# Patient Record
Sex: Female | Born: 1935 | Race: White | Hispanic: No | Marital: Married | Smoking: Never smoker
Health system: Southern US, Community
[De-identification: ages and names within clinical notes are randomized; demographics above are authoritative.]

## PROBLEM LIST (undated history)

## (undated) DIAGNOSIS — K5730 Diverticulosis of large intestine without perforation or abscess without bleeding: Secondary | ICD-10-CM

## (undated) DIAGNOSIS — M1990 Unspecified osteoarthritis, unspecified site: Secondary | ICD-10-CM

## (undated) DIAGNOSIS — S42402A Unspecified fracture of lower end of left humerus, initial encounter for closed fracture: Secondary | ICD-10-CM

## (undated) DIAGNOSIS — Z96652 Presence of left artificial knee joint: Secondary | ICD-10-CM

## (undated) DIAGNOSIS — L309 Dermatitis, unspecified: Secondary | ICD-10-CM

## (undated) DIAGNOSIS — I10 Essential (primary) hypertension: Secondary | ICD-10-CM

## (undated) DIAGNOSIS — S52022A Displaced fracture of olecranon process without intraarticular extension of left ulna, initial encounter for closed fracture: Secondary | ICD-10-CM

## (undated) DIAGNOSIS — M1712 Unilateral primary osteoarthritis, left knee: Secondary | ICD-10-CM

## (undated) HISTORY — DX: Essential (primary) hypertension: I10

## (undated) HISTORY — DX: Dermatitis, unspecified: L30.9

## (undated) HISTORY — DX: Presence of left artificial knee joint: Z96.652

## (undated) HISTORY — DX: Diverticulosis of large intestine without perforation or abscess without bleeding: K57.30

## (undated) HISTORY — DX: Unspecified osteoarthritis, unspecified site: M19.90

---

## 1978-04-01 HISTORY — PX: APPENDECTOMY: SHX54

## 1978-04-01 HISTORY — PX: ABDOMINAL HYSTERECTOMY: SHX81

## 1999-04-02 HISTORY — PX: KNEE ARTHROSCOPY: SHX127

## 2002-06-28 ENCOUNTER — Encounter: Payer: Self-pay | Admitting: Internal Medicine

## 2003-06-13 ENCOUNTER — Encounter: Payer: Self-pay | Admitting: Internal Medicine

## 2004-04-25 ENCOUNTER — Ambulatory Visit: Payer: Self-pay | Admitting: Internal Medicine

## 2005-05-16 ENCOUNTER — Ambulatory Visit: Payer: Self-pay | Admitting: Internal Medicine

## 2005-07-17 ENCOUNTER — Encounter: Payer: Self-pay | Admitting: Internal Medicine

## 2005-09-11 ENCOUNTER — Ambulatory Visit: Payer: Self-pay | Admitting: Internal Medicine

## 2006-04-23 ENCOUNTER — Ambulatory Visit: Payer: Self-pay | Admitting: Gastroenterology

## 2006-06-10 ENCOUNTER — Ambulatory Visit: Payer: Self-pay | Admitting: Gastroenterology

## 2006-06-10 ENCOUNTER — Encounter: Payer: Self-pay | Admitting: Internal Medicine

## 2006-06-10 LAB — HM COLONOSCOPY: HM Colonoscopy: NORMAL

## 2006-06-17 ENCOUNTER — Ambulatory Visit: Payer: Self-pay | Admitting: Internal Medicine

## 2006-09-16 ENCOUNTER — Ambulatory Visit: Payer: Self-pay | Admitting: Internal Medicine

## 2006-09-16 LAB — CONVERTED CEMR LAB
ALT: 21 units/L (ref 0–40)
AST: 26 units/L (ref 0–37)
Albumin: 4.1 g/dL (ref 3.5–5.2)
Alkaline Phosphatase: 43 units/L (ref 39–117)
BUN: 14 mg/dL (ref 6–23)
Basophils Absolute: 0 10*3/uL (ref 0.0–0.1)
Basophils Relative: 0.3 % (ref 0.0–1.0)
Bilirubin, Direct: 0.1 mg/dL (ref 0.0–0.3)
CO2: 33 meq/L — ABNORMAL HIGH (ref 19–32)
Calcium: 9.4 mg/dL (ref 8.4–10.5)
Chloride: 107 meq/L (ref 96–112)
Cholesterol: 256 mg/dL (ref 0–200)
Creatinine, Ser: 0.7 mg/dL (ref 0.4–1.2)
Direct LDL: 163 mg/dL
Eosinophils Absolute: 0.2 10*3/uL (ref 0.0–0.6)
Eosinophils Relative: 3 % (ref 0.0–5.0)
GFR calc Af Amer: 106 mL/min
GFR calc non Af Amer: 88 mL/min
Glucose, Bld: 91 mg/dL (ref 70–99)
HCT: 38.7 % (ref 36.0–46.0)
HDL: 69.2 mg/dL (ref >39.0)
Hemoglobin: 13.5 g/dL (ref 12.0–15.0)
Lymphocytes Relative: 34.3 % (ref 12.0–46.0)
MCHC: 34.8 g/dL (ref 30.0–36.0)
MCV: 93.3 fL (ref 78.0–100.0)
Monocytes Absolute: 0.5 10*3/uL (ref 0.2–0.7)
Monocytes Relative: 8.4 % (ref 3.0–11.0)
Neutro Abs: 3.2 10*3/uL (ref 1.4–7.7)
Neutrophils Relative %: 54 % (ref 43.0–77.0)
Platelets: 274 10*3/uL (ref 150–400)
Potassium: 3.8 meq/L (ref 3.5–5.1)
RBC: 4.15 M/uL (ref 3.87–5.11)
RDW: 13.5 % (ref 11.5–14.6)
Sodium: 143 meq/L (ref 135–145)
TSH: 1.14 microintl units/mL (ref 0.35–5.50)
Total Bilirubin: 0.4 mg/dL (ref 0.3–1.2)
Total CHOL/HDL Ratio: 3.7
Total Protein: 6.5 g/dL (ref 6.0–8.3)
Triglycerides: 100 mg/dL (ref 0–149)
VLDL: 20 mg/dL (ref 0–40)
Vit D, 1,25-Dihydroxy: 25 (ref 20–57)
WBC: 5.9 10*3/uL (ref 4.5–10.5)

## 2006-09-17 DIAGNOSIS — K5730 Diverticulosis of large intestine without perforation or abscess without bleeding: Secondary | ICD-10-CM | POA: Insufficient documentation

## 2006-11-18 ENCOUNTER — Ambulatory Visit: Payer: Self-pay | Admitting: Internal Medicine

## 2007-03-10 ENCOUNTER — Ambulatory Visit: Payer: Self-pay | Admitting: Internal Medicine

## 2007-03-10 LAB — CONVERTED CEMR LAB
Bilirubin Urine: NEGATIVE
Glucose, Urine, Semiquant: NEGATIVE
Ketones, urine, test strip: NEGATIVE
Nitrite: NEGATIVE
Specific Gravity, Urine: >=1.03
Urobilinogen, UA: 0.2
pH: 5

## 2007-03-11 ENCOUNTER — Encounter: Payer: Self-pay | Admitting: Internal Medicine

## 2007-04-01 ENCOUNTER — Telehealth: Payer: Self-pay | Admitting: Internal Medicine

## 2007-07-22 ENCOUNTER — Encounter: Payer: Self-pay | Admitting: Internal Medicine

## 2007-07-28 ENCOUNTER — Encounter: Payer: Self-pay | Admitting: Internal Medicine

## 2007-09-08 ENCOUNTER — Ambulatory Visit: Payer: Self-pay | Admitting: Internal Medicine

## 2007-09-08 LAB — CONVERTED CEMR LAB
Nitrite: POSITIVE
Protein, U semiquant: 100
Urobilinogen, UA: NEGATIVE
pH: 5

## 2007-09-24 ENCOUNTER — Ambulatory Visit: Payer: Self-pay | Admitting: Internal Medicine

## 2007-09-24 LAB — CONVERTED CEMR LAB
Blood in Urine, dipstick: NEGATIVE
Glucose, Urine, Semiquant: NEGATIVE
Ketones, urine, test strip: NEGATIVE
Protein, U semiquant: NEGATIVE
Urobilinogen, UA: 0.2

## 2007-09-25 LAB — CONVERTED CEMR LAB
ALT: 31 units/L (ref 0–35)
AST: 33 units/L (ref 0–37)
Albumin: 4 g/dL (ref 3.5–5.2)
Alkaline Phosphatase: 48 units/L (ref 39–117)
BUN: 16 mg/dL (ref 6–23)
Basophils Relative: 0.9 % (ref 0.0–1.0)
Bilirubin, Direct: 0.1 mg/dL (ref 0.0–0.3)
Creatinine, Ser: 0.8 mg/dL (ref 0.4–1.2)
Eosinophils Absolute: 0.2 10*3/uL (ref 0.0–0.7)
GFR calc Af Amer: 91 mL/min
GFR calc non Af Amer: 75 mL/min
Glucose, Bld: 90 mg/dL (ref 70–99)
Hemoglobin: 14 g/dL (ref 12.0–15.0)
Lymphocytes Relative: 33 % (ref 12.0–46.0)
Monocytes Relative: 8.6 % (ref 3.0–12.0)
Neutro Abs: 2.9 10*3/uL (ref 1.4–7.7)
Neutrophils Relative %: 53.7 % (ref 43.0–77.0)
RBC: 4.29 M/uL (ref 3.87–5.11)
Sodium: 140 meq/L (ref 135–145)
Total CHOL/HDL Ratio: 3.8
Triglycerides: 72 mg/dL (ref 0–149)
VLDL: 14 mg/dL (ref 0–40)

## 2007-09-28 ENCOUNTER — Telehealth: Payer: Self-pay | Admitting: Internal Medicine

## 2007-11-17 ENCOUNTER — Telehealth: Payer: Self-pay | Admitting: Internal Medicine

## 2007-12-14 ENCOUNTER — Encounter: Payer: Self-pay | Admitting: Internal Medicine

## 2008-02-16 ENCOUNTER — Ambulatory Visit: Payer: Self-pay | Admitting: Family Medicine

## 2008-03-01 ENCOUNTER — Telehealth: Payer: Self-pay | Admitting: Internal Medicine

## 2008-03-29 ENCOUNTER — Ambulatory Visit: Payer: Self-pay | Admitting: Internal Medicine

## 2008-03-29 DIAGNOSIS — M129 Arthropathy, unspecified: Secondary | ICD-10-CM | POA: Insufficient documentation

## 2008-03-29 LAB — CONVERTED CEMR LAB
Bilirubin Urine: NEGATIVE
Glucose, Urine, Semiquant: NEGATIVE
Protein, U semiquant: NEGATIVE
Urobilinogen, UA: 0.2

## 2008-04-01 HISTORY — PX: OTHER SURGICAL HISTORY: SHX169

## 2008-04-18 ENCOUNTER — Telehealth: Payer: Self-pay | Admitting: Internal Medicine

## 2008-07-12 ENCOUNTER — Telehealth (INDEPENDENT_AMBULATORY_CARE_PROVIDER_SITE_OTHER): Payer: Self-pay | Admitting: *Deleted

## 2008-08-17 ENCOUNTER — Encounter: Payer: Self-pay | Admitting: Internal Medicine

## 2008-09-15 ENCOUNTER — Telehealth (INDEPENDENT_AMBULATORY_CARE_PROVIDER_SITE_OTHER): Payer: Self-pay | Admitting: *Deleted

## 2008-09-16 ENCOUNTER — Ambulatory Visit: Payer: Self-pay | Admitting: Internal Medicine

## 2008-09-16 LAB — CONVERTED CEMR LAB
Albumin: 4.2 g/dL (ref 3.5–5.2)
Alkaline Phosphatase: 57 units/L (ref 39–117)
BUN: 17 mg/dL (ref 6–23)
Basophils Absolute: 0.1 10*3/uL (ref 0.0–0.1)
Basophils Relative: 1.7 % (ref 0.0–3.0)
Bilirubin, Direct: 0.2 mg/dL (ref 0.0–0.3)
CO2: 31 meq/L (ref 19–32)
Calcium: 9.4 mg/dL (ref 8.4–10.5)
Chloride: 104 meq/L (ref 96–112)
Direct LDL: 170.4 mg/dL
Eosinophils Absolute: 0.2 10*3/uL (ref 0.0–0.7)
Eosinophils Relative: 3.1 % (ref 0.0–5.0)
Glucose, Bld: 99 mg/dL (ref 70–99)
HCT: 40.7 % (ref 36.0–46.0)
Hemoglobin, Urine: NEGATIVE
Hemoglobin: 13.8 g/dL (ref 12.0–15.0)
MCV: 95.4 fL (ref 78.0–100.0)
Monocytes Absolute: 0.3 10*3/uL (ref 0.1–1.0)
Monocytes Relative: 5.3 % (ref 3.0–12.0)
Neutro Abs: 2.6 10*3/uL (ref 1.4–7.7)
Platelets: 254 10*3/uL (ref 150.0–400.0)
RBC: 4.27 M/uL (ref 3.87–5.11)
Sodium: 141 meq/L (ref 135–145)
TSH: 1.61 microintl units/mL (ref 0.35–5.50)
Total Bilirubin: 1.2 mg/dL (ref 0.3–1.2)
Urine Glucose: NEGATIVE mg/dL
pH: 6 (ref 5.0–8.0)

## 2008-09-23 ENCOUNTER — Ambulatory Visit: Payer: Self-pay | Admitting: Internal Medicine

## 2008-12-15 ENCOUNTER — Telehealth: Payer: Self-pay | Admitting: Internal Medicine

## 2008-12-20 ENCOUNTER — Ambulatory Visit: Payer: Self-pay | Admitting: Internal Medicine

## 2009-02-15 ENCOUNTER — Ambulatory Visit: Payer: Self-pay | Admitting: Internal Medicine

## 2009-02-15 LAB — CONVERTED CEMR LAB
Bilirubin Urine: NEGATIVE
Nitrite: NEGATIVE
Protein, U semiquant: NEGATIVE
Urobilinogen, UA: 0.2
pH: 5

## 2009-02-21 ENCOUNTER — Telehealth: Payer: Self-pay | Admitting: Internal Medicine

## 2009-02-21 ENCOUNTER — Ambulatory Visit: Payer: Self-pay | Admitting: Internal Medicine

## 2009-02-22 ENCOUNTER — Encounter: Payer: Self-pay | Admitting: Internal Medicine

## 2009-06-14 ENCOUNTER — Telehealth (INDEPENDENT_AMBULATORY_CARE_PROVIDER_SITE_OTHER): Payer: Self-pay | Admitting: *Deleted

## 2009-06-22 ENCOUNTER — Ambulatory Visit: Payer: Self-pay | Admitting: Family Medicine

## 2009-08-01 ENCOUNTER — Ambulatory Visit: Payer: Self-pay | Admitting: Internal Medicine

## 2009-08-01 DIAGNOSIS — I10 Essential (primary) hypertension: Secondary | ICD-10-CM | POA: Insufficient documentation

## 2009-08-22 ENCOUNTER — Encounter: Payer: Self-pay | Admitting: Internal Medicine

## 2009-10-05 ENCOUNTER — Ambulatory Visit: Payer: Self-pay | Admitting: Internal Medicine

## 2009-10-05 LAB — CONVERTED CEMR LAB
AST: 29 units/L (ref 0–37)
Albumin: 4.4 g/dL (ref 3.5–5.2)
Alkaline Phosphatase: 60 units/L (ref 39–117)
Basophils Relative: 0.8 % (ref 0.0–3.0)
Bilirubin Urine: NEGATIVE
Blood in Urine, dipstick: NEGATIVE
Calcium: 9.6 mg/dL (ref 8.4–10.5)
Creatinine, Ser: 0.7 mg/dL (ref 0.4–1.2)
Direct LDL: 170.9 mg/dL
Eosinophils Absolute: 0.3 10*3/uL (ref 0.0–0.7)
Eosinophils Relative: 6.1 % — ABNORMAL HIGH (ref 0.0–5.0)
Glucose, Urine, Semiquant: NEGATIVE
HCT: 39.3 % (ref 36.0–46.0)
HDL: 63.6 mg/dL (ref >39.00)
Ketones, urine, test strip: NEGATIVE
Lymphocytes Relative: 33.5 % (ref 12.0–46.0)
MCV: 97.5 fL (ref 78.0–100.0)
Monocytes Absolute: 0.5 10*3/uL (ref 0.1–1.0)
Neutro Abs: 2.8 10*3/uL (ref 1.4–7.7)
Nitrite: NEGATIVE
Potassium: 4.8 meq/L (ref 3.5–5.1)
Protein, U semiquant: NEGATIVE
RDW: 14.3 % (ref 11.5–14.6)
Sodium: 143 meq/L (ref 135–145)
Specific Gravity, Urine: 1.015
TSH: 1.64 microintl units/mL (ref 0.35–5.50)
Total Bilirubin: 0.7 mg/dL (ref 0.3–1.2)
Total CHOL/HDL Ratio: 4
Urobilinogen, UA: 0.2
WBC: 5.6 10*3/uL (ref 4.5–10.5)
pH: 5.5

## 2009-10-12 ENCOUNTER — Ambulatory Visit: Payer: Self-pay | Admitting: Internal Medicine

## 2009-10-25 ENCOUNTER — Encounter: Admission: RE | Admit: 2009-10-25 | Discharge: 2009-10-25 | Payer: Self-pay | Admitting: Orthopedic Surgery

## 2009-10-30 ENCOUNTER — Ambulatory Visit (HOSPITAL_BASED_OUTPATIENT_CLINIC_OR_DEPARTMENT_OTHER): Admission: RE | Admit: 2009-10-30 | Discharge: 2009-10-30 | Payer: Self-pay | Admitting: Orthopedic Surgery

## 2010-02-07 ENCOUNTER — Ambulatory Visit: Payer: Self-pay | Admitting: Internal Medicine

## 2010-02-12 ENCOUNTER — Encounter: Payer: Self-pay | Admitting: Internal Medicine

## 2010-04-12 ENCOUNTER — Ambulatory Visit
Admission: RE | Admit: 2010-04-12 | Discharge: 2010-04-12 | Payer: Self-pay | Source: Home / Self Care | Attending: Internal Medicine | Admitting: Internal Medicine

## 2010-04-12 DIAGNOSIS — R300 Dysuria: Secondary | ICD-10-CM | POA: Insufficient documentation

## 2010-04-13 ENCOUNTER — Encounter: Payer: Self-pay | Admitting: Internal Medicine

## 2010-05-01 NOTE — Progress Notes (Signed)
  Phone Note Call from Patient   Summary of Call: pt wants to know if the life line screening is necessary or is it included with cpx from md - when was her last cpx- ( it was 6-10) Initial call taken by: Willy Eddy, LPN,  June 14, 2009 9:09 AM  Follow-up for Phone Call        Per Dr. Lovell Sheehan.  Does not like this service.  Tests are not reliable, and they have to be repeated if postive anyway.  Pt notified. Follow-up by: Lynann Beaver CMA,  June 14, 2009 9:37 AM

## 2010-05-01 NOTE — Assessment & Plan Note (Signed)
Summary: flu shot/njr   Nurse Visit   Allergies: 1)  * Adhesive Oxytrol  Orders Added: 1)  Flu Vaccine 64yrs + MEDICARE PATIENTS [Q2039] 2)  Administration Flu vaccine - MCR [G0008] Flu Vaccine Consent Questions     Do you have a history of severe allergic reactions to this vaccine? no    Any prior history of allergic reactions to egg and/or gelatin? no    Do you have a sensitivity to the preservative Thimersol? no    Do you have a past history of Guillan-Barre Syndrome? no    Do you currently have an acute febrile illness? no    Have you ever had a severe reaction to latex? no    Vaccine information given and explained to patient? yes    Are you currently pregnant? no    Lot Number:[id number]   Exp Date:09/29/2010   Site Given  Left Deltoid [id number]

## 2010-05-01 NOTE — Assessment & Plan Note (Signed)
Summary: F/U ON HTN // RS   Vital Signs:  Patient profile:   77 year old female Weight:      160 pounds BMI:     26.32 Temp:     97.9 degrees F oral Pulse rate:   72 / minute Pulse rhythm:   regular Resp:     12 per minute BP sitting:   158 / 80  (left arm) Cuff size:   regular  Vitals Entered By: Gladis Riffle, RN (Aug 01, 2009 10:35 AM) CC: FU HTN--does not check at home Is Patient Diabetic? No   CC:  FU HTN--does not check at home.  History of Present Illness: not monitoring BP she has no compliants no meds  had knee injection this a.m. (knee pain)  All other systems reviewed and were negative   Preventive Screening-Counseling & Management  Alcohol-Tobacco     Alcohol drinks/day: <1     Smoking Status: never  Current Medications (verified): 1)  Aspir-81 81 Mg Tbec (Aspirin) .... Take 1 Tablet By Mouth Once A Day 2)  Vesicare 5 Mg Tabs (Solifenacin Succinate) .Marland Kitchen.. 1 By Mouth Once Daily 3)  Methotrexate 2.5 Mg Tabs (Methotrexate Sodium) .... Take 2 Tablet By Mouth Three Times A Week 4)  Nasacort Aq 55 Mcg/act Aers (Triamcinolone Acetonide(Nasal)) .... Prn 5)  Premarin 0.3 Mg Tabs (Estrogens Conjugated) .Marland Kitchen.. 1 By Mouth Once Daily 6)  Naproxen Dr 375 Mg  Tbec (Naproxen) .Marland Kitchen.. 1 By Mouth Once Daily As Needed 7)  Omeprazole 40 Mg Cpdr (Omeprazole) .... Take 1 Tablet By Mouth Once A Day 8)  Caltrate 600+d 600-400 Mg-Unit Tabs (Calcium Carbonate-Vitamin D) .... Two Times A Day  Allergies: 1)  * Adhesive Oxytrol  Past History:  Past Surgical History: Last updated: 09/17/2006 Appendectomy 1980 Hysterectomy, BSO  1980 arthroscopy L 2005  Family History: Last updated: 11/18/2006 father---MI---82 yo mother MI---90 yo  Social History: Last updated: 03/29/2008 Married Never Smoked Alcohol use-yes Regular exercise-yes    Risk Factors: Alcohol Use: <1 (08/01/2009) Exercise: yes (11/18/2006)  Risk Factors: Smoking Status: never (08/01/2009)  Past Medical  History: Diverticulosis, colon eczema  psoriasis  on MTX  OA  Hypertension (dx 2011)  Review of Systems       All other systems reviewed and were negative   Physical Exam  General:  alert and well-developed.   Head:  normocephalic and atraumatic.   Eyes:  pupils equal and pupils round.   Neck:  No deformities, masses, or tenderness noted. Lungs:  Normal respiratory effort, chest expands symmetrically. Lungs are clear to auscultation, no crackles or wheezes. Heart:  normal rate and regular rhythm.   Abdomen:  no cva or suprapubic discomfort Msk:  No deformity or scoliosis noted of thoracic or lumbar spine.   Neurologic:  cranial nerves II-XII intact and gait normal.     Impression & Recommendations:  Problem # 1:  ELEVATED BLOOD PRESSURE (ICD-796.2)  no meds currently start lisinopril  side effects discussed BP today: 158/80 Prior BP: 160/80 (06/22/2009)  Labs Reviewed: Creat: 0.7 (09/16/2008) Chol: 242 (09/16/2008)   HDL: 64.50 (09/16/2008)   LDL: DEL (09/24/2007)   TG: 56.0 (09/16/2008)  Instructed in low sodium diet (DASH Handout) and behavior modification.    Her updated medication list for this problem includes:    Lisinopril 10 Mg Tabs (Lisinopril) .Marland Kitchen... Take 1 tab by mouth daily  Complete Medication List: 1)  Aspir-81 81 Mg Tbec (Aspirin) .... Take 1 tablet by mouth once a day 2)  Vesicare 5 Mg Tabs (Solifenacin succinate) .Marland Kitchen.. 1 by mouth once daily 3)  Methotrexate 2.5 Mg Tabs (Methotrexate sodium) .... Take 2 tablet by mouth three times a week 4)  Nasacort Aq 55 Mcg/act Aers (Triamcinolone acetonide(nasal)) .... Prn 5)  Premarin 0.3 Mg Tabs (Estrogens conjugated) .Marland Kitchen.. 1 by mouth once daily 6)  Naproxen Dr 375 Mg Tbec (Naproxen) .Marland Kitchen.. 1 by mouth once daily as needed 7)  Omeprazole 40 Mg Cpdr (Omeprazole) .... Take 1 tablet by mouth once a day 8)  Caltrate 600+d 600-400 Mg-unit Tabs (Calcium carbonate-vitamin d) .... Two times a day 9)  Lisinopril 10 Mg Tabs  (Lisinopril) .... Take 1 tab by mouth daily  Patient Instructions: 1)  see me mid-july--CPX Prescriptions: LISINOPRIL 10 MG  TABS (LISINOPRIL) Take 1 tab by mouth daily  #90 x 3   Entered and Authorized by:   Birdie Sons MD   Signed by:   Birdie Sons MD on 08/01/2009   Method used:   Electronically to        [facility] [id number] (retail)       [address] [place], [place]  [address]       Ph: [phone]       Fax: [phone]   RxID:   [id number]   Appended Document: F/U ON HTN // RS pt has new dx HTN

## 2010-05-01 NOTE — Assessment & Plan Note (Signed)
Summary: 30 min ov/njr   Vital Signs:  Patient profile:   77 year old female Height:      65.5 inches (166.37 cm) Weight:      157.31 pounds (71.50 kg) Temp:     98.3 degrees F (36.83 degrees C) oral Pulse rate:   76 / minute BP sitting:   142 / 82  (left arm) Cuff size:   regular  Vitals Entered By: Josph Macho RMA (October 12, 2009 9:28 AM) CC: Office visit/ CF Is Patient Diabetic? No   CC:  Office visit/ CF.  History of Present Illness:  Follow-Up Visit      This is a 77 year old woman who presents for Follow-up visit.  The patient denies chest pain and palpitations.  Since the last visit the patient notes no new problems or concerns (except eval for right knee pain---[place]) and no problems with medications.  The patient reports taking meds as prescribed.  When questioned about possible medication side effects, the patient notes none.    All other systems reviewed and were negative   Current Problems (verified): 1)  Hypertension  (ICD-401.9) 2)  Elevated Blood Pressure  (ICD-796.2) 3)  Vitamin D Deficiency  (ICD-268.9) 4)  Arthritis  (ICD-716.90) 5)  Psoriasis On Mtx  (ICD-696.1) 6)  Insomnia-sleep Disorder-unspec  (ICD-780.52) 7)  Preventive Health Care  (ICD-V70.0) 8)  Diverticulosis, Colon  (ICD-562.10)  Current Medications (verified): 1)  Aspir-81 81 Mg Tbec (Aspirin) .... Take 1 Tablet By Mouth Once A Day 2)  Vesicare 5 Mg Tabs (Solifenacin Succinate) .Marland Kitchen.. 1 By Mouth Once Daily 3)  Methotrexate 2.5 Mg Tabs (Methotrexate Sodium) .... Take 2 Tablet By Mouth Three Times A Week 4)  Nasacort Aq 55 Mcg/act Aers (Triamcinolone Acetonide(Nasal)) .... Prn 5)  Premarin 0.3 Mg Tabs (Estrogens Conjugated) .Marland Kitchen.. 1 By Mouth Once Daily 6)  Naproxen Dr 375 Mg  Tbec (Naproxen) .Marland Kitchen.. 1 By Mouth Once Daily As Needed 7)  Omeprazole 40 Mg Cpdr (Omeprazole) .... Take 1 Tablet By Mouth Once A Day 8)  Caltrate 600+d 600-400 Mg-Unit Tabs (Calcium Carbonate-Vitamin D) .... Two Times A  Day 9)  Lisinopril 10 Mg  Tabs (Lisinopril) .... Take 1 Tab By Mouth Daily  Allergies (verified): 1)  * Adhesive Oxytrol  Past History:  Past Medical History: Last updated: 08/01/2009 Diverticulosis, colon eczema  psoriasis  on MTX  OA  Hypertension (dx 2011)  Past Surgical History: Last updated: 09/17/2006 Appendectomy 1980 Hysterectomy, BSO  1980 arthroscopy L 2005  Family History: Last updated: 11/18/2006 father---MI---82 yo mother MI---90 yo  Social History: Last updated: 03/29/2008 Married Never Smoked Alcohol use-yes Regular exercise-yes    Risk Factors: Alcohol Use: <1 (08/01/2009) Exercise: yes (11/18/2006)  Risk Factors: Smoking Status: never (08/01/2009)  Physical Exam  General:  alert and well-developed.   Head:  normocephalic and atraumatic.   Eyes:  pupils equal and pupils round.   Ears:  R ear normal and L ear normal.   Neck:  No deformities, masses, or tenderness noted. Lungs:  normal respiratory effort and no intercostal retractions.   Heart:  normal rate and regular rhythm.   Abdomen:  no cva or suprapubic discomfort Msk:  No deformity or scoliosis noted of thoracic or lumbar spine.   Neurologic:  cranial nerves II-XII intact and gait normal.     Impression & Recommendations:  Problem # 1:  HYPERTENSION (ICD-401.9) adequate control continue current medications  Her updated medication list for this problem includes:    Lisinopril  10 Mg Tabs (Lisinopril) .Marland Kitchen... Take 1 tab by mouth daily  BP today: 142/82 Prior BP: 158/80 (08/01/2009)  Labs Reviewed: K+: 4.8 (10/05/2009) Creat: : 0.7 (10/05/2009)   Chol: 250 (10/05/2009)   HDL: 63.60 (10/05/2009)   LDL: DEL (09/24/2007)   TG: 115.0 (10/05/2009)  Problem # 2:  VITAMIN D DEFICIENCY (ICD-268.9)  Problem # 3:  PSORIASIS ON MTX (ICD-696.1) follwed by dermatology (frank [place])  Problem # 4:  ARTHRITIS (ICD-716.90) stable  Complete Medication List: 1)  Aspir-81 81 Mg Tbec  (Aspirin) .... Take 1 tablet by mouth once a day 2)  Vesicare 5 Mg Tabs (Solifenacin succinate) .Marland Kitchen.. 1 by mouth once daily 3)  Methotrexate 2.5 Mg Tabs (Methotrexate sodium) .... Take 2 tablet by mouth three times a week 4)  Nasacort Aq 55 Mcg/act Aers (Triamcinolone acetonide(nasal)) .... Prn 5)  Premarin 0.3 Mg Tabs (Estrogens conjugated) .Marland Kitchen.. 1 by mouth once daily 6)  Naproxen Dr 375 Mg Tbec (Naproxen) .Marland Kitchen.. 1 by mouth once daily as needed 7)  Omeprazole 40 Mg Cpdr (Omeprazole) .... Take 1 tablet by mouth once a day 8)  Caltrate 600+d 600-400 Mg-unit Tabs (Calcium carbonate-vitamin d) .... Two times a day 9)  Lisinopril 10 Mg Tabs (Lisinopril) .... Take 1 tab by mouth daily  Patient Instructions: 1)  Please schedule a follow-up appointment in 6 months.

## 2010-05-01 NOTE — Assessment & Plan Note (Signed)
Summary: possible ear infection - rv   Vital Signs:  Patient profile:   77 year old female Temp:     98.2 degrees F oral BP sitting:   160 / 80  (left arm) Cuff size:   regular  Vitals Entered By: Sid Falcon LPN (June 22, 2009 4:06 PM) CC: Possible right ear infection   History of Present Illness: R ear drainage for about 6 days.  Yellow tinged,  no fever.  T tube about 2 years ago and this was eventually removed.  Mild nasal congestion.  No vertigo or dizziness.  Hx mildly elev BP-never treated.  No recent headaches, palpitations, chest pain.  Allergies: 1)  * Adhesive Oxytrol  Past History:  Past Medical History: Last updated: 03/29/2008 Diverticulosis, colon eczema  psoriasis  on MTX  OA  PMH reviewed for relevance  Review of Systems  The patient denies anorexia, fever, chest pain, syncope, dyspnea on exertion, peripheral edema, and headaches.         chronic hearing loss with hearing aids.  Physical Exam  General:  Well-developed,well-nourished,in no acute distress; alert,appropriate and cooperative throughout examination Head:  Normocephalic and atraumatic without obvious abnormalities. No apparent alopecia or balding. Ears:  yellow tinged mostly thin drainage R canal with evidence for ?chronic perforation.  Distorted landmarks.  L TM is normal. Nose:  External nasal examination shows no deformity or inflammation. Nasal mucosa are pink and moist without lesions or exudates. Mouth:  Oral mucosa and oropharynx without lesions or exudates.  Teeth in good repair. Neck:  No deformities, masses, or tenderness noted. Lungs:  Normal respiratory effort, chest expands symmetrically. Lungs are clear to auscultation, no crackles or wheezes.   Impression & Recommendations:  Problem # 1:  OTITIS MEDIA, PURULENT, ACUTE (ICD-382.00) keep ear dry.  Start abs.  Pt has f/u with ENT next week. Her updated medication list for this problem includes:    Aspir-81 81 Mg Tbec  (Aspirin) .Marland Kitchen... Take 1 tablet by mouth once a day    Naproxen Dr 375 Mg Tbec (Naproxen) .Marland Kitchen... 1 by mouth once daily as needed    Amoxicillin-pot Clavulanate 875-125 Mg Tabs (Amoxicillin-pot clavulanate) ..... One by mouth two times a day with food  Problem # 2:  ELEVATED BLOOD PRESSURE (ICD-796.2) schedule f/u with primary.  Complete Medication List: 1)  Aspir-81 81 Mg Tbec (Aspirin) .... Take 1 tablet by mouth once a day 2)  Vesicare 5 Mg Tabs (Solifenacin succinate) .Marland Kitchen.. 1 by mouth once daily 3)  Methotrexate 2.5 Mg Tabs (Methotrexate sodium) .... Take 2 tablet by mouth three times a week 4)  Nasacort Aq 55 Mcg/act Aers (Triamcinolone acetonide(nasal)) .... Prn 5)  Premarin 0.3 Mg Tabs (Estrogens conjugated) .Marland Kitchen.. 1 by mouth once daily 6)  Naproxen Dr 375 Mg Tbec (Naproxen) .Marland Kitchen.. 1 by mouth once daily as needed 7)  Omeprazole 40 Mg Cpdr (Omeprazole) .... Take 1 tablet by mouth once a day 8)  Caltrate 600+d 600-400 Mg-unit Tabs (Calcium carbonate-vitamin d) .... Two times a day 9)  Transderm-scop 1.5 Mg Pt72 (Scopolamine base) .... Apply q 3 days 10)  Amoxicillin-pot Clavulanate 875-125 Mg Tabs (Amoxicillin-pot clavulanate) .... One by mouth two times a day with food  Patient Instructions: 1)  Take your antibiotic as prescribed until ALL of it is gone, but stop if you develop a rash or swelling and contact our office as soon as possible.  2)  Keep ear dry. 3)  Schedule follow up with Dr Cato Mulligan within one month.  Prescriptions: AMOXICILLIN-POT CLAVULANATE 875-125 MG TABS (AMOXICILLIN-POT CLAVULANATE) one by mouth two times a day with food  #20 x 0   Entered and Authorized by:   Evelena Peat MD   Signed by:   Evelena Peat MD on 06/22/2009   Method used:   Electronically to        [facility] [id number] (retail)       [address] [place], [place]  [address]       Ph: [phone]       Fax: [phone]   RxID:   [id number]

## 2010-05-01 NOTE — Op Note (Signed)
Summary: Transnasal flexible laryngoscopy and stroboscopy/[facility]  Transnasal flexible laryngoscopy and stroboscopy/[facility]   Imported By: Maryln Gottron 02/28/2010 12:17:03  _____________________________________________________________________  External Attachment:    Type:   Image     Comment:   External Document

## 2010-05-03 NOTE — Assessment & Plan Note (Signed)
Summary: 6 month fup//ccm   Vital Signs:  Patient profile:   77 year old female Weight:      158 pounds Temp:     98.4 degrees F oral Pulse rate:   80 / minute Pulse rhythm:   regular BP sitting:   156 / 82  (left arm) Cuff size:   regular  Vitals Entered By: Alfred Levins, CMA (April 12, 2010 9:37 AM)  Serial Vital Signs/Assessments:  Time      Position  BP       Pulse  Resp  Temp     By                     140/70                         Birdie Sons MD  CC: bp check   CC:  bp check.  History of Present Illness: htn--tolerating meds she has no related sxs no home BP checks  ROS: no Chest pain, SOB, pnd, no neurologic sxs  Current Problems (verified): 1)  Dysuria  (ICD-788.1) 2)  Hypertension  (ICD-401.9) 3)  Arthritis  (ICD-716.90) 4)  Psoriasis On Mtx  (ICD-696.1) 5)  Preventive Health Care  (ICD-V70.0) 6)  Diverticulosis, Colon  (ICD-562.10)  Current Medications (verified): 1)  Aspir-81 81 Mg Tbec (Aspirin) .... Take 1 Tablet By Mouth Once A Day 2)  Vesicare 5 Mg Tabs (Solifenacin Succinate) .Marland Kitchen.. 1 By Mouth Once Daily 3)  Methotrexate 2.5 Mg Tabs (Methotrexate Sodium) .... Take 2 Tablet By Mouth Three Times A Week 4)  Nasacort Aq 55 Mcg/act Aers (Triamcinolone Acetonide(Nasal)) .... Prn 5)  Premarin 0.3 Mg Tabs (Estrogens Conjugated) .Marland Kitchen.. 1 By Mouth Once Daily 6)  Naproxen Dr 375 Mg  Tbec (Naproxen) .Marland Kitchen.. 1 By Mouth Once Daily As Needed 7)  Omeprazole 40 Mg Cpdr (Omeprazole) .... Take 1 Tablet By Mouth Once A Day 8)  Caltrate 600+d 600-400 Mg-Unit Tabs (Calcium Carbonate-Vitamin D) .... Two Times A Day 9)  Lisinopril 10 Mg  Tabs (Lisinopril) .... Take 1 Tab By Mouth Daily  Allergies (verified): 1)  * Adhesive Oxytrol  Past History:  Past Medical History: Last updated: 08/01/2009 Diverticulosis, colon eczema  psoriasis  on MTX  OA  Hypertension (dx 2011)  Past Surgical History: Last updated: 09/17/2006 Appendectomy 1980 Hysterectomy, BSO   1980 arthroscopy L 2005  Family History: Last updated: 11/18/2006 father---MI---82 yo mother MI---90 yo  Social History: Last updated: 03/29/2008 Married Never Smoked Alcohol use-yes Regular exercise-yes    Risk Factors: Alcohol Use: <1 (08/01/2009) Exercise: yes (11/18/2006)  Risk Factors: Smoking Status: never (08/01/2009)  Physical Exam  General:  alert and well-developed.   Head:  normocephalic and atraumatic.   Neck:  No deformities, masses, or tenderness noted. Lungs:  normal respiratory effort and no intercostal retractions.   Heart:  normal rate and regular rhythm.     Impression & Recommendations:  Problem # 1:  HYPERTENSION (ICD-401.9)  ? control-- monitor BP at home Her updated medication list for this problem includes:    Lisinopril 10 Mg Tabs (Lisinopril) .Marland Kitchen... Take 1 tab by mouth daily  BP today: 156/82 Prior BP: 142/82 (10/12/2009)  Labs Reviewed: K+: 4.8 (10/05/2009) Creat: : 0.7 (10/05/2009)   Chol: 250 (10/05/2009)   HDL: 63.60 (10/05/2009)   LDL: DEL (09/24/2007)   TG: 115.0 (10/05/2009)  Problem # 2:  VITAMIN D DEFICIENCY (ICD-268.9) Assessment: Unchanged  Complete Medication List: 1)  Aspir-81 81 Mg Tbec (Aspirin) .... Take 1 tablet by mouth once a day 2)  Vesicare 5 Mg Tabs (Solifenacin succinate) .Marland Kitchen.. 1 by mouth once daily 3)  Methotrexate 2.5 Mg Tabs (Methotrexate sodium) .... Take 2 tablet by mouth three times a week 4)  Nasacort Aq 55 Mcg/act Aers (Triamcinolone acetonide(nasal)) .... Prn 5)  Premarin 0.3 Mg Tabs (Estrogens conjugated) .Marland Kitchen.. 1 by mouth once daily 6)  Naproxen Dr 375 Mg Tbec (Naproxen) .Marland Kitchen.. 1 by mouth once daily as needed 7)  Omeprazole 40 Mg Cpdr (Omeprazole) .... Take 1 tablet by mouth once a day 8)  Caltrate 600+d 600-400 Mg-unit Tabs (Calcium carbonate-vitamin d) .... Two times a day 9)  Lisinopril 10 Mg Tabs (Lisinopril) .... Take 1 tab by mouth daily  Other Orders: T-Culture, Urine ([id number]) Specimen  Handling ([id number]) Urology Referral (Urology)  Patient Instructions: 1)  Please schedule a follow-up appointment in 6 months. 2)   monitor bp at home...goal BP<135/85   Orders Added: 1)  T-Culture, Urine [[id number]] 2)  Specimen Handling [[id number]] 3)  Urology Referral [Urology] 4)  Est. Patient Level III [02725]

## 2010-05-21 ENCOUNTER — Other Ambulatory Visit: Payer: Self-pay | Admitting: Internal Medicine

## 2010-05-28 ENCOUNTER — Other Ambulatory Visit: Payer: Self-pay | Admitting: Internal Medicine

## 2010-06-07 ENCOUNTER — Telehealth: Payer: Self-pay | Admitting: Internal Medicine

## 2010-06-07 NOTE — Telephone Encounter (Signed)
Pt aware of readings

## 2010-06-07 NOTE — Telephone Encounter (Signed)
Pt called and said that she needs to get the lvl of last total cholesterol reading she had done. Pt needs this info asap. Pls call and pt said to leave a detailed message, if she is not available.

## 2010-06-15 LAB — POCT HEMOGLOBIN-HEMACUE: Hemoglobin: 14.1 g/dL (ref 12.0–15.0)

## 2010-06-16 LAB — BASIC METABOLIC PANEL
CO2: 29 mEq/L (ref 19–32)
Chloride: 106 mEq/L (ref 96–112)
GFR calc non Af Amer: >60 mL/min (ref >60)
Glucose, Bld: 97 mg/dL (ref 70–99)
Potassium: 4.4 mEq/L (ref 3.5–5.1)
Sodium: 143 mEq/L (ref 135–145)

## 2010-07-25 ENCOUNTER — Other Ambulatory Visit: Payer: Self-pay | Admitting: Internal Medicine

## 2010-08-31 ENCOUNTER — Encounter: Payer: Self-pay | Admitting: Internal Medicine

## 2010-09-07 ENCOUNTER — Encounter: Payer: Self-pay | Admitting: Internal Medicine

## 2010-10-12 ENCOUNTER — Ambulatory Visit: Payer: Self-pay | Admitting: Internal Medicine

## 2010-10-31 ENCOUNTER — Ambulatory Visit: Payer: Self-pay | Admitting: Internal Medicine

## 2010-11-02 ENCOUNTER — Encounter: Payer: Self-pay | Admitting: Internal Medicine

## 2010-11-05 ENCOUNTER — Encounter: Payer: Self-pay | Admitting: Internal Medicine

## 2010-11-05 ENCOUNTER — Ambulatory Visit (INDEPENDENT_AMBULATORY_CARE_PROVIDER_SITE_OTHER): Payer: Medicare Other | Admitting: Internal Medicine

## 2010-11-05 DIAGNOSIS — L259 Unspecified contact dermatitis, unspecified cause: Secondary | ICD-10-CM

## 2010-11-05 DIAGNOSIS — L309 Dermatitis, unspecified: Secondary | ICD-10-CM

## 2010-11-05 DIAGNOSIS — I10 Essential (primary) hypertension: Secondary | ICD-10-CM

## 2010-11-05 LAB — BASIC METABOLIC PANEL
BUN: 17 mg/dL (ref 6–23)
CO2: 27 mEq/L (ref 19–32)
GFR: 67.3 mL/min (ref >60.00)
Glucose, Bld: 87 mg/dL (ref 70–99)
Potassium: 4.1 mEq/L (ref 3.5–5.1)

## 2010-11-05 LAB — HEPATIC FUNCTION PANEL
AST: 31 U/L (ref 0–37)
Alkaline Phosphatase: 58 U/L (ref 39–117)

## 2010-11-05 NOTE — Progress Notes (Signed)
  Subjective:    Patient ID: Shannon Mcintyre, female    DOB: Dec 08, 1932, 77 y.o.   MRN: [id number]  HPI  htn---here for f/u  uti--now on macrodantin (urology)  GERD---tolerating omeprazole  Past Medical History  Diagnosis Date  . Diverticulosis of colon   . Eczema   . Arthritis   . Hypertension    Past Surgical History  Procedure Date  . Appendectomy 1980  . Abdominal hysterectomy 1980    BSO  . Knee arthroscopy 2005    left    reports that she has never smoked. She does not have any smokeless tobacco history on file. She reports that she drinks alcohol. Her drug history not on file. family history includes Heart attack in her father and mother. No Known Allergies   Review of Systems  patient denies chest pain, shortness of breath, orthopnea. Denies lower extremity edema, abdominal pain, change in appetite, change in bowel movements. Patient denies rashes, musculoskeletal complaints. No other specific complaints in a complete review of systems.      Objective:   Physical Exam  Well-developed well-nourished female in no acute distress. HEENT exam atraumatic, normocephalic, extraocular muscles are intact. Neck is supple. No jugular venous distention no thyromegaly. Chest clear to auscultation without increased work of breathing. Cardiac exam S1 and S2 are regular. Abdominal exam active bowel sounds, soft, nontender.       Assessment & Plan:

## 2010-11-05 NOTE — Assessment & Plan Note (Signed)
Well controlled Continue meds 

## 2010-11-14 ENCOUNTER — Other Ambulatory Visit: Payer: Self-pay | Admitting: Internal Medicine

## 2010-11-20 ENCOUNTER — Other Ambulatory Visit: Payer: Self-pay | Admitting: Internal Medicine

## 2011-01-10 ENCOUNTER — Ambulatory Visit: Payer: Medicare Other | Admitting: Family Medicine

## 2011-04-29 DIAGNOSIS — Z79899 Other long term (current) drug therapy: Secondary | ICD-10-CM | POA: Diagnosis not present

## 2011-04-29 DIAGNOSIS — L408 Other psoriasis: Secondary | ICD-10-CM | POA: Diagnosis not present

## 2011-04-30 ENCOUNTER — Other Ambulatory Visit (HOSPITAL_COMMUNITY): Payer: Self-pay | Admitting: Dermatology

## 2011-04-30 DIAGNOSIS — L409 Psoriasis, unspecified: Secondary | ICD-10-CM

## 2011-05-01 ENCOUNTER — Other Ambulatory Visit: Payer: Self-pay | Admitting: Oncology

## 2011-05-01 ENCOUNTER — Other Ambulatory Visit: Payer: Self-pay | Admitting: Radiology

## 2011-05-03 ENCOUNTER — Telehealth (HOSPITAL_COMMUNITY): Payer: Self-pay | Admitting: Pharmacy Technician

## 2011-05-06 ENCOUNTER — Ambulatory Visit (HOSPITAL_COMMUNITY)
Admission: RE | Admit: 2011-05-06 | Discharge: 2011-05-06 | Disposition: A | Discharge status: Home / Self Care | Payer: Medicare Other | Source: Ambulatory Visit | Attending: Dermatology | Admitting: Dermatology

## 2011-05-06 ENCOUNTER — Encounter (HOSPITAL_COMMUNITY): Payer: Self-pay

## 2011-05-06 DIAGNOSIS — K759 Inflammatory liver disease, unspecified: Secondary | ICD-10-CM | POA: Diagnosis not present

## 2011-05-06 DIAGNOSIS — Z7982 Long term (current) use of aspirin: Secondary | ICD-10-CM | POA: Insufficient documentation

## 2011-05-06 DIAGNOSIS — L409 Psoriasis, unspecified: Secondary | ICD-10-CM

## 2011-05-06 DIAGNOSIS — Z79899 Other long term (current) drug therapy: Secondary | ICD-10-CM | POA: Insufficient documentation

## 2011-05-06 DIAGNOSIS — L408 Other psoriasis: Secondary | ICD-10-CM | POA: Diagnosis not present

## 2011-05-06 DIAGNOSIS — I10 Essential (primary) hypertension: Secondary | ICD-10-CM | POA: Insufficient documentation

## 2011-05-06 LAB — CBC
HCT: 38.5 % (ref 36.0–46.0)
MCV: 89.5 fL (ref 78.0–100.0)
RDW: 12.3 % (ref 11.5–15.5)
WBC: 5.7 10*3/uL (ref 4.0–10.5)

## 2011-05-06 MED ORDER — FENTANYL CITRATE 0.05 MG/ML IJ SOLN
INTRAMUSCULAR | Status: AC
  Filled 2011-05-06: qty 4

## 2011-05-06 MED ORDER — MIDAZOLAM HCL 2 MG/2ML IJ SOLN
INTRAMUSCULAR | Status: AC
  Filled 2011-05-06: qty 6

## 2011-05-06 MED ORDER — FENTANYL CITRATE 0.05 MG/ML IJ SOLN
INTRAMUSCULAR | Status: AC
  Filled 2011-05-06: qty 6

## 2011-05-06 MED ORDER — SODIUM CHLORIDE 0.9 % IV SOLN
Freq: Once | INTRAVENOUS | Status: AC
  Administered 2011-05-06: 09:00:00 via INTRAVENOUS

## 2011-05-06 MED ORDER — MIDAZOLAM HCL 5 MG/5ML IJ SOLN
INTRAMUSCULAR | Status: AC | PRN
  Administered 2011-05-06: 1 mg via INTRAVENOUS

## 2011-05-06 MED ORDER — SODIUM CHLORIDE 0.9 % IV SOLN: INTRAVENOUS | Status: DC

## 2011-05-06 MED ORDER — FENTANYL CITRATE 0.05 MG/ML IJ SOLN
INTRAMUSCULAR | Status: AC | PRN
  Administered 2011-05-06: 50 ug via INTRAVENOUS

## 2011-05-06 NOTE — H&P (Signed)
Shannon Mcintyre is an 78 y.o. female.   Chief Complaint: I'm here for a liver biopsy" HPI: Patient with history of psoriasis on methotrexate therapy presents today for US guided random liver biopsy.  Past Medical History  Diagnosis Date  . Diverticulosis of colon   . Eczema   . Arthritis   . Hypertension   psoriasis  Past Surgical History  Procedure Date  . Appendectomy 1980  . Abdominal hysterectomy 1980    BSO  . Knee arthroscopy 2001    left  . Knee arthoscopy 2010    right    Family History  Problem Relation Age of Onset  . Heart attack Mother   . Heart attack Father    Social History:  reports that she has never smoked. She does not have any smokeless tobacco history on file. She reports that she drinks alcohol. Her drug history not on file.  Allergies: No Known Allergies  Medications Prior to Admission  Medication Sig Dispense Refill  . aspirin 81 MG tablet Take 81 mg by mouth daily.        . diphenhydrAMINE (BENADRYL) 25 MG tablet Take 25 mg by mouth at bedtime.      Marland Kitchen lisinopril (PRINIVIL,ZESTRIL) 10 MG tablet TAKE 1 TABLET BY MOUTH ONCE A DAY  90 tablet  3  . omeprazole (PRILOSEC) 40 MG capsule Take 40 mg by mouth daily.      Marland Kitchen PREMARIN 0.3 MG tablet TAKE 1 TABLET BY MOUTH ONCE A DAY  30 tablet  5  . sodium chloride (OCEAN) 0.65 % nasal spray Place 1 spray into the nose as needed. For dryness.      . trimethoprim (TRIMPEX) 100 MG tablet Take 100 mg by mouth at bedtime.      . VESICARE 5 MG tablet TAKE 1 TABLET BY MOUTH ONCE A DAY  30 tablet  5  . methotrexate (RHEUMATREX) 2.5 MG tablet Take 5 mg by mouth 3 (three) times a week. Caution:Chemotherapy. Protect from light.        Medications Prior to Admission  Medication Dose Route Frequency Provider Last Rate Last Dose  . 0.9 %  sodium chloride infusion   Intravenous Once Robet Leu, PA 20 mL/hr at 05/06/11 0981      Results for orders placed during the hospital encounter of 05/06/11 (from the past 48  hour(s))  APTT     Status: Normal   Collection Time   05/06/11  8:45 AM      Component Value Range Comment   aPTT 30  24 - 37 (seconds)   CBC     Status: Normal   Collection Time   05/06/11  8:45 AM      Component Value Range Comment   WBC 5.7  4.0 - 10.5 (K/uL)    RBC 4.30  3.87 - 5.11 (MIL/uL)    Hemoglobin 13.7  12.0 - 15.0 (g/dL)    HCT 19.1  47.8 - 29.5 (%)    MCV 89.5  78.0 - 100.0 (fL)    MCH 31.9  26.0 - 34.0 (pg)    MCHC 35.6  30.0 - 36.0 (g/dL)    RDW 62.1  30.8 - 65.7 (%)    Platelets 178  150 - 400 (K/uL)   PROTIME-INR     Status: Normal   Collection Time   05/06/11  8:45 AM      Component Value Range Comment   Prothrombin Time 13.5  11.6 - 15.2 (seconds)  INR 1.01  0.00 - 1.49     No results found.  Review of Systems  Constitutional: Negative for fever and chills.  HENT: Positive for hearing loss.   Respiratory: Negative for cough and shortness of breath.   Cardiovascular: Negative for chest pain.  Gastrointestinal: Negative for nausea, vomiting and abdominal pain.  Neurological: Negative for headaches.  Endo/Heme/Allergies: Does not bruise/bleed easily.    Blood pressure 135/50, pulse 76, temperature 97.1 F (36.2 C), temperature source Oral, resp. rate 18, height 5\' 6"  (1.676 m), weight 152 lb (68.947 kg), SpO2 100.00%. Physical Exam  Constitutional: She is oriented to person, place, and time. She appears well-developed and well-nourished.  Cardiovascular: Normal rate and regular rhythm.   Respiratory: Effort normal and breath sounds normal.  GI: Soft. Bowel sounds are normal. She exhibits no distension. There is no tenderness.  Musculoskeletal: Normal range of motion. She exhibits no edema.  Neurological: She is alert and oriented to person, place, and time.     Assessment/Plan Patient with psoriasis on methotrexate therapy; plan is for US guided random liver biopsy.  Amy Gothard,D KEVIN 05/06/2011, 9:29 AM

## 2011-05-06 NOTE — Procedures (Signed)
US guided random core biopsy right lobe liver performed x2 via 18 gauge needle. No immediate complications. Medications utilized - Versed 1 mg IV,  Fentanyl 50 mcg IV.

## 2011-05-06 NOTE — H&P (Signed)
Agree 

## 2011-05-06 NOTE — Progress Notes (Signed)
Patient assisted up to bathroom and tolerated well. Bandaid to abdomen CDI. Verbalized understanding of d/c instructions. Home with husband.

## 2011-05-20 ENCOUNTER — Other Ambulatory Visit: Payer: Self-pay | Admitting: Internal Medicine

## 2011-05-23 DIAGNOSIS — L408 Other psoriasis: Secondary | ICD-10-CM | POA: Diagnosis not present

## 2011-05-23 DIAGNOSIS — Z79899 Other long term (current) drug therapy: Secondary | ICD-10-CM | POA: Diagnosis not present

## 2011-05-27 ENCOUNTER — Other Ambulatory Visit: Payer: Self-pay | Admitting: Internal Medicine

## 2011-06-10 DIAGNOSIS — Z79899 Other long term (current) drug therapy: Secondary | ICD-10-CM | POA: Diagnosis not present

## 2011-06-10 DIAGNOSIS — L408 Other psoriasis: Secondary | ICD-10-CM | POA: Diagnosis not present

## 2011-07-12 DIAGNOSIS — L408 Other psoriasis: Secondary | ICD-10-CM | POA: Diagnosis not present

## 2011-07-22 ENCOUNTER — Other Ambulatory Visit: Payer: Self-pay | Admitting: Internal Medicine

## 2011-08-09 DIAGNOSIS — N3946 Mixed incontinence: Secondary | ICD-10-CM | POA: Diagnosis not present

## 2011-08-09 DIAGNOSIS — N3020 Other chronic cystitis without hematuria: Secondary | ICD-10-CM | POA: Diagnosis not present

## 2011-08-13 DIAGNOSIS — Z79899 Other long term (current) drug therapy: Secondary | ICD-10-CM | POA: Diagnosis not present

## 2011-08-13 DIAGNOSIS — L408 Other psoriasis: Secondary | ICD-10-CM | POA: Diagnosis not present

## 2011-08-27 DIAGNOSIS — Z1231 Encounter for screening mammogram for malignant neoplasm of breast: Secondary | ICD-10-CM | POA: Diagnosis not present

## 2011-09-03 ENCOUNTER — Encounter: Payer: Self-pay | Admitting: Internal Medicine

## 2011-09-09 DIAGNOSIS — J387 Other diseases of larynx: Secondary | ICD-10-CM | POA: Diagnosis not present

## 2011-09-09 DIAGNOSIS — R490 Dysphonia: Secondary | ICD-10-CM | POA: Diagnosis not present

## 2011-09-09 DIAGNOSIS — J3802 Paralysis of vocal cords and larynx, bilateral: Secondary | ICD-10-CM | POA: Diagnosis not present

## 2011-09-10 DIAGNOSIS — Z79899 Other long term (current) drug therapy: Secondary | ICD-10-CM | POA: Diagnosis not present

## 2011-09-10 DIAGNOSIS — L408 Other psoriasis: Secondary | ICD-10-CM | POA: Diagnosis not present

## 2011-09-24 DIAGNOSIS — L408 Other psoriasis: Secondary | ICD-10-CM | POA: Diagnosis not present

## 2011-09-24 DIAGNOSIS — Z79899 Other long term (current) drug therapy: Secondary | ICD-10-CM | POA: Diagnosis not present

## 2011-11-09 ENCOUNTER — Other Ambulatory Visit: Payer: Self-pay | Admitting: Internal Medicine

## 2011-11-12 ENCOUNTER — Encounter: Payer: Self-pay | Admitting: Internal Medicine

## 2011-11-12 ENCOUNTER — Ambulatory Visit (INDEPENDENT_AMBULATORY_CARE_PROVIDER_SITE_OTHER): Payer: Medicare Other | Admitting: Internal Medicine

## 2011-11-12 VITALS — BP 152/82 | HR 76 | Temp 98.1°F | Ht 66.0 in | Wt 151.0 lb

## 2011-11-12 DIAGNOSIS — E785 Hyperlipidemia, unspecified: Secondary | ICD-10-CM | POA: Insufficient documentation

## 2011-11-12 DIAGNOSIS — L259 Unspecified contact dermatitis, unspecified cause: Secondary | ICD-10-CM | POA: Diagnosis not present

## 2011-11-12 DIAGNOSIS — K219 Gastro-esophageal reflux disease without esophagitis: Secondary | ICD-10-CM

## 2011-11-12 DIAGNOSIS — I10 Essential (primary) hypertension: Secondary | ICD-10-CM

## 2011-11-12 DIAGNOSIS — L309 Dermatitis, unspecified: Secondary | ICD-10-CM

## 2011-11-12 LAB — BASIC METABOLIC PANEL
BUN: 14 mg/dL (ref 6–23)
Calcium: 9.2 mg/dL (ref 8.4–10.5)
Creatinine, Ser: 0.7 mg/dL (ref 0.4–1.2)
GFR: 84.85 mL/min (ref >60.00)
Glucose, Bld: 93 mg/dL (ref 70–99)
Sodium: 137 mEq/L (ref 135–145)

## 2011-11-12 LAB — HEPATIC FUNCTION PANEL
ALT: 31 U/L (ref 0–35)
Alkaline Phosphatase: 53 U/L (ref 39–117)
Bilirubin, Direct: 0.1 mg/dL (ref 0.0–0.3)
Total Protein: 7 g/dL (ref 6.0–8.3)

## 2011-11-12 LAB — LDL CHOLESTEROL, DIRECT: Direct LDL: 189.2 mg/dL

## 2011-11-12 LAB — CBC WITH DIFFERENTIAL/PLATELET
Basophils Relative: 0.6 % (ref 0.0–3.0)
Eosinophils Relative: 1.4 % (ref 0.0–5.0)
HCT: 40.4 % (ref 36.0–46.0)
Lymphs Abs: 1.3 10*3/uL (ref 0.7–4.0)
MCV: 96.1 fl (ref 78.0–100.0)
Monocytes Absolute: 0.4 10*3/uL (ref 0.1–1.0)
Platelets: 216 10*3/uL (ref 150.0–400.0)
WBC: 4 10*3/uL — ABNORMAL LOW (ref 4.5–10.5)

## 2011-11-12 MED ORDER — SOLIFENACIN SUCCINATE 5 MG PO TABS: 5.0000 mg | ORAL_TABLET | Freq: Every day | ORAL | Status: DC

## 2011-11-12 NOTE — Assessment & Plan Note (Signed)
Check labs today.

## 2011-11-12 NOTE — Progress Notes (Signed)
Patient ID: Shannon Mcintyre, female   DOB: [birth year], 78 y.o.   MRN: [id number] Seborrheic dermatitis-- seeing dr Londell Moh Urinary incontinence and recurrent UTI-- well controlled on trimethoprim and vesicare.  htn- tolerating meds  Past Medical History  Diagnosis Date  . Diverticulosis of colon   . Eczema   . Arthritis   . Hypertension     History   Social History  . Marital Status: Married    Spouse Name: N/A    Number of Children: N/A  . Years of Education: N/A   Occupational History  . Not on file.   Social History Main Topics  . Smoking status: Never Smoker   . Smokeless tobacco: Not on file  . Alcohol Use: Yes  . Drug Use:   . Sexually Active:    Other Topics Concern  . Not on file   Social History Narrative  . No narrative on file    Past Surgical History  Procedure Date  . Appendectomy 1980  . Abdominal hysterectomy 1980    BSO  . Knee arthroscopy 2001    left  . Knee arthoscopy 2010    right    Family History  Problem Relation Age of Onset  . Heart attack Mother   . Heart attack Father     No Known Allergies  Current Outpatient Prescriptions on File Prior to Visit  Medication Sig Dispense Refill  . aspirin 81 MG tablet Take 81 mg by mouth daily.        Marland Kitchen lisinopril (PRINIVIL,ZESTRIL) 10 MG tablet TAKE 1 TABLET BY MOUTH ONCE A DAY  90 tablet  3  . methotrexate (RHEUMATREX) 2.5 MG tablet Take 5 mg by mouth 3 (three) times a week. Caution:Chemotherapy. Protect from light.       Marland Kitchen omeprazole (PRILOSEC) 40 MG capsule Take 40 mg by mouth daily.      Marland Kitchen PREMARIN 0.3 MG tablet TAKE 1 TABLET BY MOUTH ONCE A DAY  30 tablet  5  . sodium chloride (OCEAN) 0.65 % nasal spray Place 1 spray into the nose as needed. For dryness.      . trimethoprim (TRIMPEX) 100 MG tablet Take 100 mg by mouth at bedtime.      . VESICARE 5 MG tablet TAKE 1 TABLET BY MOUTH ONCE A DAY  30 tablet  5     patient denies chest pain, shortness of breath, orthopnea. Denies lower  extremity edema, abdominal pain, change in appetite, change in bowel movements. Patient denies rashes, musculoskeletal complaints. No other specific complaints in a complete review of systems.   BP 152/82  Pulse 76  Temp 98.1 F (36.7 C) (Oral)  Ht 5\' 6"  (1.676 m)  Wt 151 lb (68.493 kg)  BMI 24.37 kg/m2  Well-developed well-nourished female in no acute distress. HEENT exam atraumatic, normocephalic, extraocular muscles are intact. Neck is supple. No jugular venous distention no thyromegaly. Chest clear to auscultation without increased work of breathing. Cardiac exam S1 and S2 are regular. Abdominal exam active bowel sounds, soft, nontender. Extremities no edema. Neurologic exam she is alert without any motor sensory deficits. Gait is normal.

## 2011-11-12 NOTE — Assessment & Plan Note (Signed)
BP Readings from Last 3 Encounters:  11/12/11 152/82  05/06/11 114/60  11/05/10 132/74   She will monitor at home Repeat bp here 144/74

## 2011-11-12 NOTE — Assessment & Plan Note (Signed)
Has regular f/u with dermatology.

## 2011-11-19 DIAGNOSIS — L408 Other psoriasis: Secondary | ICD-10-CM | POA: Diagnosis not present

## 2011-11-28 ENCOUNTER — Other Ambulatory Visit: Payer: Self-pay | Admitting: Internal Medicine

## 2011-12-30 DIAGNOSIS — Z79899 Other long term (current) drug therapy: Secondary | ICD-10-CM | POA: Diagnosis not present

## 2011-12-30 DIAGNOSIS — L819 Disorder of pigmentation, unspecified: Secondary | ICD-10-CM | POA: Diagnosis not present

## 2011-12-30 DIAGNOSIS — L408 Other psoriasis: Secondary | ICD-10-CM | POA: Diagnosis not present

## 2012-01-03 DIAGNOSIS — Z23 Encounter for immunization: Secondary | ICD-10-CM | POA: Diagnosis not present

## 2012-01-15 DIAGNOSIS — H908 Mixed conductive and sensorineural hearing loss, unspecified: Secondary | ICD-10-CM | POA: Diagnosis not present

## 2012-04-07 DIAGNOSIS — L408 Other psoriasis: Secondary | ICD-10-CM | POA: Diagnosis not present

## 2012-04-07 DIAGNOSIS — L219 Seborrheic dermatitis, unspecified: Secondary | ICD-10-CM | POA: Diagnosis not present

## 2012-04-07 DIAGNOSIS — Z79899 Other long term (current) drug therapy: Secondary | ICD-10-CM | POA: Diagnosis not present

## 2012-05-22 DIAGNOSIS — N3946 Mixed incontinence: Secondary | ICD-10-CM | POA: Diagnosis not present

## 2012-05-25 ENCOUNTER — Other Ambulatory Visit: Payer: Self-pay | Admitting: Internal Medicine

## 2012-07-13 ENCOUNTER — Other Ambulatory Visit: Payer: Self-pay | Admitting: Internal Medicine

## 2012-07-14 DIAGNOSIS — Z79899 Other long term (current) drug therapy: Secondary | ICD-10-CM | POA: Diagnosis not present

## 2012-07-14 DIAGNOSIS — L408 Other psoriasis: Secondary | ICD-10-CM | POA: Diagnosis not present

## 2012-08-27 DIAGNOSIS — Z1231 Encounter for screening mammogram for malignant neoplasm of breast: Secondary | ICD-10-CM | POA: Diagnosis not present

## 2012-08-27 DIAGNOSIS — M949 Disorder of cartilage, unspecified: Secondary | ICD-10-CM | POA: Diagnosis not present

## 2012-10-01 ENCOUNTER — Encounter: Payer: Self-pay | Admitting: Internal Medicine

## 2012-10-14 DIAGNOSIS — H902 Conductive hearing loss, unspecified: Secondary | ICD-10-CM | POA: Diagnosis not present

## 2012-10-14 DIAGNOSIS — H6120 Impacted cerumen, unspecified ear: Secondary | ICD-10-CM | POA: Diagnosis not present

## 2012-10-14 DIAGNOSIS — J310 Chronic rhinitis: Secondary | ICD-10-CM | POA: Diagnosis not present

## 2012-11-09 ENCOUNTER — Other Ambulatory Visit: Payer: Self-pay | Admitting: Internal Medicine

## 2012-11-16 DIAGNOSIS — L819 Disorder of pigmentation, unspecified: Secondary | ICD-10-CM | POA: Diagnosis not present

## 2012-11-16 DIAGNOSIS — L723 Sebaceous cyst: Secondary | ICD-10-CM | POA: Diagnosis not present

## 2012-11-16 DIAGNOSIS — L408 Other psoriasis: Secondary | ICD-10-CM | POA: Diagnosis not present

## 2012-11-16 DIAGNOSIS — Z79899 Other long term (current) drug therapy: Secondary | ICD-10-CM | POA: Diagnosis not present

## 2012-11-23 DIAGNOSIS — R490 Dysphonia: Secondary | ICD-10-CM | POA: Diagnosis not present

## 2012-11-25 ENCOUNTER — Other Ambulatory Visit: Payer: Self-pay | Admitting: Internal Medicine

## 2012-12-22 ENCOUNTER — Encounter: Payer: Self-pay | Admitting: Internal Medicine

## 2012-12-22 ENCOUNTER — Ambulatory Visit (INDEPENDENT_AMBULATORY_CARE_PROVIDER_SITE_OTHER): Payer: Medicare Other | Admitting: Internal Medicine

## 2012-12-22 VITALS — BP 144/76 | HR 80 | Temp 98.3°F | Ht 65.5 in | Wt 152.0 lb

## 2012-12-22 DIAGNOSIS — E785 Hyperlipidemia, unspecified: Secondary | ICD-10-CM | POA: Diagnosis not present

## 2012-12-22 DIAGNOSIS — I10 Essential (primary) hypertension: Secondary | ICD-10-CM | POA: Diagnosis not present

## 2012-12-22 DIAGNOSIS — Z23 Encounter for immunization: Secondary | ICD-10-CM | POA: Diagnosis not present

## 2012-12-22 DIAGNOSIS — L259 Unspecified contact dermatitis, unspecified cause: Secondary | ICD-10-CM

## 2012-12-22 DIAGNOSIS — L309 Dermatitis, unspecified: Secondary | ICD-10-CM

## 2012-12-22 DIAGNOSIS — M129 Arthropathy, unspecified: Secondary | ICD-10-CM

## 2012-12-22 DIAGNOSIS — R350 Frequency of micturition: Secondary | ICD-10-CM

## 2012-12-22 LAB — LIPID PANEL: HDL: 66.6 mg/dL (ref >39.00)

## 2012-12-22 LAB — BASIC METABOLIC PANEL
CO2: 27 mEq/L (ref 19–32)
Calcium: 9.4 mg/dL (ref 8.4–10.5)
GFR: 69.68 mL/min (ref >60.00)
Glucose, Bld: 83 mg/dL (ref 70–99)
Potassium: 4.3 mEq/L (ref 3.5–5.1)
Sodium: 139 mEq/L (ref 135–145)

## 2012-12-22 LAB — CBC WITH DIFFERENTIAL/PLATELET
Basophils Absolute: 0 10*3/uL (ref 0.0–0.1)
Basophils Relative: 0.6 % (ref 0.0–3.0)
Eosinophils Relative: 1.9 % (ref 0.0–5.0)
HCT: 38.3 % (ref 36.0–46.0)
Lymphocytes Relative: 30.2 % (ref 12.0–46.0)
Lymphs Abs: 1.7 10*3/uL (ref 0.7–4.0)
MCV: 95.1 fl (ref 78.0–100.0)
Monocytes Absolute: 0.5 10*3/uL (ref 0.1–1.0)
RBC: 4.02 Mil/uL (ref 3.87–5.11)
WBC: 5.6 10*3/uL (ref 4.5–10.5)

## 2012-12-22 LAB — POCT URINALYSIS DIPSTICK
Glucose, UA: NEGATIVE
Ketones, UA: NEGATIVE
Nitrite, UA: NEGATIVE
Protein, UA: NEGATIVE
Spec Grav, UA: 1.02

## 2012-12-22 LAB — HEPATIC FUNCTION PANEL
AST: 25 U/L (ref 0–37)
Alkaline Phosphatase: 50 U/L (ref 39–117)
Bilirubin, Direct: 0.1 mg/dL (ref 0.0–0.3)

## 2012-12-22 MED ORDER — FLUTICASONE PROPIONATE 50 MCG/ACT NA SUSP: 2.0000 | Freq: Every day | NASAL | Status: DC

## 2012-12-22 NOTE — Progress Notes (Signed)
F/u She feels well Chronic skin condition : "eczema"- on MTX and followed by dermatology  Lipids- on no meds because of hisorically elevated hdl  htn- tolerating meds  Recurrent uti- resolved on ABX- now off prophylactic ABX GERD- tolerating PPI  Reviewed pmh, psh, sochx, meds Ros:  patient denies chest pain, shortness of breath, orthopnea. Denies lower extremity edema, abdominal pain, change in appetite, change in bowel movements. Patient denies rashes, musculoskeletal complaints. No other specific complaints in a complete review of systems.   Reviewed vitals   Well-developed well-nourished female in no acute distress. HEENT exam atraumatic, normocephalic, extraocular muscles are intact. Neck is supple. No jugular venous distention no thyromegaly. Chest clear to auscultation without increased work of breathing. Cardiac exam S1 and S2 are regular. Abdominal exam active bowel sounds, soft, nontender. Extremities no edema. Neurologic exam she is alert without any motor sensory deficits. Gait is normal.   She has a chronically hoarse voice and PNDr- trial flonase

## 2012-12-24 ENCOUNTER — Other Ambulatory Visit: Payer: Self-pay | Admitting: Internal Medicine

## 2012-12-25 NOTE — Assessment & Plan Note (Signed)
She has marked changes of OA affecting hands No treatment

## 2012-12-25 NOTE — Assessment & Plan Note (Signed)
Controlled Continue same meds 

## 2012-12-25 NOTE — Assessment & Plan Note (Signed)
She has regular f/u with derm Check labs today

## 2012-12-25 NOTE — Assessment & Plan Note (Signed)
Check today Previously elevated hdl

## 2013-02-03 ENCOUNTER — Other Ambulatory Visit: Payer: Self-pay | Admitting: Internal Medicine

## 2013-03-17 DIAGNOSIS — L408 Other psoriasis: Secondary | ICD-10-CM | POA: Diagnosis not present

## 2013-03-17 DIAGNOSIS — L819 Disorder of pigmentation, unspecified: Secondary | ICD-10-CM | POA: Diagnosis not present

## 2013-03-17 DIAGNOSIS — Z79899 Other long term (current) drug therapy: Secondary | ICD-10-CM | POA: Diagnosis not present

## 2013-05-06 ENCOUNTER — Other Ambulatory Visit: Payer: Self-pay | Admitting: Internal Medicine

## 2013-06-22 ENCOUNTER — Ambulatory Visit (INDEPENDENT_AMBULATORY_CARE_PROVIDER_SITE_OTHER): Payer: Medicare Other | Admitting: Family

## 2013-06-22 ENCOUNTER — Encounter: Payer: Self-pay | Admitting: Family

## 2013-06-22 VITALS — BP 140/76 | HR 95 | Temp 97.3°F | Ht 65.5 in | Wt 156.0 lb

## 2013-06-22 DIAGNOSIS — H6690 Otitis media, unspecified, unspecified ear: Secondary | ICD-10-CM | POA: Diagnosis not present

## 2013-06-22 DIAGNOSIS — J309 Allergic rhinitis, unspecified: Secondary | ICD-10-CM

## 2013-06-22 MED ORDER — CEFDINIR 300 MG PO CAPS: 300.0000 mg | ORAL_CAPSULE | Freq: Two times a day (BID) | ORAL | Status: DC

## 2013-06-22 NOTE — Progress Notes (Signed)
Subjective:    Patient ID: Shannon Mcintyre, female    DOB: 03-16-1933, 80 y.o.   MRN: [id number]  HPI 80 year old white female, nonsmoker, patient of Dr. Cato Mulligan in today with complaints of cough, congestion, runny nose x2 weeks then initially was beginning to resolve but returned and is now worse. Reports a history of chronic right ear inflammation and infection the time. Now has decreased hearing in her right ear.   Review of Systems  Constitutional: Negative.   HENT: Positive for congestion and ear discharge.   Cardiovascular: Negative.   Gastrointestinal: Negative.   Musculoskeletal: Negative.   Skin: Negative.   Allergic/Immunologic: Negative.   Neurological: Negative.   Psychiatric/Behavioral: Negative.    Past Medical History  Diagnosis Date  . Diverticulosis of colon   . Eczema   . Arthritis   . Hypertension     History   Social History  . Marital Status: Married    Spouse Name: N/A    Number of Children: N/A  . Years of Education: N/A   Occupational History  . Not on file.   Social History Main Topics  . Smoking status: Never Smoker   . Smokeless tobacco: Not on file  . Alcohol Use: Yes  . Drug Use:   . Sexual Activity:    Other Topics Concern  . Not on file   Social History Narrative  . No narrative on file    Past Surgical History  Procedure Laterality Date  . Appendectomy  1980  . Abdominal hysterectomy  1980    BSO  . Knee arthroscopy  2001    left  . Knee arthoscopy  2010    right    Family History  Problem Relation Age of Onset  . Heart attack Mother   . Heart attack Father     No Known Allergies  Current Outpatient Prescriptions on File Prior to Visit  Medication Sig Dispense Refill  . aspirin 81 MG tablet Take 81 mg by mouth daily.        Marland Kitchen lisinopril (PRINIVIL,ZESTRIL) 10 MG tablet TAKE 1 TABLET BY MOUTH ONCE A DAY  90 tablet  3  . methotrexate (RHEUMATREX) 2.5 MG tablet Take 5 mg by mouth 3 (three) times a week.  Caution:Chemotherapy. Protect from light.       Marland Kitchen PREMARIN 0.3 MG tablet TAKE 1 TABLET BY MOUTH ONCE A DAY  30 tablet  5  . sodium chloride (OCEAN) 0.65 % nasal spray Place 1 spray into the nose as needed. For dryness.      . VESICARE 5 MG tablet TAKE 1 TABLET BY MOUTH DAILY.  90 tablet  1  . fluticasone (FLONASE) 50 MCG/ACT nasal spray Place 2 sprays into the nose daily.  16 g  6  . hydrOXYzine (ATARAX/VISTARIL) 25 MG tablet Take 1 tablet by mouth 3 (three) times daily.      Arby Barrette 25 MG capsule Take 1 tablet by mouth daily.      . [DISCONTINUED] solifenacin (VESICARE) 5 MG tablet Take 1 tablet (5 mg total) by mouth daily.  90 tablet  3   No current facility-administered medications on file prior to visit.    BP 140/76  Pulse 95  Temp(Src) 97.3 F (36.3 C) (Oral)  Ht 5' 5.5" (1.664 m)  Wt 156 lb (70.761 kg)  BMI 25.56 kg/m2  SpO2 97%chart    Objective:   Physical Exam  Constitutional: She is oriented to person, place, and time. She appears well-developed  and well-nourished.  HENT:  Left Ear: External ear normal.  Nose: Nose normal.  Mouth/Throat: Oropharynx is clear and moist.  Significant scar tissues of the right ear. Tympanic membrane red. Diminished light reflex  Neck: Normal range of motion. Neck supple.  Cardiovascular: Normal rate, regular rhythm and normal heart sounds.   Pulmonary/Chest: Effort normal and breath sounds normal.  Musculoskeletal: Normal range of motion.  Neurological: She is alert and oriented to person, place, and time.  Skin: Skin is warm and dry.  Psychiatric: She has a normal mood and affect.          Assessment & Plan:  Deneane was seen today for cough.  Diagnoses and associated orders for this visit:  Otitis media  Allergic rhinitis  Other Orders - cefdinir (OMNICEF) 300 MG capsule; Take 1 capsule (300 mg total) by mouth 2 (two) times daily.   Call the office with any questions or concerns. Recheck as scheduled, and as needed

## 2013-06-22 NOTE — Patient Instructions (Signed)
Zyrtec 10mg  once daily.   Otitis Media, Adult Otitis media is redness, soreness, and swelling (inflammation) of the middle ear. Otitis media may be caused by allergies or, most commonly, by infection. Often it occurs as a complication of the common cold. SIGNS AND SYMPTOMS Symptoms of otitis media may include:  Earache.  Fever.  Ringing in your ear.  Headache.  Leakage of fluid from the ear. DIAGNOSIS To diagnose otitis media, your health care provider will examine your ear with an otoscope. This is an instrument that allows your health care provider to see into your ear in order to examine your eardrum. Your health care provider also will ask you questions about your symptoms. TREATMENT  Typically, otitis media resolves on its own within 3 5 days. Your health care provider may prescribe medicine to ease your symptoms of pain. If otitis media does not resolve within 5 days or is recurrent, your health care provider may prescribe antibiotic medicines if he or she suspects that a bacterial infection is the cause. HOME CARE INSTRUCTIONS   Take your medicine as directed until it is gone, even if you feel better after the first few days.  Only take over-the-counter or prescription medicines for pain, discomfort, or fever as directed by your health care provider.  Follow up with your health care provider as directed. SEEK MEDICAL CARE IF:  You have otitis media only in one ear or bleeding from your nose or both.  You notice a lump on your neck.  You are not getting better in 3 5 days.  You feel worse instead of better. SEEK IMMEDIATE MEDICAL CARE IF:   You have pain that is not controlled with medicine.  You have swelling, redness, or pain around your ear or stiffness in your neck.  You notice that part of your face is paralyzed.  You notice that the bone behind your ear (mastoid) is tender when you touch it. MAKE SURE YOU:   Understand these instructions.  Will watch your  condition.  Will get help right away if you are not doing well or get worse. Document Released: 12/22/2003 Document Revised: 01/06/2013 Document Reviewed: 10/13/2012 [facility] Patient Information 2014 [place], [place].   Allergic Rhinitis Allergic rhinitis is when the mucous membranes in the nose respond to allergens. Allergens are particles in the air that cause your body to have an allergic reaction. This causes you to release allergic antibodies. Through a chain of events, these eventually cause you to release histamine into the blood stream. Although meant to protect the body, it is this release of histamine that causes your discomfort, such as frequent sneezing, congestion, and an itchy, runny nose.  CAUSES  Seasonal allergic rhinitis (hay fever) is caused by pollen allergens that may come from grasses, trees, and weeds. Year-round allergic rhinitis (perennial allergic rhinitis) is caused by allergens such as house dust mites, pet dander, and mold spores.  SYMPTOMS   Nasal stuffiness (congestion).  Itchy, runny nose with sneezing and tearing of the eyes. DIAGNOSIS  Your health care provider can help you determine the allergen or allergens that trigger your symptoms. If you and your health care provider are unable to determine the allergen, skin or blood testing may be used. TREATMENT  Allergic Rhinitis does not have a cure, but it can be controlled by:  Medicines and allergy shots (immunotherapy).  Avoiding the allergen. Hay fever may often be treated with antihistamines in pill or nasal spray forms. Antihistamines block the effects of histamine. There  are over-the-counter medicines that may help with nasal congestion and swelling around the eyes. Check with your health care provider before taking or giving this medicine.  If avoiding the allergen or the medicine prescribed do not work, there are many new medicines your health care provider can prescribe. Stronger medicine may be used if  initial measures are ineffective. Desensitizing injections can be used if medicine and avoidance does not work. Desensitization is when a patient is given ongoing shots until the body becomes less sensitive to the allergen. Make sure you follow up with your health care provider if problems continue. HOME CARE INSTRUCTIONS It is not possible to completely avoid allergens, but you can reduce your symptoms by taking steps to limit your exposure to them. It helps to know exactly what you are allergic to so that you can avoid your specific triggers. SEEK MEDICAL CARE IF:   You have a fever.  You develop a cough that does not stop easily (persistent).  You have shortness of breath.  You start wheezing.  Symptoms interfere with normal daily activities. Document Released: 12/11/2000 Document Revised: 01/06/2013 Document Reviewed: 11/23/2012 [facility] Patient Information 2014 [place], [place].

## 2013-06-22 NOTE — Progress Notes (Signed)
Pre visit review using our clinic review tool, if applicable. No additional management support is needed unless otherwise documented below in the visit note. 

## 2013-07-07 DIAGNOSIS — H906 Mixed conductive and sensorineural hearing loss, bilateral: Secondary | ICD-10-CM | POA: Diagnosis not present

## 2013-07-12 ENCOUNTER — Other Ambulatory Visit: Payer: Self-pay | Admitting: Internal Medicine

## 2013-07-13 DIAGNOSIS — Z79899 Other long term (current) drug therapy: Secondary | ICD-10-CM | POA: Diagnosis not present

## 2013-07-13 DIAGNOSIS — L408 Other psoriasis: Secondary | ICD-10-CM | POA: Diagnosis not present

## 2013-07-13 DIAGNOSIS — L821 Other seborrheic keratosis: Secondary | ICD-10-CM | POA: Diagnosis not present

## 2013-07-14 ENCOUNTER — Other Ambulatory Visit (HOSPITAL_COMMUNITY): Payer: Self-pay | Admitting: Dermatology

## 2013-07-14 DIAGNOSIS — L409 Psoriasis, unspecified: Secondary | ICD-10-CM

## 2013-07-15 ENCOUNTER — Other Ambulatory Visit (HOSPITAL_COMMUNITY): Payer: Self-pay | Admitting: Dermatology

## 2013-07-15 DIAGNOSIS — L409 Psoriasis, unspecified: Secondary | ICD-10-CM

## 2013-07-19 ENCOUNTER — Other Ambulatory Visit: Payer: Self-pay | Admitting: Internal Medicine

## 2013-08-11 ENCOUNTER — Other Ambulatory Visit: Payer: Self-pay | Admitting: Radiology

## 2013-08-12 ENCOUNTER — Other Ambulatory Visit: Payer: Self-pay | Admitting: Radiology

## 2013-08-13 ENCOUNTER — Encounter (HOSPITAL_COMMUNITY): Payer: Self-pay | Admitting: Pharmacy Technician

## 2013-08-16 ENCOUNTER — Ambulatory Visit (HOSPITAL_COMMUNITY)
Admission: RE | Admit: 2013-08-16 | Discharge: 2013-08-16 | Disposition: A | Discharge status: Home / Self Care | Payer: Medicare Other | Source: Ambulatory Visit | Attending: Dermatology | Admitting: Dermatology

## 2013-08-16 ENCOUNTER — Encounter (HOSPITAL_COMMUNITY): Payer: Self-pay

## 2013-08-16 DIAGNOSIS — L408 Other psoriasis: Secondary | ICD-10-CM | POA: Diagnosis not present

## 2013-08-16 DIAGNOSIS — Z7982 Long term (current) use of aspirin: Secondary | ICD-10-CM | POA: Diagnosis not present

## 2013-08-16 DIAGNOSIS — L259 Unspecified contact dermatitis, unspecified cause: Secondary | ICD-10-CM | POA: Diagnosis not present

## 2013-08-16 DIAGNOSIS — K7689 Other specified diseases of liver: Secondary | ICD-10-CM | POA: Diagnosis not present

## 2013-08-16 DIAGNOSIS — I10 Essential (primary) hypertension: Secondary | ICD-10-CM | POA: Insufficient documentation

## 2013-08-16 DIAGNOSIS — L409 Psoriasis, unspecified: Secondary | ICD-10-CM

## 2013-08-16 DIAGNOSIS — Z79899 Other long term (current) drug therapy: Secondary | ICD-10-CM | POA: Diagnosis not present

## 2013-08-16 LAB — CBC
HEMATOCRIT: 36.1 % (ref 36.0–46.0)
HEMOGLOBIN: 12.8 g/dL (ref 12.0–15.0)
MCH: 31.8 pg (ref 26.0–34.0)
MCHC: 35.5 g/dL (ref 30.0–36.0)
MCV: 89.8 fL (ref 78.0–100.0)
Platelets: 209 10*3/uL (ref 150–400)
RBC: 4.02 MIL/uL (ref 3.87–5.11)
RDW: 13 % (ref 11.5–15.5)
WBC: 6.5 10*3/uL (ref 4.0–10.5)

## 2013-08-16 LAB — PROTIME-INR
INR: 1.02 (ref 0.00–1.49)
Prothrombin Time: 13.2 seconds (ref 11.6–15.2)

## 2013-08-16 LAB — APTT: aPTT: 32 seconds (ref 24–37)

## 2013-08-16 MED ORDER — FENTANYL CITRATE 0.05 MG/ML IJ SOLN
INTRAMUSCULAR | Status: AC
  Filled 2013-08-16: qty 4

## 2013-08-16 MED ORDER — SODIUM CHLORIDE 0.9 % IV SOLN
INTRAVENOUS | Status: DC
  Administered 2013-08-16: 09:00:00 via INTRAVENOUS

## 2013-08-16 MED ORDER — MIDAZOLAM HCL 2 MG/2ML IJ SOLN
INTRAMUSCULAR | Status: AC | PRN
  Administered 2013-08-16: 1 mg via INTRAVENOUS

## 2013-08-16 MED ORDER — FENTANYL CITRATE 0.05 MG/ML IJ SOLN
INTRAMUSCULAR | Status: AC | PRN
Start: 2013-08-16 — End: 2013-08-16
  Administered 2013-08-16: 100 ug via INTRAVENOUS

## 2013-08-16 MED ORDER — MIDAZOLAM HCL 2 MG/2ML IJ SOLN
INTRAMUSCULAR | Status: AC
  Filled 2013-08-16: qty 4

## 2013-08-16 NOTE — H&P (Signed)
Shannon Mcintyre is an 80 y.o. female.   Chief Complaint: "I'm having a liver biopsy' HPI: Patient with history of eczema/psoriasis and currently on methotrexate therapy presents today for US guided random liver biopsy.                                                                                                                                                                                                           Past Medical History  Diagnosis Date  . Diverticulosis of colon   . Eczema   . Arthritis   . Hypertension     Past Surgical History  Procedure Laterality Date  . Appendectomy  1980  . Abdominal hysterectomy  1980    BSO  . Knee arthroscopy  2001    left  . Knee arthoscopy  2010    right    Family History  Problem Relation Age of Onset  . Heart attack Mother   . Heart attack Father    Social History:  reports that she has never smoked. She does not have any smokeless tobacco history on file. She reports that she drinks alcohol. Her drug history is not on file.  Allergies: No Known Allergies  Current outpatient prescriptions:aspirin 81 MG tablet, Take 81 mg by mouth every evening. , Disp: , Rfl: ;  estrogens, conjugated, (PREMARIN) 0.3 MG tablet, Take 0.3 mg by mouth daily. Take daily for 21 days then do not take for 7 days., Disp: , Rfl: ;  hydrOXYzine (ATARAX/VISTARIL) 25 MG tablet, Take 25 mg by mouth daily. , Disp: , Rfl: ;  lisinopril (PRINIVIL,ZESTRIL) 10 MG tablet, Take 10 mg by mouth every evening., Disp: , Rfl:  methotrexate (RHEUMATREX) 2.5 MG tablet, Take 5-7.5 mg by mouth 2 (two) times a week. Caution:Chemotherapy. Protect from light. Tuesday 7.5 mg in the am and 5mg  in the pm and [place] 7.5 mg in the am, Disp: , Rfl: ;  solifenacin (VESICARE) 5 MG tablet, Take 5 mg by mouth daily., Disp: , Rfl: ;  [DISCONTINUED] solifenacin (VESICARE) 5 MG tablet, Take 1 tablet (5 mg total) by mouth daily., Disp: 90 tablet, Rfl: 3 Current facility-administered  medications:0.9 %  sodium chloride infusion, , Intravenous, Continuous, D Kevin Dameisha Tschida, PA-C, Last Rate: 50 mL/hr at 08/16/13 0850;  fentaNYL (SUBLIMAZE) 0.05 MG/ML injection, , , , ;  midazolam (VERSED) 2 MG/2ML injection, , , ,    Results for orders placed during the hospital encounter of 08/16/13 (from the past 48 hour(s))  APTT     Status: None   Collection  Time    08/16/13  8:45 AM      Result Value Ref Range   aPTT 32  24 - 37 seconds  CBC     Status: None   Collection Time    08/16/13  8:45 AM      Result Value Ref Range   WBC 6.5  4.0 - 10.5 K/uL   RBC 4.02  3.87 - 5.11 MIL/uL   Hemoglobin 12.8  12.0 - 15.0 g/dL   HCT 09.9  83.3 - 82.5 %   MCV 89.8  78.0 - 100.0 fL   MCH 31.8  26.0 - 34.0 pg   MCHC 35.5  30.0 - 36.0 g/dL   RDW 05.3  97.6 - 73.4 %   Platelets 209  150 - 400 K/uL  PROTIME-INR     Status: None   Collection Time    08/16/13  8:45 AM      Result Value Ref Range   Prothrombin Time 13.2  11.6 - 15.2 seconds   INR 1.02  0.00 - 1.49   No results found.  Review of Systems  Constitutional: Negative for fever and chills.  Respiratory: Negative for cough and shortness of breath.   Cardiovascular: Negative for chest pain.  Gastrointestinal: Negative for nausea, vomiting and abdominal pain.  Musculoskeletal: Negative for back pain.  Skin: Positive for rash.       Eczema/psoriasis  Neurological: Negative for headaches.  Endo/Heme/Allergies: Does not bruise/bleed easily.    Blood pressure 137/56, pulse 67, temperature 97.8 F (36.6 C), temperature source Oral, resp. rate 16, SpO2 99.00%. Physical Exam  Constitutional: She is oriented to person, place, and time. She appears well-developed and well-nourished.  Cardiovascular: Normal rate and regular rhythm.   Respiratory: Effort normal and breath sounds normal.  GI: Soft. Bowel sounds are normal.  Musculoskeletal: Normal range of motion. She exhibits no edema.  Neurological: She is oriented to person, place,  and time.     Assessment/Plan  Patient with history of eczema/psoriasis and currently on methotrexate therapy presents today for US guided random liver biopsy.   Details/risks of procedure d/w pt/husband with their understanding and consent.                                                                                                                                                                                                         Shannon Mcintyre 08/16/2013, 9:22 AM

## 2013-08-16 NOTE — Procedures (Signed)
Technically successful US guided random liver biopsy.  No immediate complications.

## 2013-08-16 NOTE — Discharge Instructions (Signed)
Liver Biopsy °Care After °Refer to this sheet in the next few weeks. These discharge instructions provide you with general information on caring for yourself after you leave the hospital. Your caregiver may also give you specific instructions. Your treatment has been planned according to the most current medical practices available, but unavoidable complications sometimes occur. If you have any problems or questions after discharge, please call your caregiver. °HOME CARE INSTRUCTIONS  °· You should rest for 1 to 2 days or as instructed. °· If you go home the same day as your procedure (outpatient), have a responsible adult take you home and stay with you overnight. °· Do not lift more than 5 pounds or play contact sports for 2 weeks. °· Do not drive for 24 hours after this test. °· Do not take medicine containing aspirin or drink alcohol for 1 week after this test. °· Change bandages (dressings) as directed. °· Only take over-the-counter or prescription medicines for pain, discomfort, or fever as directed by your caregiver. °OBTAINING YOUR TEST RESULTS °Not all test results are available during your visit. If your test results are not back during the visit, make an appointment with your caregiver to find out the results. Do not assume everything is normal if you have not heard from your caregiver or the medical facility. It is important for you to follow up on all of your test results. °SEEK MEDICAL CARE IF:  °· You have increased bleeding (more than a small spot) from the biopsy site. °· You have redness, swelling, or increasing pain in the biopsy site. °· You have an oral temperature above 102° F (38.9° C). °SEEK IMMEDIATE MEDICAL CARE IF:  °· You develop swelling or pain in the belly (abdomen). °· You develop a rash. °· You have difficulty breathing, feel short of breath, or feel faint. °· You develop any reaction or side effects to medicines given. °MAKE SURE YOU:  °· Understand these instructions. °· Will watch  your condition. °· Will get help right away if you are not doing well or get worse. °Document Released: 10/05/2004 Document Revised: 06/10/2011 Document Reviewed: 10/29/2007 °ExitCare® Patient Information ©2014 [employer]. ° °

## 2013-08-17 ENCOUNTER — Encounter: Payer: Self-pay | Admitting: Internal Medicine

## 2013-08-30 DIAGNOSIS — Z1231 Encounter for screening mammogram for malignant neoplasm of breast: Secondary | ICD-10-CM | POA: Diagnosis not present

## 2013-10-27 ENCOUNTER — Encounter: Payer: Self-pay | Admitting: Internal Medicine

## 2013-11-10 ENCOUNTER — Other Ambulatory Visit: Payer: Self-pay | Admitting: Internal Medicine

## 2013-11-15 DIAGNOSIS — L819 Disorder of pigmentation, unspecified: Secondary | ICD-10-CM | POA: Diagnosis not present

## 2013-11-15 DIAGNOSIS — Z79899 Other long term (current) drug therapy: Secondary | ICD-10-CM | POA: Diagnosis not present

## 2013-11-15 DIAGNOSIS — L408 Other psoriasis: Secondary | ICD-10-CM | POA: Diagnosis not present

## 2013-11-22 DIAGNOSIS — J383 Other diseases of vocal cords: Secondary | ICD-10-CM | POA: Insufficient documentation

## 2013-11-22 DIAGNOSIS — R490 Dysphonia: Secondary | ICD-10-CM | POA: Diagnosis not present

## 2013-12-03 DIAGNOSIS — N951 Menopausal and female climacteric states: Secondary | ICD-10-CM | POA: Diagnosis not present

## 2013-12-03 DIAGNOSIS — I10 Essential (primary) hypertension: Secondary | ICD-10-CM | POA: Diagnosis not present

## 2013-12-03 DIAGNOSIS — Z1331 Encounter for screening for depression: Secondary | ICD-10-CM | POA: Diagnosis not present

## 2013-12-03 DIAGNOSIS — Z23 Encounter for immunization: Secondary | ICD-10-CM | POA: Diagnosis not present

## 2013-12-08 DIAGNOSIS — R300 Dysuria: Secondary | ICD-10-CM | POA: Diagnosis not present

## 2013-12-23 DIAGNOSIS — H7290 Unspecified perforation of tympanic membrane, unspecified ear: Secondary | ICD-10-CM | POA: Diagnosis not present

## 2013-12-23 DIAGNOSIS — H908 Mixed conductive and sensorineural hearing loss, unspecified: Secondary | ICD-10-CM | POA: Diagnosis not present

## 2013-12-23 DIAGNOSIS — H608X9 Other otitis externa, unspecified ear: Secondary | ICD-10-CM | POA: Diagnosis not present

## 2013-12-27 ENCOUNTER — Encounter: Payer: Medicare Other | Admitting: Internal Medicine

## 2014-01-06 ENCOUNTER — Telehealth: Payer: Self-pay | Admitting: Internal Medicine

## 2014-01-06 NOTE — Telephone Encounter (Signed)
Pt called to cancel appt with dr. Durene Cal, she states she has another PCP.

## 2014-02-21 DIAGNOSIS — R490 Dysphonia: Secondary | ICD-10-CM | POA: Diagnosis not present

## 2014-02-21 DIAGNOSIS — Z5189 Encounter for other specified aftercare: Secondary | ICD-10-CM | POA: Diagnosis not present

## 2014-03-01 DIAGNOSIS — Z5189 Encounter for other specified aftercare: Secondary | ICD-10-CM | POA: Diagnosis not present

## 2014-03-01 DIAGNOSIS — R490 Dysphonia: Secondary | ICD-10-CM | POA: Diagnosis not present

## 2014-03-14 DIAGNOSIS — L814 Other melanin hyperpigmentation: Secondary | ICD-10-CM | POA: Diagnosis not present

## 2014-03-14 DIAGNOSIS — L400 Psoriasis vulgaris: Secondary | ICD-10-CM | POA: Diagnosis not present

## 2014-03-29 ENCOUNTER — Ambulatory Visit: Payer: Medicare Other | Admitting: Family Medicine

## 2014-04-11 DIAGNOSIS — N3020 Other chronic cystitis without hematuria: Secondary | ICD-10-CM | POA: Diagnosis not present

## 2014-05-17 DIAGNOSIS — R490 Dysphonia: Secondary | ICD-10-CM | POA: Diagnosis not present

## 2014-05-17 DIAGNOSIS — Z5189 Encounter for other specified aftercare: Secondary | ICD-10-CM | POA: Diagnosis not present

## 2014-06-03 DIAGNOSIS — I10 Essential (primary) hypertension: Secondary | ICD-10-CM | POA: Diagnosis not present

## 2014-06-03 DIAGNOSIS — Z79899 Other long term (current) drug therapy: Secondary | ICD-10-CM | POA: Diagnosis not present

## 2014-07-13 ENCOUNTER — Other Ambulatory Visit: Payer: Self-pay | Admitting: Dermatology

## 2014-07-13 DIAGNOSIS — L400 Psoriasis vulgaris: Secondary | ICD-10-CM | POA: Diagnosis not present

## 2014-07-13 DIAGNOSIS — L308 Other specified dermatitis: Secondary | ICD-10-CM | POA: Diagnosis not present

## 2014-07-13 DIAGNOSIS — L986 Other infiltrative disorders of the skin and subcutaneous tissue: Secondary | ICD-10-CM | POA: Diagnosis not present

## 2014-07-29 DIAGNOSIS — L821 Other seborrheic keratosis: Secondary | ICD-10-CM | POA: Diagnosis not present

## 2014-08-06 ENCOUNTER — Other Ambulatory Visit: Payer: Self-pay | Admitting: Internal Medicine

## 2014-08-16 DIAGNOSIS — J209 Acute bronchitis, unspecified: Secondary | ICD-10-CM | POA: Diagnosis not present

## 2014-08-22 DIAGNOSIS — Z5189 Encounter for other specified aftercare: Secondary | ICD-10-CM | POA: Diagnosis not present

## 2014-08-22 DIAGNOSIS — R490 Dysphonia: Secondary | ICD-10-CM | POA: Diagnosis not present

## 2014-09-02 DIAGNOSIS — Z1231 Encounter for screening mammogram for malignant neoplasm of breast: Secondary | ICD-10-CM | POA: Diagnosis not present

## 2014-09-13 DIAGNOSIS — L400 Psoriasis vulgaris: Secondary | ICD-10-CM | POA: Diagnosis not present

## 2014-09-13 DIAGNOSIS — Z79899 Other long term (current) drug therapy: Secondary | ICD-10-CM | POA: Diagnosis not present

## 2014-09-25 ENCOUNTER — Encounter (HOSPITAL_COMMUNITY): Payer: Self-pay | Admitting: *Deleted

## 2014-09-25 ENCOUNTER — Emergency Department (HOSPITAL_COMMUNITY)
Admission: EM | Admit: 2014-09-25 | Discharge: 2014-09-25 | Disposition: A | Discharge status: Home / Self Care | Payer: Medicare Other | Attending: Emergency Medicine | Admitting: Emergency Medicine

## 2014-09-25 DIAGNOSIS — Z7982 Long term (current) use of aspirin: Secondary | ICD-10-CM | POA: Diagnosis not present

## 2014-09-25 DIAGNOSIS — M79603 Pain in arm, unspecified: Secondary | ICD-10-CM | POA: Diagnosis not present

## 2014-09-25 DIAGNOSIS — W1839XA Other fall on same level, initial encounter: Secondary | ICD-10-CM | POA: Insufficient documentation

## 2014-09-25 DIAGNOSIS — Z8719 Personal history of other diseases of the digestive system: Secondary | ICD-10-CM | POA: Diagnosis not present

## 2014-09-25 DIAGNOSIS — Z79899 Other long term (current) drug therapy: Secondary | ICD-10-CM | POA: Diagnosis not present

## 2014-09-25 DIAGNOSIS — Y9389 Activity, other specified: Secondary | ICD-10-CM | POA: Diagnosis not present

## 2014-09-25 DIAGNOSIS — I10 Essential (primary) hypertension: Secondary | ICD-10-CM | POA: Insufficient documentation

## 2014-09-25 DIAGNOSIS — T148 Other injury of unspecified body region: Secondary | ICD-10-CM | POA: Diagnosis not present

## 2014-09-25 DIAGNOSIS — S51812A Laceration without foreign body of left forearm, initial encounter: Secondary | ICD-10-CM | POA: Diagnosis present

## 2014-09-25 DIAGNOSIS — M1990 Unspecified osteoarthritis, unspecified site: Secondary | ICD-10-CM | POA: Diagnosis not present

## 2014-09-25 DIAGNOSIS — Y929 Unspecified place or not applicable: Secondary | ICD-10-CM | POA: Diagnosis not present

## 2014-09-25 DIAGNOSIS — Z872 Personal history of diseases of the skin and subcutaneous tissue: Secondary | ICD-10-CM | POA: Insufficient documentation

## 2014-09-25 DIAGNOSIS — Y999 Unspecified external cause status: Secondary | ICD-10-CM | POA: Insufficient documentation

## 2014-09-25 DIAGNOSIS — W19XXXA Unspecified fall, initial encounter: Secondary | ICD-10-CM

## 2014-09-25 DIAGNOSIS — IMO0001 Reserved for inherently not codable concepts without codable children: Secondary | ICD-10-CM

## 2014-09-25 MED ORDER — LIDOCAINE HCL 1 % IJ SOLN
30.0000 mL | Freq: Once | INTRAMUSCULAR | Status: AC
  Administered 2014-09-25: 30 mL via INTRADERMAL
  Filled 2014-09-25: qty 40

## 2014-09-25 NOTE — ED Notes (Signed)
Wound Care complete. Pt is ready for Suture

## 2014-09-25 NOTE — Discharge Instructions (Signed)
As discussed, with your recently repaired as it is important that you monitor your condition carefully, and do not hesitate if you develop new, or concerning changes in your condition, to return here.  Otherwise, please have the stitches removed 1 week.

## 2014-09-25 NOTE — ED Provider Notes (Signed)
CSN: [id number]     Arrival date & time 09/25/14  1100 History   First MD Initiated Contact with Patient 09/25/14 1102     Chief Complaint  Patient presents with  . Fall     (Consider location/radiation/quality/duration/timing/severity/associated sxs/prior Treatment) HPI Patient presents after mechanical fall with pain in her left arm. Patient recalls the entirety of the event. She states that she fell while walking up stairs, striking her left forearm against a stair. She denies head trauma, neck pain, confusion, disorders, visual changes. She has been ambulatory. She denies other focal complaints. Pain is sore, nonradiating, locally on the lateral posterior. No wrist pain, no upper arm pain.  Past Medical History  Diagnosis Date  . Diverticulosis of colon   . Eczema   . Arthritis   . Hypertension    Past Surgical History  Procedure Laterality Date  . Appendectomy  1980  . Abdominal hysterectomy  1980    BSO  . Knee arthroscopy  2001    left  . Knee arthoscopy  2010    right   Family History  Problem Relation Age of Onset  . Heart attack Mother   . Heart attack Father    History  Substance Use Topics  . Smoking status: Never Smoker   . Smokeless tobacco: Not on file  . Alcohol Use: Yes   OB History    No data available     Review of Systems  Constitutional: Negative for fever.  Respiratory: Negative for shortness of breath.   Cardiovascular: Negative for chest pain.  Musculoskeletal:       Negative aside from HPI  Skin:       Negative aside from HPI  Allergic/Immunologic: Negative for immunocompromised state.  Neurological: Negative for weakness.      Allergies  Review of patient's allergies indicates no known allergies.  Home Medications   Prior to Admission medications   Medication Sig Start Date End Date Taking? Authorizing Provider  aspirin 81 MG tablet Take 81 mg by mouth every evening.     Historical Provider, MD  estrogens, conjugated,  (PREMARIN) 0.3 MG tablet Take 0.3 mg by mouth daily. Take daily for 21 days then do not take for 7 days.    Historical Provider, MD  hydrOXYzine (ATARAX/VISTARIL) 25 MG tablet Take 25 mg by mouth daily.  12/17/12   Historical Provider, MD  lisinopril (PRINIVIL,ZESTRIL) 10 MG tablet Take 10 mg by mouth every evening.    Historical Provider, MD  methotrexate (RHEUMATREX) 2.5 MG tablet Take 5-7.5 mg by mouth 2 (two) times a week. Caution:Chemotherapy. Protect from light. Tuesday 7.5 mg in the am and 5mg  in the pm and [place] 7.5 mg in the am    Historical Provider, MD  VESICARE 5 MG tablet TAKE 1 TABLET BY MOUTH DAILY. 11/10/13   01/10/14 Swords, MD   BP 142/43 mmHg  Pulse 88  Temp(Src) 98.1 F (36.7 C) (Oral)  Resp 18  SpO2 95% Physical Exam  Constitutional: She is oriented to person, place, and time. She appears well-developed and well-nourished. No distress. Cervical collar in place.  HENT:  Head: Normocephalic and atraumatic.  Eyes: Conjunctivae and EOM are normal.  Neck: Neck supple. No spinous process tenderness and no muscular tenderness present. No erythema and normal range of motion present.  Cardiovascular: Normal rate and regular rhythm.   Pulmonary/Chest: Effort normal and breath sounds normal. No stridor. No respiratory distress.  Abdominal: She exhibits no distension.  Musculoskeletal: She exhibits no  edema.       Left elbow: Normal.       Left wrist: Normal.  Neurological: She is alert and oriented to person, place, and time. No cranial nerve deficit.  Skin: Skin is warm and dry.     Psychiatric: She has a normal mood and affect.  Nursing note and vitals reviewed.   ED Course  Procedures (including critical care time)   LACERATION REPAIR Performed by: Gerhard Munch Authorized by: Gerhard Munch Consent: Verbal consent obtained. Risks and benefits: risks, benefits and alternatives were discussed Consent given by: patient Patient identity confirmed: provided  demographic data Prepped and Draped in normal sterile fashion Wound explored  Laceration Location: L forearm  Laceration Length: 14cm  No Foreign Bodies seen or palpated  Anesthesia: local infiltration  Local anesthetic: lidocaine 1% no epinephrine  Anesthetic total: 8 ml  Irrigation method: syringe Amount of cleaning: standard  Skin closure: 4-0 sutures  Number of sutures: 9  Technique: close as possible, though there are very rough edges on the wound  Patient tolerance: Patient tolerated the procedure well with no immediate complications.   MDM   Final diagnoses:  Falling, initial encounter  Laceration of forearm, left, initial encounter   Patient presents after mechanical fall with open wound on the left forearm. Patient is distally neurovascularly intact, no tendons visualized on direct exam. Patient is otherwise well, and tolerated suture repair without complication. Patient discharged in stable condition to follow-up with primary care for removal.   Gerhard Munch, MD 09/25/14 1228

## 2014-09-25 NOTE — ED Notes (Signed)
Pt fell while attempting to climb upstairs.  Laceration to left arm.  Currently wrapped by ems

## 2014-09-25 NOTE — ED Notes (Signed)
MD at bedside. Suturing laceration to left forearm

## 2014-09-25 NOTE — ED Notes (Signed)
Bed: WA06 Expected date: 09/25/14 Expected time: 11:00 AM Means of arrival: Ambulance Comments: Fall-skin tear L FA

## 2014-09-26 ENCOUNTER — Other Ambulatory Visit: Payer: Self-pay

## 2014-12-10 DIAGNOSIS — Z23 Encounter for immunization: Secondary | ICD-10-CM | POA: Diagnosis not present

## 2014-12-16 DIAGNOSIS — Z79899 Other long term (current) drug therapy: Secondary | ICD-10-CM | POA: Diagnosis not present

## 2014-12-16 DIAGNOSIS — Z0000 Encounter for general adult medical examination without abnormal findings: Secondary | ICD-10-CM | POA: Diagnosis not present

## 2014-12-16 DIAGNOSIS — I10 Essential (primary) hypertension: Secondary | ICD-10-CM | POA: Diagnosis not present

## 2014-12-16 DIAGNOSIS — Z1389 Encounter for screening for other disorder: Secondary | ICD-10-CM | POA: Diagnosis not present

## 2014-12-16 DIAGNOSIS — I129 Hypertensive chronic kidney disease with stage 1 through stage 4 chronic kidney disease, or unspecified chronic kidney disease: Secondary | ICD-10-CM | POA: Diagnosis not present

## 2014-12-16 DIAGNOSIS — N183 Chronic kidney disease, stage 3 (moderate): Secondary | ICD-10-CM | POA: Diagnosis not present

## 2015-03-15 DIAGNOSIS — Z79899 Other long term (current) drug therapy: Secondary | ICD-10-CM | POA: Diagnosis not present

## 2015-03-15 DIAGNOSIS — L400 Psoriasis vulgaris: Secondary | ICD-10-CM | POA: Diagnosis not present

## 2015-04-12 DIAGNOSIS — N3020 Other chronic cystitis without hematuria: Secondary | ICD-10-CM | POA: Diagnosis not present

## 2015-04-12 DIAGNOSIS — Z0000 Encounter for general adult medical examination without abnormal findings: Secondary | ICD-10-CM | POA: Diagnosis not present

## 2015-04-12 DIAGNOSIS — R350 Frequency of micturition: Secondary | ICD-10-CM | POA: Diagnosis not present

## 2015-06-20 DIAGNOSIS — Z23 Encounter for immunization: Secondary | ICD-10-CM | POA: Diagnosis not present

## 2015-06-20 DIAGNOSIS — K5900 Constipation, unspecified: Secondary | ICD-10-CM | POA: Diagnosis not present

## 2015-06-20 DIAGNOSIS — I10 Essential (primary) hypertension: Secondary | ICD-10-CM | POA: Diagnosis not present

## 2015-06-20 DIAGNOSIS — M1990 Unspecified osteoarthritis, unspecified site: Secondary | ICD-10-CM | POA: Diagnosis not present

## 2015-06-20 DIAGNOSIS — N183 Chronic kidney disease, stage 3 (moderate): Secondary | ICD-10-CM | POA: Diagnosis not present

## 2015-06-20 DIAGNOSIS — I129 Hypertensive chronic kidney disease with stage 1 through stage 4 chronic kidney disease, or unspecified chronic kidney disease: Secondary | ICD-10-CM | POA: Diagnosis not present

## 2015-06-21 DIAGNOSIS — N183 Chronic kidney disease, stage 3 (moderate): Secondary | ICD-10-CM | POA: Diagnosis not present

## 2015-06-27 DIAGNOSIS — H6981 Other specified disorders of Eustachian tube, right ear: Secondary | ICD-10-CM | POA: Diagnosis not present

## 2015-06-27 DIAGNOSIS — H7201 Central perforation of tympanic membrane, right ear: Secondary | ICD-10-CM | POA: Diagnosis not present

## 2015-06-27 DIAGNOSIS — H906 Mixed conductive and sensorineural hearing loss, bilateral: Secondary | ICD-10-CM | POA: Diagnosis not present

## 2015-09-04 DIAGNOSIS — Z1231 Encounter for screening mammogram for malignant neoplasm of breast: Secondary | ICD-10-CM | POA: Diagnosis not present

## 2015-10-02 DIAGNOSIS — Z79899 Other long term (current) drug therapy: Secondary | ICD-10-CM | POA: Diagnosis not present

## 2015-10-02 DIAGNOSIS — L400 Psoriasis vulgaris: Secondary | ICD-10-CM | POA: Diagnosis not present

## 2015-10-30 DIAGNOSIS — L400 Psoriasis vulgaris: Secondary | ICD-10-CM | POA: Diagnosis not present

## 2015-10-30 DIAGNOSIS — Z79899 Other long term (current) drug therapy: Secondary | ICD-10-CM | POA: Diagnosis not present

## 2015-12-18 DIAGNOSIS — N183 Chronic kidney disease, stage 3 (moderate): Secondary | ICD-10-CM | POA: Diagnosis not present

## 2015-12-18 DIAGNOSIS — Z79899 Other long term (current) drug therapy: Secondary | ICD-10-CM | POA: Diagnosis not present

## 2015-12-18 DIAGNOSIS — M19042 Primary osteoarthritis, left hand: Secondary | ICD-10-CM | POA: Diagnosis not present

## 2015-12-18 DIAGNOSIS — M25562 Pain in left knee: Secondary | ICD-10-CM | POA: Diagnosis not present

## 2015-12-18 DIAGNOSIS — Z0000 Encounter for general adult medical examination without abnormal findings: Secondary | ICD-10-CM | POA: Diagnosis not present

## 2015-12-18 DIAGNOSIS — M19041 Primary osteoarthritis, right hand: Secondary | ICD-10-CM | POA: Diagnosis not present

## 2015-12-18 DIAGNOSIS — Z23 Encounter for immunization: Secondary | ICD-10-CM | POA: Diagnosis not present

## 2015-12-18 DIAGNOSIS — Z1389 Encounter for screening for other disorder: Secondary | ICD-10-CM | POA: Diagnosis not present

## 2015-12-18 DIAGNOSIS — I129 Hypertensive chronic kidney disease with stage 1 through stage 4 chronic kidney disease, or unspecified chronic kidney disease: Secondary | ICD-10-CM | POA: Diagnosis not present

## 2016-02-06 DIAGNOSIS — Z79899 Other long term (current) drug therapy: Secondary | ICD-10-CM | POA: Diagnosis not present

## 2016-02-06 DIAGNOSIS — L400 Psoriasis vulgaris: Secondary | ICD-10-CM | POA: Diagnosis not present

## 2016-03-21 DIAGNOSIS — B9789 Other viral agents as the cause of diseases classified elsewhere: Secondary | ICD-10-CM | POA: Diagnosis not present

## 2016-03-21 DIAGNOSIS — J069 Acute upper respiratory infection, unspecified: Secondary | ICD-10-CM | POA: Diagnosis not present

## 2016-04-08 DIAGNOSIS — L400 Psoriasis vulgaris: Secondary | ICD-10-CM | POA: Diagnosis not present

## 2016-04-08 DIAGNOSIS — M1712 Unilateral primary osteoarthritis, left knee: Secondary | ICD-10-CM | POA: Diagnosis not present

## 2016-04-08 DIAGNOSIS — L821 Other seborrheic keratosis: Secondary | ICD-10-CM | POA: Diagnosis not present

## 2016-04-08 DIAGNOSIS — Z79899 Other long term (current) drug therapy: Secondary | ICD-10-CM | POA: Diagnosis not present

## 2016-04-16 DIAGNOSIS — N3946 Mixed incontinence: Secondary | ICD-10-CM | POA: Diagnosis not present

## 2016-04-16 DIAGNOSIS — N3020 Other chronic cystitis without hematuria: Secondary | ICD-10-CM | POA: Diagnosis not present

## 2016-04-25 DIAGNOSIS — Z79899 Other long term (current) drug therapy: Secondary | ICD-10-CM | POA: Diagnosis not present

## 2016-04-26 DIAGNOSIS — Z79899 Other long term (current) drug therapy: Secondary | ICD-10-CM | POA: Diagnosis not present

## 2016-06-06 DIAGNOSIS — M1712 Unilateral primary osteoarthritis, left knee: Secondary | ICD-10-CM | POA: Diagnosis not present

## 2016-06-17 DIAGNOSIS — M1712 Unilateral primary osteoarthritis, left knee: Secondary | ICD-10-CM | POA: Diagnosis not present

## 2016-06-26 DIAGNOSIS — M1712 Unilateral primary osteoarthritis, left knee: Secondary | ICD-10-CM | POA: Diagnosis not present

## 2016-07-22 DIAGNOSIS — M1712 Unilateral primary osteoarthritis, left knee: Secondary | ICD-10-CM | POA: Diagnosis not present

## 2016-07-31 ENCOUNTER — Encounter (HOSPITAL_COMMUNITY): Payer: Self-pay | Admitting: Physician Assistant

## 2016-07-31 DIAGNOSIS — I129 Hypertensive chronic kidney disease with stage 1 through stage 4 chronic kidney disease, or unspecified chronic kidney disease: Secondary | ICD-10-CM | POA: Diagnosis not present

## 2016-07-31 DIAGNOSIS — M1712 Unilateral primary osteoarthritis, left knee: Secondary | ICD-10-CM | POA: Diagnosis present

## 2016-07-31 DIAGNOSIS — N183 Chronic kidney disease, stage 3 (moderate): Secondary | ICD-10-CM | POA: Diagnosis not present

## 2016-07-31 DIAGNOSIS — M1711 Unilateral primary osteoarthritis, right knee: Secondary | ICD-10-CM | POA: Diagnosis present

## 2016-07-31 NOTE — H&P (Signed)
TOTAL KNEE ADMISSION H&P  Patient is being admitted for left total knee arthroplasty.  Subjective:  Chief Complaint:left knee pain.  HPI: Shannon Mcintyre, 83 y.o. female, has a history of pain and functional disability in the left knee due to arthritis and has failed non-surgical conservative treatments for greater than 12 weeks to includeNSAID's and/or analgesics, corticosteriod injections, viscosupplementation injections, flexibility and strengthening excercises, supervised PT with diminished ADL's post treatment, use of assistive devices and activity modification.  Onset of symptoms was gradual, starting 10 years ago with gradually worsening course since that time. The patient noted prior procedures on the knee to include  arthroscopy and menisectomy on the left knee(s).  Patient currently rates pain in the left knee(s) at 10 out of 10 with activity. Patient has night pain, worsening of pain with activity and weight bearing, pain that interferes with activities of daily living, crepitus and joint swelling.  Patient has evidence of subchondral sclerosis, periarticular osteophytes and joint space narrowing by imaging studies.  There is no active infection.  Patient Active Problem List   Diagnosis Date Noted  . Primary localized osteoarthritis of left knee   . Hyperlipidemia 11/12/2011  . Eczema 11/05/2010  . Essential hypertension 08/01/2009  . ARTHRITIS 03/29/2008  . DIVERTICULOSIS, COLON 09/17/2006   Past Medical History:  Diagnosis Date  . Arthritis   . Diverticulosis of colon   . Eczema   . Hypertension   . Primary localized osteoarthritis of left knee     Past Surgical History:  Procedure Laterality Date  . ABDOMINAL HYSTERECTOMY  1980   BSO  . APPENDECTOMY  1980  . knee arthoscopy  2010   right  . KNEE ARTHROSCOPY  2001   left    No current facility-administered medications for this encounter.   Current Outpatient Prescriptions:  .  aspirin EC 81 MG tablet, Take 81  mg by mouth daily., Disp: , Rfl:  .  folic acid (FOLVITE) 1 MG tablet, Take 1 mg by mouth daily., Disp: , Rfl:  .  hydrOXYzine (ATARAX/VISTARIL) 25 MG tablet, Take 25 mg by mouth daily as needed for itching. , Disp: , Rfl:  .  losartan (COZAAR) 25 MG tablet, Take 25 mg by mouth daily., Disp: , Rfl:  .  methotrexate (RHEUMATREX) 2.5 MG tablet, Take 5-7.5 mg by mouth 3 (three) times a week. Caution:Chemotherapy. Protect from light.  Tuesday 7.5 mg in the am and 5mg  in the am and Wednesday 7.5 mg in the am, Disp: , Rfl:  .  nitrofurantoin (MACRODANTIN) 100 MG capsule, Take 100 mg by mouth daily as needed (prevent infections)., Disp: , Rfl:  .  VESICARE 5 MG tablet, TAKE 1 TABLET BY MOUTH DAILY., Disp: 90 tablet, Rfl: 0    No Known Allergies  Social History  Substance Use Topics  . Smoking status: Never Smoker  . Smokeless tobacco: Not on file  . Alcohol use Yes    Family History  Problem Relation Age of Onset  . Heart attack Mother 56  . Heart attack Father 15     Review of Systems  Constitutional: Negative.   HENT: Negative.   Eyes: Negative.   Respiratory: Negative.   Cardiovascular: Negative.   Gastrointestinal: Negative.   Genitourinary: Negative.   Musculoskeletal: Positive for joint pain.  Skin: Negative.   Neurological: Negative.   Endo/Heme/Allergies: Negative.   Psychiatric/Behavioral: Negative.     Objective:  Physical Exam  Constitutional: She is oriented to person, place, and time. She appears well-developed  and well-nourished.  HENT:  Head: Normocephalic and atraumatic.  Mouth/Throat: Oropharynx is clear and moist.  Eyes: Conjunctivae are normal. Pupils are equal, round, and reactive to light.  Neck: Neck supple.  Cardiovascular: Normal rate and regular rhythm.   Respiratory: Effort normal.  GI: Soft.  Musculoskeletal:  Examination of her left knee reveals pain with 1+ synovitis. Mild varus deformity. The range of motion is from -5 to 125 degrees. The knee  is stable with normal patellar tracking. Examination of the right knee reveals a full range of motion without pain, swelling weakness or instability. Vascular exam: pulses 2+ and symmetric. Neurologic, distal motor and sensory examinations within normal limits.   Neurological: She is alert and oriented to person, place, and time.  Skin: Skin is warm and dry.  Psychiatric: She has a normal mood and affect.    Vital signs in last 24 hours: Temp:  [97.9 F (36.6 C)] 97.9 F (36.6 C) (05/02 1600) Pulse Rate:  [79] 79 (05/02 1600) BP: (163)/(68) 163/68 (05/02 1600) SpO2:  [98 %] 98 % (05/02 1600) Weight:  [68 kg (150 lb)] 68 kg (150 lb) (05/02 1600)  Labs:   Estimated body mass index is 24.96 kg/m as calculated from the following:   Height as of this encounter: 5\' 5"  (1.651 m).   Weight as of this encounter: 68 kg (150 lb).   Imaging Review Plain radiographs demonstrate severe degenerative joint disease of the left knee(s). The overall alignment issignificant valgus. The bone quality appears to be good for age and reported activity level.  Assessment/Plan:  End stage arthritis, left knee  Principal Problem:   Primary localized osteoarthritis of left knee Active Problems:   Essential hypertension   Eczema   Hyperlipidemia    The patient history, physical examination, clinical judgment of the provider and imaging studies are consistent with end stage degenerative joint disease of the left knee(s) and total knee arthroplasty is deemed medically necessary. The treatment options including medical management, injection therapy arthroscopy and arthroplasty were discussed at length. The risks and benefits of total knee arthroplasty were presented and reviewed. The risks due to aseptic loosening, infection, stiffness, patella tracking problems, thromboembolic complications and other imponderables were discussed. The patient acknowledged the explanation, agreed to proceed with the plan and  consent was signed. Patient is being admitted for inpatient treatment for surgery, pain control, PT, OT, prophylactic antibiotics, VTE prophylaxis, progressive ambulation and ADL's and discharge planning. The patient is planning to be discharged home with home health services

## 2016-08-01 ENCOUNTER — Inpatient Hospital Stay (HOSPITAL_COMMUNITY)
Admission: RE | Admit: 2016-08-01 | Discharge: 2016-08-01 | Disposition: A | Discharge status: Home / Self Care | Payer: Medicare Other | Source: Ambulatory Visit

## 2016-08-01 NOTE — Pre-Procedure Instructions (Signed)
Shannon Mcintyre  08/01/2016      [id number] - [place], [place] - [address] [place] [address] Phone: [phone] Fax: [phone]  [facility] - [address] [place] - [address] [place] [address] Phone: [phone] Fax: [phone]  [facility] - [place], [place] - [place] [address] [place] [address] Phone: [phone] Fax: [phone]    Your procedure is scheduled on Monday, Aug 12, 2016  Report to [facility] Admitting at 9:00 A.M.  Call this number if you have problems the morning of surgery:  [phone]   Remember:  Do not eat food or drink liquids after midnight Sunday, Aug 11, 2016  Take these medicines the morning of surgery with A SIP OF WATER : if needed: hydrOXYzine (ATARAX/VISTARIL) for itching Stop taking Aspirin, vitamins, fish oil, and herbal medications. Do not take any NSAIDs ie: Ibuprofen, Advil, Naproxen, BC and Goody Powder or any medication containing Aspirin; stop Monday, Aug 05, 2016  Do not wear jewelry, make-up or nail polish.  Do not wear lotions, powders, or perfumes, or deoderant.  Do not shave 48 hours prior to surgery.    Do not bring valuables to the hospital.  [facility] is not responsible for any belongings or valuables.  Contacts, dentures or bridgework may not be worn into surgery.  Leave your suitcase in the car.  After surgery it may be brought to your room.  For patients admitted to the hospital, discharge time will be determined by your treatment team. Special instructions:  [facility] - Preparing for Surgery  Before surgery, you can play an important role.  Because skin is not sterile, your skin needs to be as free of germs as possible.  You can reduce the number of germs on you skin by washing with CHG (chlorahexidine gluconate) soap before surgery.  CHG is an antiseptic cleaner which  kills germs and bonds with the skin to continue killing germs even after washing.  Please DO NOT use if you have an allergy to CHG or antibacterial soaps.  If your skin becomes reddened/irritated stop using the CHG and inform your nurse when you arrive at Short Stay.  Do not shave (including legs and underarms) for at least 48 hours prior to the first CHG shower.  You may shave your face.  Please follow these instructions carefully:   1.  Shower with CHG Soap the night before surgery and the morning of Surgery.  2.  If you choose to wash your hair, wash your hair first as usual with your normal shampoo.  3.  After you shampoo, rinse your hair and body thoroughly to remove the Shampoo.  4.  Use CHG as you would any other liquid soap.  You can apply chg directly  to the skin and wash gently with scrungie or a clean washcloth.  5.  Apply the CHG Soap to your body ONLY FROM THE NECK DOWN.  Do not use on open wounds or open sores.  Avoid contact with your eyes, ears, mouth and genitals (private parts).  Wash genitals (private parts) with your normal soap.  6.  Wash thoroughly, paying special attention to the area where your surgery will be performed.  7.  Thoroughly rinse your body with warm water from the neck down.  8.  DO NOT shower/wash with your normal soap after  using and rinsing off the CHG Soap.  9.  Pat yourself dry with a clean towel.            10.  Wear clean pajamas.            11.  Place clean sheets on your bed the night of your first shower and do not sleep with pets.  Day of Surgery  Do not apply any lotions/deoderants the morning of surgery.  Please wear clean clothes to the hospital/surgery center.  Please read over the following fact sheets that you were given. Pain Booklet, Coughing and Deep Breathing, Blood Transfusion Information, Total Joint Packet, MRSA Information and Surgical Site Infection Prevention

## 2016-08-02 ENCOUNTER — Encounter (HOSPITAL_COMMUNITY)
Admission: RE | Admit: 2016-08-02 | Discharge: 2016-08-02 | Disposition: A | Discharge status: Home / Self Care | Payer: Medicare Other | Source: Ambulatory Visit | Attending: Orthopedic Surgery | Admitting: Orthopedic Surgery

## 2016-08-02 ENCOUNTER — Encounter (HOSPITAL_COMMUNITY): Payer: Self-pay

## 2016-08-02 DIAGNOSIS — E785 Hyperlipidemia, unspecified: Secondary | ICD-10-CM | POA: Insufficient documentation

## 2016-08-02 DIAGNOSIS — I10 Essential (primary) hypertension: Secondary | ICD-10-CM | POA: Insufficient documentation

## 2016-08-02 DIAGNOSIS — Z01810 Encounter for preprocedural cardiovascular examination: Secondary | ICD-10-CM | POA: Insufficient documentation

## 2016-08-02 DIAGNOSIS — Z01812 Encounter for preprocedural laboratory examination: Secondary | ICD-10-CM | POA: Insufficient documentation

## 2016-08-02 DIAGNOSIS — M1990 Unspecified osteoarthritis, unspecified site: Secondary | ICD-10-CM | POA: Insufficient documentation

## 2016-08-02 DIAGNOSIS — M1712 Unilateral primary osteoarthritis, left knee: Secondary | ICD-10-CM | POA: Insufficient documentation

## 2016-08-02 LAB — COMPREHENSIVE METABOLIC PANEL
ALBUMIN: 4.8 g/dL (ref 3.5–5.0)
ALK PHOS: 67 U/L (ref 38–126)
ALT: 32 U/L (ref 14–54)
ANION GAP: 10 (ref 5–15)
AST: 34 U/L (ref 15–41)
BUN: 18 mg/dL (ref 6–20)
CALCIUM: 9.8 mg/dL (ref 8.9–10.3)
CO2: 26 mmol/L (ref 22–32)
Chloride: 104 mmol/L (ref 101–111)
Creatinine, Ser: 1 mg/dL (ref 0.44–1.00)
GFR calc non Af Amer: 51 mL/min — ABNORMAL LOW (ref >60)
GFR, EST AFRICAN AMERICAN: 60 mL/min — AB (ref >60)
GLUCOSE: 80 mg/dL (ref 65–99)
POTASSIUM: 4.3 mmol/L (ref 3.5–5.1)
SODIUM: 140 mmol/L (ref 135–145)
Total Bilirubin: 1.1 mg/dL (ref 0.3–1.2)
Total Protein: 7.5 g/dL (ref 6.5–8.1)

## 2016-08-02 LAB — CBC WITH DIFFERENTIAL/PLATELET
BASOS PCT: 0 %
Basophils Absolute: 0 10*3/uL (ref 0.0–0.1)
EOS ABS: 0.1 10*3/uL (ref 0.0–0.7)
EOS PCT: 2 %
HCT: 39.7 % (ref 36.0–46.0)
HEMOGLOBIN: 13.4 g/dL (ref 12.0–15.0)
LYMPHS ABS: 1.8 10*3/uL (ref 0.7–4.0)
Lymphocytes Relative: 30 %
MCH: 32.1 pg (ref 26.0–34.0)
MCHC: 33.8 g/dL (ref 30.0–36.0)
MCV: 95.2 fL (ref 78.0–100.0)
MONO ABS: 0.5 10*3/uL (ref 0.1–1.0)
MONOS PCT: 8 %
NEUTROS PCT: 60 %
Neutro Abs: 3.7 10*3/uL (ref 1.7–7.7)
PLATELETS: 223 10*3/uL (ref 150–400)
RBC: 4.17 MIL/uL (ref 3.87–5.11)
RDW: 14.7 % (ref 11.5–15.5)
WBC: 6.1 10*3/uL (ref 4.0–10.5)

## 2016-08-02 LAB — TYPE AND SCREEN
ABO/RH(D): A POS
ANTIBODY SCREEN: NEGATIVE

## 2016-08-02 LAB — PROTIME-INR
INR: 1.02
Prothrombin Time: 13.4 seconds (ref 11.4–15.2)

## 2016-08-02 LAB — SURGICAL PCR SCREEN
MRSA, PCR: NEGATIVE
STAPHYLOCOCCUS AUREUS: NEGATIVE

## 2016-08-02 LAB — APTT: aPTT: 29 seconds (ref 24–36)

## 2016-08-02 LAB — ABO/RH: ABO/RH(D): A POS

## 2016-08-02 NOTE — Pre-Procedure Instructions (Addendum)
Shannon Mcintyre  08/02/2016      [facility] - [place], [place] - [address] [place] [address] Phone: [phone] Fax: [phone]  [facility] - [address], [place] - [address] [place] [address] Phone: [phone] Fax: [phone]  [facility] - [place], [place] - [place] [address] [place] [address] Phone: [phone] Fax: [phone]    Your procedure is scheduled on Monday, Aug 12, 2016  Report to [facility] Admitting at 9:00 A.M.  Call this number if you have problems the morning of surgery:  [phone]   Remember:  Do not eat food or drink liquids after midnight Sunday, Aug 11, 2016  Take these medicines the morning of surgery with A SIP OF WATER : if needed: hydrOXYzine (ATARAX/VISTARIL) for itching  Stop taking Aspirin,all vitamins, fish oil, and herbal medications. Do not take any NSAIDs ie: Ibuprofen, Advil, Naproxen, BC and Goody Powder or any medication containing Aspirin; stop Monday, Aug 05, 2016   Do not wear jewelry, make-up or nail polish.  Do not wear lotions, powders, or perfumes, or deoderant.  Do not shave 48 hours prior to surgery.    Do not bring valuables to the hospital.  [facility] is not responsible for any belongings or valuables.  Contacts, dentures or bridgework may not be worn into surgery.  Leave your suitcase in the car.  After surgery it may be brought to your room.  For patients admitted to the hospital, discharge time will be determined by your treatment team. Special instructions:  [facility] - Preparing for Surgery  Before surgery, you can play an important role.  Because skin is not sterile, your skin needs to be as free of germs as possible.  You can reduce the number of germs on you skin by washing with CHG (chlorahexidine gluconate) soap before surgery.  CHG is an antiseptic cleaner  which kills germs and bonds with the skin to continue killing germs even after washing.  Please DO NOT use if you have an allergy to CHG or antibacterial soaps.  If your skin becomes reddened/irritated stop using the CHG and inform your nurse when you arrive at Short Stay.  Do not shave (including legs and underarms) for at least 48 hours prior to the first CHG shower.  You may shave your face.  Please follow these instructions carefully:   1.  Shower with CHG Soap the night before surgery and the morning of Surgery.  2.  If you choose to wash your hair, wash your hair first as usual with your normal shampoo.  3.  After you shampoo, rinse your hair and body thoroughly to remove the Shampoo.  4.  Use CHG as you would any other liquid soap.  You can apply chg directly  to the skin and wash gently with scrungie or a clean washcloth.  5.  Apply the CHG Soap to your body ONLY FROM THE NECK DOWN.  Do not use on open wounds or open sores.  Avoid contact with your eyes, ears, mouth and genitals (private parts).  Wash genitals (private parts) with your normal soap.  6.  Wash thoroughly, paying special attention to the area where your surgery will be performed.  7.  Thoroughly rinse your body with warm water from the neck down.  8.  DO NOT shower/wash with your normal  soap after using and rinsing off the CHG Soap.  9.  Pat yourself dry with a clean towel.            10.  Wear clean pajamas.            11.  Place clean sheets on your bed the night of your first shower and do not sleep with pets.  Day of Surgery  Do not apply any lotions/deoderants the morning of surgery.  Please wear clean clothes to the hospital/surgery center.  Please read over the  fact sheets that you were given.

## 2016-08-03 LAB — URINE CULTURE

## 2016-08-05 DIAGNOSIS — L400 Psoriasis vulgaris: Secondary | ICD-10-CM | POA: Diagnosis not present

## 2016-08-05 DIAGNOSIS — Z79899 Other long term (current) drug therapy: Secondary | ICD-10-CM | POA: Diagnosis not present

## 2016-08-05 NOTE — Progress Notes (Signed)
Cardiology Office Note    Date:  08/06/2016  ID:  Shannon Mcintyre, DOB 01-04-1933, MRN [id number] PCP:  Merlene Laughter, MD  Cardiologist:  New, reviewed with Dr. Ladona Ridgel   Chief Complaint: pre-op checkup  History of Present Illness:  Shannon Mcintyre is a 83 y.o. female with history of HTN, hyperlipidemia, arthritis whom we are asked to see for pre-operative evaluation. She was recently seen by orthopedics and felt to have end-stage degenerative joint disease of the left knee and total knee arthroplasty is deemed necessary. Last labs 07/2016 showed K 4.3, Cr 1.0, LFTs wnl, CBC wnl. Prior lipids 2015 showed LDL 148, trig 110, HDL 66. EKG at pre-op appt 08/02/16 showed new RBBB compared to 2005. She is referred for pre-operative cardiac evaluation by Dr. Thurston Hole.  She is here with her husband today. No prior history of heart disease or cardiac workup. In general aside from above has been very healthy her whole life. Up until her knee started giving out 2 months ago, was exercising 2x a week, walking regularly and gardening without any functional limitation. Her activity has been limited by her OA since that time but she has not developed any interim cardiac symptoms with ADLs, walking and stairs. No chest pain, dyspnea, palpitations, LEE, orthopnea, dizziness or syncope. + Fam hx CAD - father died of suspected MI at age 28, patient recalls him taking NTG for several years beforehand - he did smoke. The patient denies any tob, EtOH, drug use.   Past Medical History:  Diagnosis Date  . Arthritis   . Diverticulosis of colon   . Eczema   . Hypertension   . Primary localized osteoarthritis of left knee     Past Surgical History:  Procedure Laterality Date  . ABDOMINAL HYSTERECTOMY  1980   BSO  . APPENDECTOMY  1980  . knee arthoscopy  2010   right  . KNEE ARTHROSCOPY  2001   left    Current Medications: Current Outpatient Prescriptions  Medication Sig Dispense Refill  . aspirin EC 81 MG  tablet Take 81 mg by mouth daily.    . folic acid (FOLVITE) 1 MG tablet Take 1 mg by mouth daily.    . hydrOXYzine (ATARAX/VISTARIL) 25 MG tablet Take 25 mg by mouth daily as needed for itching.     . losartan (COZAAR) 25 MG tablet Take 25 mg by mouth daily.    . methotrexate (RHEUMATREX) 2.5 MG tablet Take 5-7.5 mg by mouth 3 (three) times a week. Caution:Chemotherapy. Protect from light.  Tuesday 7.5 mg in the am and 5mg  in the am and Wednesday 7.5 mg in the am    . nitrofurantoin (MACRODANTIN) 100 MG capsule Take 100 mg by mouth daily as needed (prevent infections).    . VESICARE 5 MG tablet TAKE 1 TABLET BY MOUTH DAILY. 90 tablet 0   No current facility-administered medications for this visit.      Allergies:   Patient has no known allergies.   Social History   Social History  . Marital status: Married    Spouse name: N/A  . Number of children: N/A  . Years of education: N/A   Social History Main Topics  . Smoking status: Never Smoker  . Smokeless tobacco: Never Used  . Alcohol use No  . Drug use: No  . Sexual activity: Not on file   Other Topics Concern  . Not on file   Social History Narrative  . No narrative on file  Family History:  Family History  Problem Relation Age of Onset  . Pneumonia Mother 33  . Heart attack Father 59    Possibly CAD starting in his 70s  . Parkinson's disease Brother     ROS:   Please see the history of present illness.  All other systems are reviewed and otherwise negative.    PHYSICAL EXAM:   VS:  BP 140/78   Pulse 84   Ht 5\' 5"  (1.651 m)   Wt 146 lb 12 oz (66.6 kg)   SpO2 98%   BMI 24.42 kg/m   BMI: Body mass index is 24.42 kg/m. GEN: Well nourished, well developed WF, in no acute distress  HEENT: normocephalic, atraumatic Neck: no JVD, carotid bruits, or masses Cardiac: RRR; no murmurs, rubs, or gallops, no edema  Respiratory:  clear to auscultation bilaterally, normal work of breathing GI: soft, nontender,  nondistended, + BS MS: no deformity or atrophy  Skin: warm and dry, no rash Neuro:  Alert and Oriented x 3, Strength and sensation are intact, follows commands Psych: euthymic mood, full affect  Wt Readings from Last 3 Encounters:  08/06/16 146 lb 12 oz (66.6 kg)  08/02/16 146 lb 4.8 oz (66.4 kg)  06/22/13 156 lb (70.8 kg)      Studies/Labs Reviewed:   EKG:  EKG was ordered today and personally reviewed by me and demonstrates NSR 80bpm, mostly RBBB pattern but also one beat appearing to be a narrow QRS and one PVC, nonspecific T wave changes  Recent Labs: 08/02/2016: ALT 32; BUN 18; Creatinine, Ser 1.00; Hemoglobin 13.4; Platelets 223; Potassium 4.3; Sodium 140   Lipid Panel    Component Value Date/Time   CHOL 229 (H) 12/22/2012 1024   TRIG 110.0 12/22/2012 1024   HDL 66.60 12/22/2012 1024   CHOLHDL 3 12/22/2012 1024   VLDL 22.0 12/22/2012 1024   LDLDIRECT 148.2 12/22/2012 1024    Additional studies/ records that were reviewed today include: Summarized above.    ASSESSMENT & PLAN:   1. Pre-operative evaluation - asymptomatic with overall good functional capacity prior to progressive knee pain. Reviewed with Dr. 12/24/2012 since Ms. Gootee is a new patient to our clinic. Per our discussion, patient is low risk from pre-operative cardiovascular standpoint and does not require any further cardiovascular testing at this time. OK to proceed with surgery. 2. RBBB  - unlikely to be of any clinical significance at this time. No history of bradycardia or angina. No active pulmonary symptoms. Continue surveillance for any cardiac symptoms. 3. Isolated PVC - patient denies palpitations. Follow clinically for now. 4. Essential HTN - BP upper range of normal, may be exacerbated by knee pain recently. Would follow for now. 5. Hyperlipidemia - lipids followed by primary care.  Disposition: F/u with cardiology as needed.    Medication Adjustments/Labs and Tests Ordered: Current medicines  are reviewed at length with the patient today.  Concerns regarding medicines are outlined above. Medication changes, Labs and Tests ordered today are summarized above and listed in the Patient Instructions accessible in Encounters.   Halina Maidens PA-C  08/06/2016 10:40 AM    [facility] [address] [place], [place], [place]  [place] Phone: [phone]; Fax: [phone]

## 2016-08-06 ENCOUNTER — Ambulatory Visit (INDEPENDENT_AMBULATORY_CARE_PROVIDER_SITE_OTHER): Payer: Medicare Other | Admitting: Physician Assistant

## 2016-08-06 ENCOUNTER — Encounter: Payer: Self-pay | Admitting: Physician Assistant

## 2016-08-06 VITALS — BP 140/78 | HR 84 | Ht 65.0 in | Wt 146.8 lb

## 2016-08-06 DIAGNOSIS — I4510 Unspecified right bundle-branch block: Secondary | ICD-10-CM | POA: Diagnosis not present

## 2016-08-06 DIAGNOSIS — I493 Ventricular premature depolarization: Secondary | ICD-10-CM

## 2016-08-06 DIAGNOSIS — I10 Essential (primary) hypertension: Secondary | ICD-10-CM

## 2016-08-06 DIAGNOSIS — E7800 Pure hypercholesterolemia, unspecified: Secondary | ICD-10-CM

## 2016-08-06 DIAGNOSIS — Z01810 Encounter for preprocedural cardiovascular examination: Secondary | ICD-10-CM

## 2016-08-06 NOTE — Patient Instructions (Addendum)
Medication Instructions:  Your physician recommends that you continue on your current medications as directed. Please refer to the Current Medication list given to you today.   Labwork: None ordered  Testing/Procedures: None ordered  Follow-Up: Your physician recommends that you schedule a follow-up appointment in: AS NEEDED   Any Other Special Instructions Will Be Listed Below (If Applicable).     If you need a refill on your cardiac medications before your next appointment, please call your pharmacy.   

## 2016-08-09 ENCOUNTER — Other Ambulatory Visit: Payer: Self-pay | Admitting: Orthopedic Surgery

## 2016-08-09 MED ORDER — CEFAZOLIN SODIUM-DEXTROSE 2-4 GM/100ML-% IV SOLN
2.0000 g | INTRAVENOUS | Status: AC
  Administered 2016-08-12: 2 g via INTRAVENOUS
  Filled 2016-08-09: qty 100

## 2016-08-09 MED ORDER — LACTATED RINGERS IV SOLN
INTRAVENOUS | Status: DC
  Administered 2016-08-12 (×2): via INTRAVENOUS

## 2016-08-09 MED ORDER — TRANEXAMIC ACID 1000 MG/10ML IV SOLN
1000.0000 mg | INTRAVENOUS | Status: AC
  Administered 2016-08-12: 1000 mg via INTRAVENOUS
  Filled 2016-08-09: qty 10

## 2016-08-12 ENCOUNTER — Inpatient Hospital Stay (HOSPITAL_COMMUNITY): Payer: Medicare Other | Admitting: Anesthesiology

## 2016-08-12 ENCOUNTER — Inpatient Hospital Stay (HOSPITAL_COMMUNITY)
Admission: RE | Admit: 2016-08-12 | Discharge: 2016-08-15 | DRG: 470 | Disposition: A | Discharge status: Home Health | Payer: Medicare Other | Source: Ambulatory Visit | Attending: Orthopedic Surgery | Admitting: Orthopedic Surgery

## 2016-08-12 ENCOUNTER — Encounter (HOSPITAL_COMMUNITY)
Admission: RE | Disposition: A | Discharge status: Home Health | Payer: Self-pay | Source: Ambulatory Visit | Attending: Orthopedic Surgery

## 2016-08-12 ENCOUNTER — Encounter (HOSPITAL_COMMUNITY): Payer: Self-pay | Admitting: *Deleted

## 2016-08-12 DIAGNOSIS — T8029XA Infection following other infusion, transfusion and therapeutic injection, initial encounter: Secondary | ICD-10-CM | POA: Diagnosis not present

## 2016-08-12 DIAGNOSIS — Z90710 Acquired absence of both cervix and uterus: Secondary | ICD-10-CM | POA: Diagnosis not present

## 2016-08-12 DIAGNOSIS — L309 Dermatitis, unspecified: Secondary | ICD-10-CM | POA: Diagnosis present

## 2016-08-12 DIAGNOSIS — Y848 Other medical procedures as the cause of abnormal reaction of the patient, or of later complication, without mention of misadventure at the time of the procedure: Secondary | ICD-10-CM | POA: Diagnosis not present

## 2016-08-12 DIAGNOSIS — E785 Hyperlipidemia, unspecified: Secondary | ICD-10-CM | POA: Diagnosis present

## 2016-08-12 DIAGNOSIS — Z8249 Family history of ischemic heart disease and other diseases of the circulatory system: Secondary | ICD-10-CM

## 2016-08-12 DIAGNOSIS — M1712 Unilateral primary osteoarthritis, left knee: Secondary | ICD-10-CM | POA: Diagnosis not present

## 2016-08-12 DIAGNOSIS — I10 Essential (primary) hypertension: Secondary | ICD-10-CM | POA: Diagnosis present

## 2016-08-12 DIAGNOSIS — L03113 Cellulitis of right upper limb: Secondary | ICD-10-CM | POA: Diagnosis not present

## 2016-08-12 DIAGNOSIS — M25562 Pain in left knee: Secondary | ICD-10-CM | POA: Diagnosis not present

## 2016-08-12 DIAGNOSIS — G8918 Other acute postprocedural pain: Secondary | ICD-10-CM | POA: Diagnosis not present

## 2016-08-12 DIAGNOSIS — M1711 Unilateral primary osteoarthritis, right knee: Secondary | ICD-10-CM | POA: Diagnosis present

## 2016-08-12 HISTORY — DX: Unilateral primary osteoarthritis, left knee: M17.12

## 2016-08-12 HISTORY — PX: TOTAL KNEE ARTHROPLASTY: SHX125

## 2016-08-12 SURGERY — ARTHROPLASTY, KNEE, TOTAL
Anesthesia: Spinal | Site: Knee | Laterality: Left

## 2016-08-12 MED ORDER — ONDANSETRON HCL 4 MG/2ML IJ SOLN
INTRAMUSCULAR | Status: AC
  Filled 2016-08-12: qty 2

## 2016-08-12 MED ORDER — MIDAZOLAM HCL 2 MG/2ML IJ SOLN
1.0000 mg | Freq: Once | INTRAMUSCULAR | Status: AC
  Administered 2016-08-12: 1 mg via INTRAVENOUS

## 2016-08-12 MED ORDER — OXYCODONE HCL 5 MG PO TABS
5.0000 mg | ORAL_TABLET | ORAL | Status: DC | PRN
  Administered 2016-08-12: 5 mg via ORAL
  Administered 2016-08-13 (×3): 10 mg via ORAL
  Administered 2016-08-13: 5 mg via ORAL
  Administered 2016-08-14 – 2016-08-15 (×3): 10 mg via ORAL
  Filled 2016-08-12 (×6): qty 2
  Filled 2016-08-12 (×2): qty 1

## 2016-08-12 MED ORDER — PROMETHAZINE HCL 25 MG/ML IJ SOLN: 6.2500 mg | INTRAMUSCULAR | Status: DC | PRN

## 2016-08-12 MED ORDER — SODIUM CHLORIDE 0.9 % IR SOLN
Status: DC | PRN
  Administered 2016-08-12: 3000 mL

## 2016-08-12 MED ORDER — MIDAZOLAM HCL 2 MG/2ML IJ SOLN
INTRAMUSCULAR | Status: AC
  Filled 2016-08-12: qty 2

## 2016-08-12 MED ORDER — FENTANYL CITRATE (PF) 100 MCG/2ML IJ SOLN
INTRAMUSCULAR | Status: AC
  Filled 2016-08-12: qty 2

## 2016-08-12 MED ORDER — FOLIC ACID 1 MG PO TABS
1.0000 mg | ORAL_TABLET | Freq: Every day | ORAL | Status: DC
  Administered 2016-08-12 – 2016-08-15 (×4): 1 mg via ORAL
  Filled 2016-08-12 (×4): qty 1

## 2016-08-12 MED ORDER — BUPIVACAINE-EPINEPHRINE 0.25% -1:200000 IJ SOLN
INTRAMUSCULAR | Status: DC | PRN
  Administered 2016-08-12: 30 mL

## 2016-08-12 MED ORDER — LOSARTAN POTASSIUM 25 MG PO TABS
25.0000 mg | ORAL_TABLET | Freq: Every day | ORAL | Status: DC
  Administered 2016-08-14 – 2016-08-15 (×2): 25 mg via ORAL
  Filled 2016-08-12 (×2): qty 1

## 2016-08-12 MED ORDER — ONDANSETRON HCL 4 MG PO TABS: 4.0000 mg | ORAL_TABLET | Freq: Four times a day (QID) | ORAL | Status: DC | PRN

## 2016-08-12 MED ORDER — ACETAMINOPHEN 500 MG PO TABS
1000.0000 mg | ORAL_TABLET | Freq: Four times a day (QID) | ORAL | Status: AC
  Administered 2016-08-12 – 2016-08-13 (×4): 1000 mg via ORAL
  Filled 2016-08-12 (×4): qty 2

## 2016-08-12 MED ORDER — DEXAMETHASONE SODIUM PHOSPHATE 10 MG/ML IJ SOLN
INTRAMUSCULAR | Status: AC
  Filled 2016-08-12: qty 1

## 2016-08-12 MED ORDER — PROPOFOL 10 MG/ML IV BOLUS
INTRAVENOUS | Status: DC | PRN
  Administered 2016-08-12: 20 mg via INTRAVENOUS

## 2016-08-12 MED ORDER — ACETAMINOPHEN 325 MG PO TABS: 650.0000 mg | ORAL_TABLET | Freq: Four times a day (QID) | ORAL | Status: DC | PRN

## 2016-08-12 MED ORDER — DEXAMETHASONE SODIUM PHOSPHATE 10 MG/ML IJ SOLN
10.0000 mg | Freq: Three times a day (TID) | INTRAMUSCULAR | Status: AC
  Administered 2016-08-12 – 2016-08-13 (×4): 10 mg via INTRAVENOUS
  Filled 2016-08-12 (×4): qty 1

## 2016-08-12 MED ORDER — MEPERIDINE HCL 25 MG/ML IJ SOLN: 6.2500 mg | INTRAMUSCULAR | Status: DC | PRN

## 2016-08-12 MED ORDER — 0.9 % SODIUM CHLORIDE (POUR BTL) OPTIME
TOPICAL | Status: DC | PRN
  Administered 2016-08-12: 1000 mL

## 2016-08-12 MED ORDER — PROPOFOL 500 MG/50ML IV EMUL
INTRAVENOUS | Status: DC | PRN
  Administered 2016-08-12: 40 ug/kg/min via INTRAVENOUS

## 2016-08-12 MED ORDER — ASPIRIN EC 325 MG PO TBEC
325.0000 mg | DELAYED_RELEASE_TABLET | Freq: Every day | ORAL | Status: DC
  Administered 2016-08-13 – 2016-08-15 (×3): 325 mg via ORAL
  Filled 2016-08-12 (×3): qty 1

## 2016-08-12 MED ORDER — KETOROLAC TROMETHAMINE 30 MG/ML IJ SOLN: 30.0000 mg | Freq: Once | INTRAMUSCULAR | Status: DC | PRN

## 2016-08-12 MED ORDER — BUPIVACAINE HCL (PF) 0.25 % IJ SOLN
INTRAMUSCULAR | Status: AC
  Filled 2016-08-12: qty 30

## 2016-08-12 MED ORDER — CHLORHEXIDINE GLUCONATE 4 % EX LIQD: 60.0000 mL | Freq: Once | CUTANEOUS | Status: DC

## 2016-08-12 MED ORDER — HYDROMORPHONE HCL 1 MG/ML IJ SOLN
1.0000 mg | INTRAMUSCULAR | Status: DC | PRN
  Administered 2016-08-14: 1 mg via INTRAVENOUS
  Filled 2016-08-12: qty 1

## 2016-08-12 MED ORDER — DEXAMETHASONE SODIUM PHOSPHATE 10 MG/ML IJ SOLN
INTRAMUSCULAR | Status: DC | PRN
  Administered 2016-08-12: 10 mg via INTRAVENOUS

## 2016-08-12 MED ORDER — FENTANYL CITRATE (PF) 100 MCG/2ML IJ SOLN: 50.0000 ug | Freq: Once | INTRAMUSCULAR | Status: DC

## 2016-08-12 MED ORDER — LIDOCAINE 2% (20 MG/ML) 5 ML SYRINGE
INTRAMUSCULAR | Status: AC
  Filled 2016-08-12: qty 5

## 2016-08-12 MED ORDER — ONDANSETRON HCL 4 MG/2ML IJ SOLN: 4.0000 mg | Freq: Four times a day (QID) | INTRAMUSCULAR | Status: DC | PRN

## 2016-08-12 MED ORDER — EPINEPHRINE PF 1 MG/ML IJ SOLN
INTRAMUSCULAR | Status: AC
Start: 2016-08-12 — End: 2016-08-12
  Filled 2016-08-12: qty 1

## 2016-08-12 MED ORDER — PROPOFOL 10 MG/ML IV BOLUS
INTRAVENOUS | Status: AC
  Filled 2016-08-12: qty 20

## 2016-08-12 MED ORDER — ROPIVACAINE HCL 5 MG/ML IJ SOLN
INTRAMUSCULAR | Status: DC | PRN
  Administered 2016-08-12: 30 mL via PERINEURAL

## 2016-08-12 MED ORDER — MENTHOL 3 MG MT LOZG: 1.0000 | LOZENGE | OROMUCOSAL | Status: DC | PRN

## 2016-08-12 MED ORDER — DOCUSATE SODIUM 100 MG PO CAPS
100.0000 mg | ORAL_CAPSULE | Freq: Two times a day (BID) | ORAL | Status: DC
  Administered 2016-08-12 – 2016-08-15 (×6): 100 mg via ORAL
  Filled 2016-08-12 (×7): qty 1

## 2016-08-12 MED ORDER — ALUM & MAG HYDROXIDE-SIMETH 200-200-20 MG/5ML PO SUSP: 30.0000 mL | ORAL | Status: DC | PRN

## 2016-08-12 MED ORDER — ACETAMINOPHEN 650 MG RE SUPP: 650.0000 mg | Freq: Four times a day (QID) | RECTAL | Status: DC | PRN

## 2016-08-12 MED ORDER — DIPHENHYDRAMINE HCL 12.5 MG/5ML PO ELIX: 12.5000 mg | ORAL_SOLUTION | ORAL | Status: DC | PRN

## 2016-08-12 MED ORDER — METOCLOPRAMIDE HCL 5 MG PO TABS: 5.0000 mg | ORAL_TABLET | Freq: Three times a day (TID) | ORAL | Status: DC | PRN

## 2016-08-12 MED ORDER — POTASSIUM CHLORIDE IN NACL 20-0.9 MEQ/L-% IV SOLN
INTRAVENOUS | Status: DC
Start: 2016-08-12 — End: 2016-08-13
  Administered 2016-08-12: 17:00:00 via INTRAVENOUS
  Filled 2016-08-12: qty 1000

## 2016-08-12 MED ORDER — HYDROXYZINE HCL 25 MG PO TABS
25.0000 mg | ORAL_TABLET | Freq: Every day | ORAL | Status: DC | PRN
  Administered 2016-08-15: 25 mg via ORAL
  Filled 2016-08-12: qty 1

## 2016-08-12 MED ORDER — HYDROMORPHONE HCL 1 MG/ML IJ SOLN: 0.2500 mg | INTRAMUSCULAR | Status: DC | PRN

## 2016-08-12 MED ORDER — CEFAZOLIN SODIUM-DEXTROSE 2-4 GM/100ML-% IV SOLN
2.0000 g | Freq: Four times a day (QID) | INTRAVENOUS | Status: AC
  Administered 2016-08-12 (×2): 2 g via INTRAVENOUS
  Filled 2016-08-12 (×2): qty 100

## 2016-08-12 MED ORDER — PHENYLEPHRINE HCL 10 MG/ML IJ SOLN
INTRAVENOUS | Status: DC | PRN
  Administered 2016-08-12: 15 ug/min via INTRAVENOUS

## 2016-08-12 MED ORDER — POLYETHYLENE GLYCOL 3350 17 G PO PACK
17.0000 g | PACK | Freq: Two times a day (BID) | ORAL | Status: DC
  Administered 2016-08-13 – 2016-08-14 (×4): 17 g via ORAL
  Filled 2016-08-12 (×5): qty 1

## 2016-08-12 MED ORDER — METOCLOPRAMIDE HCL 5 MG/ML IJ SOLN: 5.0000 mg | Freq: Three times a day (TID) | INTRAMUSCULAR | Status: DC | PRN

## 2016-08-12 MED ORDER — FENTANYL CITRATE (PF) 100 MCG/2ML IJ SOLN
50.0000 ug | Freq: Once | INTRAMUSCULAR | Status: AC
  Administered 2016-08-12: 50 ug via INTRAVENOUS

## 2016-08-12 MED ORDER — DARIFENACIN HYDROBROMIDE ER 7.5 MG PO TB24
7.5000 mg | ORAL_TABLET | Freq: Every day | ORAL | Status: DC
  Administered 2016-08-12 – 2016-08-15 (×4): 7.5 mg via ORAL
  Filled 2016-08-12 (×4): qty 1

## 2016-08-12 MED ORDER — PHENOL 1.4 % MT LIQD: 1.0000 | OROMUCOSAL | Status: DC | PRN

## 2016-08-12 SURGICAL SUPPLY — 76 items
APL SKNCLS STERI-STRIP NONHPOA (GAUZE/BANDAGES/DRESSINGS) ×1
BANDAGE ESMARK 6X9 LF (GAUZE/BANDAGES/DRESSINGS) ×1 IMPLANT
BENZOIN TINCTURE PRP APPL 2/3 (GAUZE/BANDAGES/DRESSINGS) ×3 IMPLANT
BLADE SAGITTAL 25.0X1.19X90 (BLADE) ×2 IMPLANT
BLADE SAGITTAL 25.0X1.19X90MM (BLADE) ×1
BLADE SAW SGTL 13X75X1.27 (BLADE) ×3 IMPLANT
BLADE SURG 10 STRL SS (BLADE) ×6 IMPLANT
BNDG CMPR 9X6 STRL LF SNTH (GAUZE/BANDAGES/DRESSINGS) ×1
BNDG CMPR MED 15X6 ELC VLCR LF (GAUZE/BANDAGES/DRESSINGS) ×1
BNDG ELASTIC 6X15 VLCR STRL LF (GAUZE/BANDAGES/DRESSINGS) ×3 IMPLANT
BNDG ESMARK 6X9 LF (GAUZE/BANDAGES/DRESSINGS) ×3
BOWL SMART MIX CTS (DISPOSABLE) ×3 IMPLANT
CAP KNEE TOTAL 3 SIGMA ×2 IMPLANT
CEMENT HV SMART SET (Cement) ×6 IMPLANT
CLOSURE WOUND 1/2 X4 (GAUZE/BANDAGES/DRESSINGS) ×1
COVER SURGICAL LIGHT HANDLE (MISCELLANEOUS) ×3 IMPLANT
CUFF TOURNIQUET SINGLE 34IN LL (TOURNIQUET CUFF) ×3 IMPLANT
CUFF TOURNIQUET SINGLE 44IN (TOURNIQUET CUFF) IMPLANT
DECANTER SPIKE VIAL GLASS SM (MISCELLANEOUS) ×1 IMPLANT
DRAPE EXTREMITY T 121X128X90 (DRAPE) ×3 IMPLANT
DRAPE HALF SHEET 40X57 (DRAPES) ×6 IMPLANT
DRAPE INCISE IOBAN 66X45 STRL (DRAPES) IMPLANT
DRAPE U-SHAPE 47X51 STRL (DRAPES) ×3 IMPLANT
DRSG AQUACEL AG ADV 3.5X14 (GAUZE/BANDAGES/DRESSINGS) ×3 IMPLANT
DURAPREP 26ML APPLICATOR (WOUND CARE) ×4 IMPLANT
ELECT CAUTERY BLADE 6.4 (BLADE) ×3 IMPLANT
ELECT REM PT RETURN 9FT ADLT (ELECTROSURGICAL) ×3
ELECTRODE REM PT RTRN 9FT ADLT (ELECTROSURGICAL) ×1 IMPLANT
FACESHIELD WRAPAROUND (MASK) ×6 IMPLANT
FACESHIELD WRAPAROUND OR TEAM (MASK) ×1 IMPLANT
GLOVE BIO SURGEON STRL SZ7 (GLOVE) ×3 IMPLANT
GLOVE BIOGEL PI IND STRL 7.0 (GLOVE) ×1 IMPLANT
GLOVE BIOGEL PI IND STRL 7.5 (GLOVE) ×1 IMPLANT
GLOVE BIOGEL PI INDICATOR 7.0 (GLOVE) ×2
GLOVE BIOGEL PI INDICATOR 7.5 (GLOVE) ×2
GLOVE SS BIOGEL STRL SZ 7.5 (GLOVE) ×1 IMPLANT
GLOVE SUPERSENSE BIOGEL SZ 7.5 (GLOVE) ×2
GOWN STRL REUS W/ TWL LRG LVL3 (GOWN DISPOSABLE) ×1 IMPLANT
GOWN STRL REUS W/ TWL XL LVL3 (GOWN DISPOSABLE) ×2 IMPLANT
GOWN STRL REUS W/TWL LRG LVL3 (GOWN DISPOSABLE) ×3
GOWN STRL REUS W/TWL XL LVL3 (GOWN DISPOSABLE) ×6
HANDPIECE INTERPULSE COAX TIP (DISPOSABLE) ×3
HOOD PEEL AWAY FACE SHEILD DIS (HOOD) ×6 IMPLANT
IMMOBILIZER KNEE 22 UNIV (SOFTGOODS) ×3 IMPLANT
KIT BASIN OR (CUSTOM PROCEDURE TRAY) ×3 IMPLANT
KIT ROOM TURNOVER OR (KITS) ×3 IMPLANT
MANIFOLD NEPTUNE II (INSTRUMENTS) ×3 IMPLANT
MARKER SKIN DUAL TIP RULER LAB (MISCELLANEOUS) ×3 IMPLANT
NDL 18GX1X1/2 (RX/OR ONLY) (NEEDLE) ×1 IMPLANT
NDL FILTER BLUNT 18X1 1/2 (NEEDLE) IMPLANT
NEEDLE 18GX1X1/2 (RX/OR ONLY) (NEEDLE) ×3 IMPLANT
NEEDLE FILTER BLUNT 18X 1/2SAF (NEEDLE) ×2
NEEDLE FILTER BLUNT 18X1 1/2 (NEEDLE) ×1 IMPLANT
NS IRRIG 1000ML POUR BTL (IV SOLUTION) ×3 IMPLANT
PACK TOTAL JOINT (CUSTOM PROCEDURE TRAY) ×3 IMPLANT
PAD ARMBOARD 7.5X6 YLW CONV (MISCELLANEOUS) ×6 IMPLANT
SET HNDPC FAN SPRY TIP SCT (DISPOSABLE) ×1 IMPLANT
STRIP CLOSURE SKIN 1/2X4 (GAUZE/BANDAGES/DRESSINGS) ×2 IMPLANT
SUCTION FRAZIER HANDLE 10FR (MISCELLANEOUS) ×2
SUCTION TUBE FRAZIER 10FR DISP (MISCELLANEOUS) ×1 IMPLANT
SUT MNCRL AB 3-0 PS2 18 (SUTURE) ×3 IMPLANT
SUT VIC AB 0 CT1 27 (SUTURE) ×6
SUT VIC AB 0 CT1 27XBRD ANBCTR (SUTURE) ×2 IMPLANT
SUT VIC AB 1 CT1 27 (SUTURE) ×3
SUT VIC AB 1 CT1 27XBRD ANBCTR (SUTURE) ×1 IMPLANT
SUT VIC AB 2-0 CT1 27 (SUTURE) ×6
SUT VIC AB 2-0 CT1 TAPERPNT 27 (SUTURE) ×2 IMPLANT
SYR 30ML LL (SYRINGE) ×3 IMPLANT
SYR TB 1ML LUER SLIP (SYRINGE) ×2 IMPLANT
TOWEL OR 17X24 6PK STRL BLUE (TOWEL DISPOSABLE) ×3 IMPLANT
TOWEL OR 17X26 10 PK STRL BLUE (TOWEL DISPOSABLE) ×3 IMPLANT
TRAY CATH 16FR W/PLASTIC CATH (SET/KITS/TRAYS/PACK) IMPLANT
TRAY FOLEY CATH SILVER 16FR (SET/KITS/TRAYS/PACK) ×3 IMPLANT
TUBE CONNECTING 12'X1/4 (SUCTIONS) ×1
TUBE CONNECTING 12X1/4 (SUCTIONS) ×2 IMPLANT
YANKAUER SUCT BULB TIP NO VENT (SUCTIONS) ×3 IMPLANT

## 2016-08-12 NOTE — Transfer of Care (Signed)
Immediate Anesthesia Transfer of Care Note  Patient: Shannon Mcintyre  Procedure(s) Performed: Procedure(s): LEFT TOTAL KNEE ARTHROPLASTY (Left)  Patient Location: PACU  Anesthesia Type:Regional and Spinal  Level of Consciousness: awake, alert  and oriented  Airway & Oxygen Therapy: Patient Spontanous Breathing and Patient connected to nasal cannula oxygen  Post-op Assessment: Report given to RN, Post -op Vital signs reviewed and stable and Post -op Vital signs reviewed and unstable, Anesthesiologist notified  Post vital signs: Reviewed and stable  Last Vitals:  Vitals:   08/12/16 1045 08/12/16 1050  BP: (!) 140/57   Pulse: 63 66  Resp: 15 19  Temp:      Last Pain:  Vitals:   08/12/16 0910  TempSrc: Oral         Complications: No apparent anesthesia complications

## 2016-08-12 NOTE — Anesthesia Procedure Notes (Signed)
Spinal  Patient location during procedure: OR Start time: 08/12/2016 11:02 AM End time: 08/12/2016 11:05 AM Staffing Anesthesiologist: Leilani Able Performed: anesthesiologist  Preanesthetic Checklist Completed: patient identified, surgical consent, pre-op evaluation, timeout performed, IV checked, risks and benefits discussed and monitors and equipment checked Spinal Block Patient position: sitting Prep: site prepped and draped and DuraPrep Patient monitoring: heart rate, cardiac monitor, continuous pulse ox and blood pressure Approach: midline Location: L3-4 Injection technique: single-shot Needle Needle type: Pencan  Needle gauge: 24 G Needle length: 9 cm Needle insertion depth: 7 cm Assessment Sensory level: T8

## 2016-08-12 NOTE — Interval H&P Note (Signed)
History and Physical Interval Note:  08/12/2016 9:04 AM  Shannon Mcintyre  has presented today for surgery, with the diagnosis of OA LEFT KNEE  The various methods of treatment have been discussed with the patient and family. After consideration of risks, benefits and other options for treatment, the patient has consented to  Procedure(s): TOTAL KNEE ARTHROPLASTY (Left) as a surgical intervention .  The patient's history has been reviewed, patient examined, no change in status, stable for surgery.  I have reviewed the patient's chart and labs.  Questions were answered to the patient's satisfaction.     Salvatore Marvel A

## 2016-08-12 NOTE — Care Management Note (Signed)
Case Management Note  Patient Details  Name: [name] MRN: [id number] Date of Birth: [birth year]  Subjective/Objective:                 Patient admitted from home with husband. Patient states her bedroom and bathroom are on the first level. She DOES NOT have CPM machine for home use. She has been accepted for [facility] through [facility].    Action/Plan:  CM will continue to follow for DC planning.  Expected Discharge Date:                  Expected Discharge Plan:  Home w Home Health Services  In-House Referral:     Discharge planning Services  CM Consult  Post Acute Care Choice:  Durable Medical Equipment, Home Health Choice offered to:  Patient  DME Arranged:  CPM DME Agency:   ([facility])  HH Arranged:  PT HH Agency:  [facility] (now [facility])  Status of Service:  In process, will continue to follow  If discussed at Long Length of Stay Meetings, dates discussed:    Additional Comments:  Lawerance Sabal, RN 08/12/2016, 4:21 PM

## 2016-08-12 NOTE — Anesthesia Procedure Notes (Addendum)
Anesthesia Regional Block: Adductor canal block   Pre-Anesthetic Checklist: ,, timeout performed, Correct Patient, Correct Site, Correct Laterality, Correct Procedure, Correct Position, site marked, Risks and benefits discussed,  Surgical consent,  Pre-op evaluation,  At surgeon's request and post-op pain management  Laterality: Left and Upper  Prep: chloraprep       Needles:  Injection technique: Single-shot  Needle Type: Echogenic Stimulator Needle     Needle Length: 9cm  Needle Gauge: 21   Needle insertion depth: 5 cm   Additional Needles:   Procedures: ultrasound guided,,,,,,,,  Narrative:  Start time: 08/12/2016 10:30 AM End time: 08/12/2016 10:38 AM Injection made incrementally with aspirations every 5 mL.  Performed by: Personally  Anesthesiologist: Leilani Able

## 2016-08-12 NOTE — Op Note (Signed)
MRN:     [id number] DOB/AGE:    Oct 06, 1932 / 83 y.o.       OPERATIVE REPORT    DATE OF PROCEDURE:  08/12/2016       PREOPERATIVE DIAGNOSIS:   PRIMARY LOCALIZED OA LEFT KNEE      Estimated body mass index is 24.42 kg/m as calculated from the following:   Height as of this encounter: 5\' 5"  (1.651 m).   Weight as of this encounter: 66.6 kg (146 lb 12 oz).                                                        POSTOPERATIVE DIAGNOSIS:   SAME                                                                     PROCEDURE:  Procedure(s): LEFT TOTAL KNEE ARTHROPLASTY Using Depuy Sigma RP implants #3 Femur, #4Tibia, 8mm  RP bearing, 32 Patella     SURGEON: Amalya Salmons A    ASSISTANT:  Kirstin Shepperson PA-C   (Present and scrubbed throughout the case, critical for assistance with exposure, retraction, instrumentation, and closure.)         ANESTHESIA: Spinal with Adductor Nerve Block     TOURNIQUET TIME: 9m   COMPLICATIONS:  None     SPECIMENS: None   INDICATIONS FOR PROCEDURE: The patient has  OA LEFT KNEE, varus deformities, XR shows bone on bone arthritis. Patient has failed all conservative measures including anti-inflammatory medicines, narcotics, attempts at  exercise and weight loss, cortisone injections and viscosupplementation.  Risks and benefits of surgery have been discussed, questions answered.   DESCRIPTION OF PROCEDURE: The patient identified by armband, received  right femoral nerve block and IV antibiotics, in the holding area at [facility]. Patient taken to the operating room, appropriate anesthetic  monitors were attached Spinal anesthesia induced with  the patient in supine position, Foley catheter was inserted. Tourniquet  applied high to the operative thigh. Lateral post and foot positioner  applied to the table, the lower extremity was then prepped and draped  in usual sterile fashion from the ankle to the tourniquet. Time-out procedure was performed.  The limb was wrapped with an Esmarch bandage and the tourniquet inflated to 365 mmHg. We began the operation by making the anterior midline incision starting at handbreadth above the patella going over the patella 1 cm medial to and  4 cm distal to the tibial tubercle. Small bleeders in the skin and the  subcutaneous tissue identified and cauterized. Transverse retinaculum was incised and reflected medially and a medial parapatellar arthrotomy was accomplished. the patella was everted and theprepatellar fat pad resected. The superficial medial collateral  ligament was then elevated from anterior to posterior along the proximal  flare of the tibia and anterior half of the menisci resected. The knee was hyperflexed exposing bone on bone arthritis. Peripheral and notch osteophytes as well as the cruciate ligaments were then resected. We continued to  work our way around posteriorly along the proximal tibia, and externally  rotated the tibia subluxing  it out from underneath the femur. A McHale  retractor was placed through the notch and a lateral Hohmann retractor  placed, and we then drilled through the proximal tibia in line with the  axis of the tibia followed by an intramedullary guide rod and 2-degree  posterior slope cutting guide. The tibial cutting guide was pinned into place  allowing resection of 4 mm of bone medially and about 6 mm of bone  laterally because of her varus deformity. Satisfied with the tibial resection, we then  entered the distal femur 2 mm anterior to the PCL origin with the  intramedullary guide rod and applied the distal femoral cutting guide  set at 24mm, with 5 degrees of valgus. This was pinned along the  epicondylar axis. At this point, the distal femoral cut was accomplished without difficulty. We then sized for a #3 femoral component and pinned the guide in 3 degrees of external rotation.The chamfer cutting guide was pinned into place. The anterior, posterior, and  chamfer cuts were accomplished without difficulty followed by  the  RP box cutting guide and the box cut. We also removed posterior osteophytes from the posterior femoral condyles. At this  time, the knee was brought into full extension. We checked our  extension and flexion gaps and found them symmetric at 47mm.  The patella thickness measured at 25 mm. We set the cutting guide at 15 and removed the posterior 9.5-10 mm  of the patella sized for 32 button and drilled the lollipop. The knee  was then once again hyperflexed exposing the proximal tibia. We sized for a #4 tibial base plate, applied the smokestack and the conical reamer followed by the the Delta fin keel punch. We then hammered into place the  RP trial femoral component, inserted a 1 trial bearing, trial patellar button, and took the knee through range of motion from 0-130 degrees. No thumb pressure was required for patellar  tracking. At this point, all trial components were removed, a double batch of DePuy HV cement  was mixed and applied to all bony metallic mating surfaces except for the posterior condyles of the femur itself. In order, we  hammered into place the tibial tray and removed excess cement, the femoral component and removed excess cement, a 83mm  RP bearing  was inserted, and the knee brought to full extension with compression.  The patellar button was clamped into place, and excess cement  removed. While the cement cured the wound was irrigated out with normal saline solution pulse lavage.. Ligament stability and patellar tracking were checked and found to be excellent.. The parapatellar arthrotomy was closed with  #1 Vicryl suture. The subcutaneous tissue with 0 and 2-0 undyed  Vicryl suture, and 4-0 Monocryl.. A dressing of Aquaseal,  4 x 4, dressing sponges, Webril, and Ace wrap applied. Needle and sponge count were correct times 2.The patient awakened, extubated, and taken to recovery room without difficulty. Vascular  status was normal, pulses 2+ and symmetric.   Yariana Hoaglund A 08/12/2016, 12:40 PM

## 2016-08-12 NOTE — Progress Notes (Signed)
Orthopedic Tech Progress Note Patient Details:  Shannon Mcintyre [birth year] [id number]  CPM Left Knee CPM Left Knee: On Left Knee Flexion (Degrees): 90 Left Knee Extension (Degrees): 0 Additional Comments: foot roll   Saul Fordyce 08/12/2016, 2:31 PM

## 2016-08-12 NOTE — Anesthesia Procedure Notes (Signed)
Date/Time: 08/12/2016 11:00 AM Performed by: Coralee Rud Pre-anesthesia Checklist: Patient identified, Emergency Drugs available, Suction available and Patient being monitored Patient Re-evaluated:Patient Re-evaluated prior to inductionOxygen Delivery Method: Simple face mask Placement Confirmation: positive ETCO2

## 2016-08-12 NOTE — Anesthesia Postprocedure Evaluation (Addendum)
Anesthesia Post Note  Patient: Shannon Mcintyre  Procedure(s) Performed: Procedure(s) (LRB): LEFT TOTAL KNEE ARTHROPLASTY (Left)  Patient location during evaluation: PACU Anesthesia Type: Spinal Level of consciousness: awake Pain management: pain level controlled Vital Signs Assessment: post-procedure vital signs reviewed and stable Respiratory status: spontaneous breathing Cardiovascular status: stable Postop Assessment: no headache, no backache, spinal receding and patient able to bend at knees Anesthetic complications: no        Last Vitals:  Vitals:   08/12/16 1400 08/12/16 1403  BP:  107/80  Pulse: 66 70  Resp: 16 15  Temp:      Last Pain:  Vitals:   08/12/16 1318  TempSrc:   PainSc: 0-No pain   Pain Goal:                 Dionne Rossa JR,JOHN Alejandra Hunt

## 2016-08-12 NOTE — Anesthesia Preprocedure Evaluation (Signed)
Anesthesia Evaluation  Patient identified by MRN, date of birth, ID band  Reviewed: Allergy & Precautions, NPO status , Patient's Chart, lab work & pertinent test results  Airway Mallampati: I       Dental no notable dental hx.    Pulmonary neg pulmonary ROS,    Pulmonary exam normal breath sounds clear to auscultation       Cardiovascular hypertension, Normal cardiovascular exam Rhythm:Regular Rate:Normal     Neuro/Psych negative neurological ROS  negative psych ROS   GI/Hepatic negative GI ROS, Neg liver ROS,   Endo/Other  negative endocrine ROS  Renal/GU negative Renal ROS     Musculoskeletal  (+) Arthritis , Osteoarthritis,    Abdominal Normal abdominal exam  (+)   Peds  Hematology negative hematology ROS (+)   Anesthesia Other Findings   Reproductive/Obstetrics                             Anesthesia Physical Anesthesia Plan  ASA: II  Anesthesia Plan: Spinal   Post-op Pain Management:  Regional for Post-op pain   Induction:   Airway Management Planned: Natural Airway, Nasal Cannula and Simple Face Mask  Additional Equipment:   Intra-op Plan:   Post-operative Plan:   Informed Consent: I have reviewed the patients History and Physical, chart, labs and discussed the procedure including the risks, benefits and alternatives for the proposed anesthesia with the patient or authorized representative who has indicated his/her understanding and acceptance.     Plan Discussed with: CRNA and Surgeon  Anesthesia Plan Comments:         Anesthesia Quick Evaluation

## 2016-08-13 ENCOUNTER — Encounter (HOSPITAL_COMMUNITY): Payer: Self-pay | Admitting: Orthopedic Surgery

## 2016-08-13 ENCOUNTER — Other Ambulatory Visit: Payer: Self-pay | Admitting: Physician Assistant

## 2016-08-13 LAB — CBC
HCT: 32 % — ABNORMAL LOW (ref 36.0–46.0)
Hemoglobin: 10.9 g/dL — ABNORMAL LOW (ref 12.0–15.0)
MCH: 31.8 pg (ref 26.0–34.0)
MCHC: 34.1 g/dL (ref 30.0–36.0)
MCV: 93.3 fL (ref 78.0–100.0)
PLATELETS: 161 10*3/uL (ref 150–400)
RBC: 3.43 MIL/uL — ABNORMAL LOW (ref 3.87–5.11)
RDW: 13.3 % (ref 11.5–15.5)
WBC: 14 10*3/uL — AB (ref 4.0–10.5)

## 2016-08-13 LAB — BASIC METABOLIC PANEL
ANION GAP: 9 (ref 5–15)
BUN: 18 mg/dL (ref 6–20)
CO2: 24 mmol/L (ref 22–32)
Calcium: 9.3 mg/dL (ref 8.9–10.3)
Chloride: 108 mmol/L (ref 101–111)
Creatinine, Ser: 0.95 mg/dL (ref 0.44–1.00)
GFR calc Af Amer: >60 mL/min (ref >60)
GFR, EST NON AFRICAN AMERICAN: 55 mL/min — AB (ref >60)
GLUCOSE: 159 mg/dL — AB (ref 65–99)
POTASSIUM: 4.4 mmol/L (ref 3.5–5.1)
Sodium: 141 mmol/L (ref 135–145)

## 2016-08-13 NOTE — Evaluation (Signed)
Physical Therapy Evaluation Patient Details Name: Shannon Mcintyre MRN: [id number] DOB: 09/04/1932 Today's Date: 08/13/2016   History of Present Illness  Pt admitted for left TKA. PMH includes HTN, arthritis, diverticulosis of colon.  Clinical Impression  Pt is s/p TKA resulting in the deficits listed below (see PT Problem List). *Pt progressing well. Pt will benefit from skilled PT to increase their independence and safety with mobility to allow discharge to the venue listed below.      Follow Up Recommendations Home health PT;Supervision/Assistance - 24 hour    Equipment Recommendations  Rolling walker with 5" wheels;3in1 (PT)    Recommendations for Other Services       Precautions / Restrictions Precautions Precautions: Knee Precaution Booklet Issued: Yes (comment) Precaution Comments: discussed HEP Required Braces or Orthoses: Knee Immobilizer - Left Knee Immobilizer - Left: On at all times;Discontinue once straight leg raise with < 10 degree lag Restrictions Weight Bearing Restrictions: Yes LLE Weight Bearing: Weight bearing as tolerated      Mobility  Bed Mobility Overal bed mobility: Needs Assistance Bed Mobility: Supine to Sit     Supine to sit: Min guard     General bed mobility comments: pt able to manage LEs off EOB without assist  Transfers Overall transfer level: Needs assistance Equipment used: Rolling walker (2 wheeled) Transfers: Sit to/from Stand Sit to Stand: Min assist         General transfer comment: v/c's for hand placement and to steady pt during hand transition  Ambulation/Gait Ambulation/Gait assistance: Min assist Ambulation Distance (Feet): 200 Feet Assistive device: Rolling walker (2 wheeled) Gait Pattern/deviations: Step-through pattern;Decreased stride length Gait velocity: slow Gait velocity interpretation: Below normal speed for age/gender General Gait Details: max directional v/c's for sequencing but then transitioned to  reciprocal/fluid gait pattern without knee buckling. v/c's to look foward not down  Stairs            Wheelchair Mobility    Modified Rankin (Stroke Patients Only)       Balance Overall balance assessment: Needs assistance Sitting-balance support: No upper extremity supported;Feet supported Sitting balance-Leahy Scale: Good Sitting balance - Comments: Good reach to donned underwear   Standing balance support: During functional activity;No upper extremity supported Standing balance-Leahy Scale: Fair Standing balance comment: Good static balance. Needed Min A for dyanmic balance when donning underwear                             Pertinent Vitals/Pain Pain Assessment: 0-10 Pain Score: 5  Pain Location: L knee Pain Descriptors / Indicators: Constant;Discomfort Pain Intervention(s): Monitored during session    Home Living Family/patient expects to be discharged to:: Private residence Living Arrangements: Spouse/significant other Available Help at Discharge: Family;Available 24 hours/day Type of Home: House Home Access: Stairs to enter Entrance Stairs-Rails:  (post) Entrance Stairs-Number of Steps: 2 Home Layout: Two level Home Equipment: Shower seat;Grab bars - tub/shower;Hand held shower head;Bedside commode;Walker - 2 wheels;Adaptive equipment      Prior Function Level of Independence: Independent         Comments: ADLs, IADLs, driving, and likes knitting, sewing,     Hand Dominance   Dominant Hand: Right    Extremity/Trunk Assessment   Upper Extremity Assessment Upper Extremity Assessment: Overall WFL for tasks assessed    Lower Extremity Assessment Lower Extremity Assessment: LLE deficits/detail LLE Deficits / Details: able to initiate quad set and knee flexion to 6o deg in supine  Cervical / Trunk Assessment Cervical / Trunk Assessment: Normal  Communication   Communication: HOH (Pt has her hearing aides with her)  Cognition  Arousal/Alertness: Awake/alert Behavior During Therapy: WFL for tasks assessed/performed Overall Cognitive Status: Impaired/Different from baseline Area of Impairment: Problem solving                             Problem Solving: Slow processing;Decreased initiation;Difficulty sequencing;Requires verbal cues;Requires tactile cues General Comments: pt with difficulty sequencing stepping initially      General Comments General comments (skin integrity, edema, etc.): dressing intact    Exercises Total Joint Exercises Ankle Circles/Pumps: AROM;Both;10 reps Quad Sets: AROM;Right;10 reps Heel Slides: AAROM;Right;10 reps   Assessment/Plan    PT Assessment Patient needs continued PT services  PT Problem List Decreased strength;Decreased range of motion;Decreased activity tolerance;Decreased balance;Decreased mobility;Decreased coordination;Decreased cognition;Decreased knowledge of use of DME;Decreased safety awareness       PT Treatment Interventions DME instruction;Gait training;Stair training;Functional mobility training;Therapeutic activities;Therapeutic exercise;Balance training;Cognitive remediation    PT Goals (Current goals can be found in the Care Plan section)  Acute Rehab PT Goals Patient Stated Goal: Go home PT Goal Formulation: With patient Time For Goal Achievement: 08/20/16 Potential to Achieve Goals: Good    Frequency 7X/week   Barriers to discharge        Co-evaluation               AM-PAC PT "6 Clicks" Daily Activity  Outcome Measure Difficulty turning over in bed (including adjusting bedclothes, sheets and blankets)?: A Little Difficulty moving from lying on back to sitting on the side of the bed? : A Little Difficulty sitting down on and standing up from a chair with arms (e.g., wheelchair, bedside commode, etc,.)?: A Little Help needed moving to and from a bed to chair (including a wheelchair)?: A Little Help needed walking in hospital  room?: A Little Help needed climbing 3-5 steps with a railing? : A Little 6 Click Score: 18    End of Session Equipment Utilized During Treatment: Gait belt Activity Tolerance: Patient tolerated treatment well Patient left: in chair;with call bell/phone within reach Nurse Communication: Mobility status PT Visit Diagnosis: Muscle weakness (generalized) (M62.81);Difficulty in walking, not elsewhere classified (R26.2)    Time: 3710-6269 PT Time Calculation (min) (ACUTE ONLY): 30 min   Charges:   PT Evaluation $PT Eval Moderate Complexity: 1 Procedure PT Treatments $Gait Training: 8-22 mins   PT G Codes:      Lewis Shock, PT, DPT Pager #: [phone] Office #: [phone]   Jonatha Gagen M Kanoelani Dobies 08/13/2016, 10:28 AM

## 2016-08-13 NOTE — Progress Notes (Signed)
Physical Therapy Treatment Patient Details Name: Shannon Mcintyre MRN: [id number] DOB: 1932/11/23 Today's Date: 08/13/2016    History of Present Illness Pt admitted for left TKA. PMH includes HTN, arthritis, diverticulosis of colon.    PT Comments    Pt was seen with husband present. Reviewed remaining supine exercises with pt. Plan to teach remaining seated and standing exercised tomorrow AM as pt is expected to d/c in the PM. Pt ambulated 200 ft with min guard to PT gym. Instructed pt and husband on proper technique to ascend and descend stairs with no rail. Min assist for stairs. Pt is progressing towards all goals. Will continue to follow acutely.    Follow Up Recommendations  Home health PT;Supervision/Assistance - 24 hour     Equipment Recommendations  Rolling walker with 5" wheels;3in1 (PT)    Recommendations for Other Services       Precautions / Restrictions Precautions Precautions: Knee Precaution Booklet Issued: Yes (comment) Precaution Comments: discussed HEP Required Braces or Orthoses: Knee Immobilizer - Left Knee Immobilizer - Left: On at all times;Discontinue once straight leg raise with < 10 degree lag Restrictions Weight Bearing Restrictions: Yes LLE Weight Bearing: Weight bearing as tolerated    Mobility  Bed Mobility Overal bed mobility: Needs Assistance Bed Mobility: Supine to Sit     Supine to sit: Min guard     General bed mobility comments: pt able to manage LEs off EOB without assist  Transfers Overall transfer level: Needs assistance Equipment used: Rolling walker (2 wheeled) Transfers: Sit to/from Stand (x4) Sit to Stand: Min assist         General transfer comment: Practiced sit<>stand on the 3in1 as nurse tech reported pt appeared unsteady with toileting. First sit<>stand appeared unsteady but improved once the 3in1 seat was elvated to highest level.  Ambulation/Gait Ambulation/Gait assistance: Min assist Ambulation Distance  (Feet): 200 Feet Assistive device: Rolling walker (2 wheeled) Gait Pattern/deviations: Step-through pattern;Decreased stride length Gait velocity: slow   General Gait Details: pt with difficulty negociating in room with RW. Frequent cueing required for safe management of RW as pt wants to pick up the walker. Once pt was in the hallway walker management improved.    Stairs Stairs: Yes   Stair Management: No rails;Step to pattern;Backwards;With walker Number of Stairs: 2 General stair comments: pts husband (bob) present for session and instructed on how to assist pt with ascending and descending stairs.  Wheelchair Mobility    Modified Rankin (Stroke Patients Only)       Balance Overall balance assessment: Needs assistance Sitting-balance support: No upper extremity supported;Feet supported Sitting balance-Leahy Scale: Good     Standing balance support: During functional activity;No upper extremity supported Standing balance-Leahy Scale: Fair Standing balance comment: Good static balance. Needed Min A for dyanmic balance when donning underwear                            Cognition Arousal/Alertness: Awake/alert Behavior During Therapy: WFL for tasks assessed/performed                                          Exercises Total Joint Exercises Short Arc Quad: AROM;Left;5 reps;Supine Heel Slides: AROM;Left;5 reps;Supine Hip ABduction/ADduction: AROM;Left;5 reps;Supine Straight Leg Raises: AROM;Left;5 reps;Supine Goniometric ROM: 12-78 knee flexion    General Comments        Pertinent  Vitals/Pain Pain Assessment: Faces Faces Pain Scale: Hurts little more Pain Location: L knee Pain Descriptors / Indicators: Constant;Discomfort Pain Intervention(s): Limited activity within patient's tolerance;Monitored during session;Repositioned    Home Living                      Prior Function            PT Goals (current goals can now be  found in the care plan section) Acute Rehab PT Goals Patient Stated Goal: Go home PT Goal Formulation: With patient Time For Goal Achievement: 08/20/16 Potential to Achieve Goals: Good Progress towards PT goals: Progressing toward goals    Frequency    7X/week      PT Plan Current plan remains appropriate    Co-evaluation              AM-PAC PT "6 Clicks" Daily Activity  Outcome Measure  Difficulty turning over in bed (including adjusting bedclothes, sheets and blankets)?: A Little Difficulty moving from lying on back to sitting on the side of the bed? : A Little Difficulty sitting down on and standing up from a chair with arms (e.g., wheelchair, bedside commode, etc,.)?: A Little Help needed moving to and from a bed to chair (including a wheelchair)?: A Little Help needed walking in hospital room?: A Little Help needed climbing 3-5 steps with a railing? : A Little 6 Click Score: 18    End of Session Equipment Utilized During Treatment: Gait belt Activity Tolerance: Patient tolerated treatment well Patient left: in chair;with call bell/phone within reach;with family/visitor present Nurse Communication: Mobility status PT Visit Diagnosis: Muscle weakness (generalized) (M62.81);Difficulty in walking, not elsewhere classified (R26.2)     Time: 8469-6295 PT Time Calculation (min) (ACUTE ONLY): 40 min  Charges:  $Gait Training: 8-22 mins $Therapeutic Exercise: 8-22 mins $Therapeutic Activity: 8-22 mins                    G Codes:       Kallie Locks, PTA Acute Rehab [phone]   Sheral Apley 08/13/2016, 3:41 PM

## 2016-08-13 NOTE — Plan of Care (Signed)
Problem: Education: Goal: Knowledge of [place] [facility] Education information/materials will improve Outcome: Progressing POC reviewed with pt.   

## 2016-08-13 NOTE — Evaluation (Signed)
Occupational Therapy Evaluation Patient Details Name: Shannon Mcintyre MRN: [id number] DOB: 04/09/1932 Today's Date: 08/13/2016    History of Present Illness Pt admitted for left TKA. PMH includes HTN, arthritis, diverticulosis of colon.   Clinical Impression   PTA, pt was independent and living with her husband. Currently, pt is performing ADLs with Min guard-Min A to increase safety in standing. Pt demonstrates good activity tolerance and ROM for LB ADLs. Pt would benefit from continued acute OT to increase safety and independence with ADLs and functional mobility. Recommend dc home once medically stable per physician.     Follow Up Recommendations  No OT follow up;Supervision/Assistance - 24 hour    Equipment Recommendations  None recommended by OT    Recommendations for Other Services       Precautions / Restrictions Precautions Precautions: Knee Precaution Comments: Reviewed resting in extended positioning Required Braces or Orthoses: Knee Immobilizer - Left Knee Immobilizer - Left: On at all times Restrictions Weight Bearing Restrictions: Yes LLE Weight Bearing: Weight bearing as tolerated      Mobility Bed Mobility               General bed mobility comments: In recliner upon arrival  Transfers Overall transfer level: Needs assistance Equipment used: Rolling walker (2 wheeled) Transfers: Sit to/from Stand Sit to Stand: Min assist         General transfer comment: Min A for safety    Balance Overall balance assessment: Needs assistance Sitting-balance support: No upper extremity supported;Feet supported Sitting balance-Leahy Scale: Good Sitting balance - Comments: Good reach to donned underwear   Standing balance support: During functional activity;No upper extremity supported Standing balance-Leahy Scale: Fair Standing balance comment: Good static balance. Needed Min A for dyanmic balance when donning underwear                            ADL either performed or assessed with clinical judgement   ADL Overall ADL's : Needs assistance/impaired Eating/Feeding: Set up;Sitting   Grooming: Oral care;Brushing hair;Wash/dry face;Wash/dry hands;Min guard;Standing   Upper Body Bathing: Min guard;Sitting   Lower Body Bathing: Sit to/from stand;Minimal assistance Lower Body Bathing Details (indicate cue type and reason): Min A for to steady in standing Upper Body Dressing : Set up;Sitting   Lower Body Dressing: Minimal assistance;Sit to/from stand Lower Body Dressing Details (indicate cue type and reason): Pt donned underwear and required Min A to steady in standing. Had difficulty maintaining balacne with dynamic movements Toilet Transfer: Min guard;RW;Ambulation (Simulated to recliner) Toilet Transfer Details (indicate cue type and reason): Pt demosntrates good technique. benefits from VCs for hand placement         Functional mobility during ADLs: Min guard;Rolling walker General ADL Comments: Pt performign ADLs well. Needs education on shower transfer with shower seat     Vision Baseline Vision/History: Wears glasses Wears Glasses: At all times Patient Visual Report: No change from baseline       Perception     Praxis      Pertinent Vitals/Pain Pain Assessment: 0-10 Pain Score: 5  Pain Location: L knee Pain Descriptors / Indicators: Constant;Discomfort Pain Intervention(s): Monitored during session     Hand Dominance Right   Extremity/Trunk Assessment Upper Extremity Assessment Upper Extremity Assessment: Overall WFL for tasks assessed   Lower Extremity Assessment Lower Extremity Assessment: Defer to PT evaluation       Communication Communication Communication: HOH (Pt has her hearing aides with her)  Cognition Arousal/Alertness: Awake/alert Behavior During Therapy: WFL for tasks assessed/performed Overall Cognitive Status: No family/caregiver present to determine baseline cognitive  functioning                                 General Comments: Pt with some difficulty following verbal commands. Benefits from visual and tactile cues   General Comments       Exercises     Shoulder Instructions      Home Living Family/patient expects to be discharged to:: Private residence Living Arrangements: Spouse/significant other Available Help at Discharge: Family;Available 24 hours/day Type of Home: House       Home Layout: Two level Alternate Level Stairs-Number of Steps: 13   Bathroom Shower/Tub: Producer, television/film/video: Handicapped height Bathroom Accessibility: Yes   Home Equipment: Shower seat;Grab bars - tub/shower;Hand held shower head;Bedside commode;Walker - 2 wheels;Adaptive equipment Adaptive Equipment: Reacher        Prior Functioning/Environment Level of Independence: Independent        Comments: ADLs, IADLs, driving, and likes knitting, sewing,        OT Problem List: Decreased activity tolerance;Impaired balance (sitting and/or standing);Decreased safety awareness;Decreased knowledge of use of DME or AE;Decreased knowledge of precautions;Pain      OT Treatment/Interventions: Self-care/ADL training;Therapeutic exercise;Energy conservation;DME and/or AE instruction;Therapeutic activities;Patient/family education    OT Goals(Current goals can be found in the care plan section) Acute Rehab OT Goals Patient Stated Goal: Go home OT Goal Formulation: With patient Time For Goal Achievement: 08/27/16 Potential to Achieve Goals: Good ADL Goals Pt Will Perform Lower Body Bathing: sit to/from stand;with supervision;with set-up Pt Will Perform Lower Body Dressing: with set-up;with supervision;sit to/from stand Pt Will Transfer to Toilet: with min guard assist;ambulating;regular height toilet;grab bars Pt Will Perform Toileting - Clothing Manipulation and hygiene: with min guard assist;sit to/from stand Pt Will Perform  Tub/Shower Transfer: with min guard assist;Shower transfer;shower seat  OT Frequency: Min 2X/week   Barriers to D/C:            Co-evaluation              AM-PAC PT "6 Clicks" Daily Activity     Outcome Measure Help from another person eating meals?: None Help from another person taking care of personal grooming?: A Little Help from another person toileting, which includes using toliet, bedpan, or urinal?: A Little Help from another person bathing (including washing, rinsing, drying)?: A Lot Help from another person to put on and taking off regular upper body clothing?: None Help from another person to put on and taking off regular lower body clothing?: A Little 6 Click Score: 19   End of Session Equipment Utilized During Treatment: Gait belt;Rolling walker;Left knee immobilizer Nurse Communication: Mobility status  Activity Tolerance: Patient tolerated treatment well Patient left: in chair;with call bell/phone within reach  OT Visit Diagnosis: Unsteadiness on feet (R26.81);Muscle weakness (generalized) (M62.81);Pain Pain - Right/Left: Left Pain - part of body: Knee                Time: 4462-8638 OT Time Calculation (min): 26 min Charges:  OT General Charges $OT Visit: 1 Procedure OT Evaluation $OT Eval Low Complexity: 1 Procedure OT Treatments $Self Care/Home Management : 8-22 mins G-Codes:     [employer], OTR/L [phone]  Theodoro Grist Avraham Benish 08/13/2016, 8:45 AM

## 2016-08-13 NOTE — Progress Notes (Signed)
Subjective: 1 Day Post-Op Procedure(s) (LRB): LEFT TOTAL KNEE ARTHROPLASTY (Left) Patient reports pain as 4 on 0-10 scale.    Objective: Vital signs in last 24 hours: Temp:  [96.8 F (36 C)-97.9 F (36.6 C)] 97.7 F (36.5 C) (05/15 0420) Pulse Rate:  [58-94] 88 (05/15 0420) Resp:  [10-30] 18 (05/15 0420) BP: (104-161)/(54-80) 123/74 (05/15 0420) SpO2:  [93 %-100 %] 93 % (05/15 0420) Weight:  [66.6 kg (146 lb 12 oz)] 66.6 kg (146 lb 12 oz) (05/14 0910)  Intake/Output from previous day: 05/14 0701 - 05/15 0700 In: 700 [I.V.:600; IV Piggyback:100] Out: 1100 [Urine:1100] Intake/Output this shift: No intake/output data recorded.   Recent Labs  08/13/16 0505  HGB 10.9*    Recent Labs  08/13/16 0505  WBC 14.0*  RBC 3.43*  HCT 32.0*  PLT 161    Recent Labs  08/13/16 0505  NA 141  K 4.4  CL 108  CO2 24  BUN 18  CREATININE 0.95  GLUCOSE 159*  CALCIUM 9.3   No results for input(s): LABPT, INR in the last 72 hours.  ABD soft Neurovascular intact Sensation intact distally Intact pulses distally Dorsiflexion/Plantar flexion intact Incision: dressing C/D/I and scant drainage  Assessment/Plan: 1 Day Post-Op Procedure(s) (LRB): LEFT TOTAL KNEE ARTHROPLASTY (Left)  Principal Problem:   Primary localized osteoarthritis of left knee Active Problems:   Essential hypertension   Eczema   Hyperlipidemia  Advance diet Up with therapy D/C IV fluids Plan for discharge tomorrow Discharge home with home health  Pascal Lux 08/13/2016, 8:27 AM

## 2016-08-13 NOTE — Progress Notes (Signed)
Upon AM assessment, RN noticed that there was increased swelling and redness around patient's IV sit in her right upper arm. IV appeared to have infiltrated. RN removed IV and assisted patient in elevating right upper extremity. RN inserted a new IV in patient's left arm. Patient voices no complaints of pain or tenderness.

## 2016-08-14 LAB — CBC
HCT: 29 % — ABNORMAL LOW (ref 36.0–46.0)
Hemoglobin: 9.6 g/dL — ABNORMAL LOW (ref 12.0–15.0)
MCH: 31.4 pg (ref 26.0–34.0)
MCHC: 33.1 g/dL (ref 30.0–36.0)
MCV: 94.8 fL (ref 78.0–100.0)
PLATELETS: 156 10*3/uL (ref 150–400)
RBC: 3.06 MIL/uL — ABNORMAL LOW (ref 3.87–5.11)
RDW: 13.8 % (ref 11.5–15.5)
WBC: 16 10*3/uL — ABNORMAL HIGH (ref 4.0–10.5)

## 2016-08-14 LAB — BASIC METABOLIC PANEL
Anion gap: 7 (ref 5–15)
BUN: 23 mg/dL — AB (ref 6–20)
CHLORIDE: 109 mmol/L (ref 101–111)
CO2: 26 mmol/L (ref 22–32)
CREATININE: 0.88 mg/dL (ref 0.44–1.00)
Calcium: 9.1 mg/dL (ref 8.9–10.3)
GFR calc Af Amer: >60 mL/min (ref >60)
GFR, EST NON AFRICAN AMERICAN: 60 mL/min — AB (ref >60)
GLUCOSE: 126 mg/dL — AB (ref 65–99)
Potassium: 4.5 mmol/L (ref 3.5–5.1)
SODIUM: 142 mmol/L (ref 135–145)

## 2016-08-14 MED ORDER — CEPHALEXIN 500 MG PO CAPS
500.0000 mg | ORAL_CAPSULE | Freq: Three times a day (TID) | ORAL | Status: DC
  Administered 2016-08-15: 500 mg via ORAL
  Filled 2016-08-14: qty 1

## 2016-08-14 MED ORDER — CEPHALEXIN 500 MG PO CAPS
500.0000 mg | ORAL_CAPSULE | Freq: Four times a day (QID) | ORAL | Status: DC
Start: 2016-08-14 — End: 2016-08-14
  Administered 2016-08-14 (×3): 500 mg via ORAL
  Filled 2016-08-14 (×2): qty 1

## 2016-08-14 NOTE — Progress Notes (Signed)
Occupational Therapy Treatment Patient Details Name: Shannon Mcintyre MRN: [id number] DOB: 1932-05-11 Today's Date: 08/14/2016    History of present illness Pt admitted for left TKA. PMH includes HTN, arthritis, diverticulosis of colon.   OT comments  Provided education on walk-in shower transfer. Pt performed transfer with Min A for safety and Mod-Max verbal, visual, and tactile cues to sequence task. Provided pt with written instructions to increase carry over at home and review with husband. Continue to recommend dc home with 24/7 supervirsion to increase safety. Will continue to follow to facilitate safe dc home and increase independence with ADLs and functional mobility.   Follow Up Recommendations  No OT follow up;Supervision/Assistance - 24 hour    Equipment Recommendations  None recommended by OT    Recommendations for Other Services      Precautions / Restrictions Precautions Precautions: Knee Precaution Booklet Issued: Yes (comment) Precaution Comments: discussed HEP Required Braces or Orthoses: Knee Immobilizer - Left Knee Immobilizer - Left: On at all times;Discontinue once straight leg raise with < 10 degree lag Restrictions Weight Bearing Restrictions: Yes LLE Weight Bearing: Weight bearing as tolerated       Mobility Bed Mobility               General bed mobility comments: in chair on arrival  Transfers Overall transfer level: Needs assistance Equipment used: Rolling walker (2 wheeled) Transfers: Sit to/from Stand Sit to Stand: Min assist         General transfer comment: min assist to come fully upright and to steady once in standing.    Balance Overall balance assessment: Needs assistance Sitting-balance support: No upper extremity supported;Feet supported Sitting balance-Leahy Scale: Good Sitting balance - Comments: Good reach to donned underwear   Standing balance support: During functional activity;No upper extremity  supported Standing balance-Leahy Scale: Fair Standing balance comment: Good static balance. Needed Min A for dyanmic balance when donning underwear                           ADL either performed or assessed with clinical judgement   ADL Overall ADL's : Needs assistance/impaired                         Toilet Transfer:  (Simulated to recliner)       Tub/ Shower Transfer: Walk-in shower;Minimal assistance;Cueing for safety;Cueing for sequencing;Ambulation;Shower Field seismologist Details (indicate cue type and reason): Pt educated in shower transfer and required Mod-Max verbal, visual, and tactile cues to sequence task. Provided pt with a writting instruction of task to provide to husband. Advised pt to have husband during showering Functional mobility during ADLs: Min guard;Rolling walker General ADL Comments: Focused on shower transfer     Vision       Perception     Praxis      Cognition Arousal/Alertness: Awake/alert Behavior During Therapy: WFL for tasks assessed/performed Overall Cognitive Status: Within Functional Limits for tasks assessed Area of Impairment: Problem solving                             Problem Solving: Slow processing;Decreased initiation;Difficulty sequencing;Requires verbal cues;Requires tactile cues General Comments: pt with difficulty sequencing stepping initially        Exercises Total Joint Exercises Heel Slides:  (towel under foot) Knee Flexion:  (10 second holds)   Shoulder Instructions  General Comments Pt continues to make comments that do not fit situation or conversation.  Feel pt should have 24 hour supervision upon dc for safety    Pertinent Vitals/ Pain       Pain Assessment: Faces Faces Pain Scale: Hurts a little bit Pain Location: L knee Pain Descriptors / Indicators: Constant;Discomfort Pain Intervention(s): Monitored during session;Repositioned  Home Living                                           Prior Functioning/Environment              Frequency  Min 2X/week        Progress Toward Goals  OT Goals(current goals can now be found in the care plan section)  Progress towards OT goals: Progressing toward goals  Acute Rehab OT Goals Patient Stated Goal: Go home OT Goal Formulation: With patient Time For Goal Achievement: 08/27/16 Potential to Achieve Goals: Good ADL Goals Pt Will Perform Lower Body Bathing: sit to/from stand;with supervision;with set-up Pt Will Perform Lower Body Dressing: with set-up;with supervision;sit to/from stand Pt Will Transfer to Toilet: with min guard assist;ambulating;regular height toilet;grab bars Pt Will Perform Toileting - Clothing Manipulation and hygiene: with min guard assist;sit to/from stand Pt Will Perform Tub/Shower Transfer: with min guard assist;Shower transfer;shower seat  Plan Discharge plan remains appropriate    Co-evaluation                 AM-PAC PT "6 Clicks" Daily Activity     Outcome Measure   Help from another person eating meals?: None Help from another person taking care of personal grooming?: A Little Help from another person toileting, which includes using toliet, bedpan, or urinal?: A Little Help from another person bathing (including washing, rinsing, drying)?: A Lot Help from another person to put on and taking off regular upper body clothing?: None Help from another person to put on and taking off regular lower body clothing?: A Little 6 Click Score: 19    End of Session Equipment Utilized During Treatment: Gait belt;Rolling walker CPM Left Knee CPM Left Knee: Off  OT Visit Diagnosis: Unsteadiness on feet (R26.81);Muscle weakness (generalized) (M62.81);Pain Pain - Right/Left: Left Pain - part of body: Knee   Activity Tolerance Patient tolerated treatment well   Patient Left in chair;with call bell/phone within reach   Nurse Communication Mobility  status        Time: 1207-1224 OT Time Calculation (min): 17 min  Charges: OT General Charges $OT Visit: 1 Procedure OT Treatments $Self Care/Home Management : 8-22 mins  Charlottie Peragine, OTR/L [phone]   Theodoro Grist Cathyann Kilfoyle 08/14/2016, 3:35 PM

## 2016-08-14 NOTE — Progress Notes (Signed)
Physical Therapy Treatment Patient Details Name: Shannon Mcintyre MRN: [id number] DOB: [birth year] Today's Date: 08/14/2016    History of Present Illness Pt admitted for left TKA. PMH includes HTN, arthritis, diverticulosis of colon.    PT Comments    Pt continues to show progress, as she is able to ambulate farther each treatment and requires less cueing for technique. She reported performing her HEP 1x since prior treatment and states the exercises were hard but doable. She had no questions regarding HEP. Pt expected to d/c tomorrow. Will continue to follow acutely.   Follow Up Recommendations  Home health PT;Supervision/Assistance - 24 hour     Equipment Recommendations  Rolling walker with 5" wheels;3in1 (PT)    Recommendations for Other Services       Precautions / Restrictions Precautions Precautions: Knee Precaution Booklet Issued: Yes (comment) Precaution Comments: discussed HEP Required Braces or Orthoses: Knee Immobilizer - Left Knee Immobilizer - Left: On at all times;Discontinue once straight leg raise with < 10 degree lag Restrictions Weight Bearing Restrictions: Yes LLE Weight Bearing: Weight bearing as tolerated    Mobility  Bed Mobility               General bed mobility comments: in chair on arrival  Transfers Overall transfer level: Needs assistance Equipment used: Rolling walker (2 wheeled) Transfers: Sit to/from Stand Sit to Stand: Min assist         General transfer comment: min assist to come fully upright and to steady once in standing.  Ambulation/Gait Ambulation/Gait assistance: Min guard Ambulation Distance (Feet): 370 Feet Assistive device: Rolling walker (2 wheeled) Gait Pattern/deviations: Step-through pattern;Decreased stride length Gait velocity: slow   General Gait Details: pt steady with gait overall. Requires min cues for walker management and proxcimity.   Stairs            Wheelchair Mobility    Modified  Rankin (Stroke Patients Only)       Balance Overall balance assessment: Needs assistance Sitting-balance support: No upper extremity supported;Feet supported Sitting balance-Leahy Scale: Good     Standing balance support: During functional activity;No upper extremity supported Standing balance-Leahy Scale: Fair                              Cognition Arousal/Alertness: Awake/alert Behavior During Therapy: WFL for tasks assessed/performed Overall Cognitive Status: Within Functional Limits for tasks assessed                                        Exercises      General Comments        Pertinent Vitals/Pain Pain Assessment: Faces Faces Pain Scale: Hurts little more Pain Location: L knee Pain Descriptors / Indicators: Constant;Discomfort Pain Intervention(s): Monitored during session;Limited activity within patient's tolerance;Repositioned    Home Living                      Prior Function            PT Goals (current goals can now be found in the care plan section) Acute Rehab PT Goals Patient Stated Goal: Go home PT Goal Formulation: With patient Time For Goal Achievement: 08/20/16 Potential to Achieve Goals: Good Progress towards PT goals: Progressing toward goals    Frequency    7X/week      PT Plan Current plan  remains appropriate    Co-evaluation              AM-PAC PT "6 Clicks" Daily Activity  Outcome Measure  Difficulty turning over in bed (including adjusting bedclothes, sheets and blankets)?: A Little Difficulty moving from lying on back to sitting on the side of the bed? : A Little Difficulty sitting down on and standing up from a chair with arms (e.g., wheelchair, bedside commode, etc,.)?: A Little Help needed moving to and from a bed to chair (including a wheelchair)?: A Little Help needed walking in hospital room?: A Little Help needed climbing 3-5 steps with a railing? : A Little 6 Click Score:  18    End of Session Equipment Utilized During Treatment: Gait belt;Left knee immobilizer Activity Tolerance: Patient tolerated treatment well Patient left: in chair;with call bell/phone within reach;with family/visitor present   PT Visit Diagnosis: Muscle weakness (generalized) (M62.81);Difficulty in walking, not elsewhere classified (R26.2)     Time: 6789-3810 PT Time Calculation (min) (ACUTE ONLY): 22 min  Charges:  $Gait Training: 8-22 mins                    G Codes:       Kallie Locks, PTA Acute Rehab [phone]  Sheral Apley 08/14/2016, 3:19 PM

## 2016-08-14 NOTE — Progress Notes (Signed)
Subjective: 2 Days Post-Op Procedure(s) (LRB): LEFT TOTAL KNEE ARTHROPLASTY (Left) Patient reports pain as 4 on 0-10 scale.    Objective: Vital signs in last 24 hours: Temp:  [98.3 F (36.8 C)-98.6 F (37 C)] 98.3 F (36.8 C) (05/16 0459) Pulse Rate:  [71-73] 73 (05/16 0459) Resp:  [18] 18 (05/16 0459) BP: (121-158)/(47-72) 158/61 (05/16 0459) SpO2:  [95 %-96 %] 96 % (05/16 0459)  Intake/Output from previous day: 05/15 0701 - 05/16 0700 In: 720 [P.O.:720] Out: -  Intake/Output this shift: No intake/output data recorded.   Recent Labs  08/13/16 0505  HGB 10.9*    Recent Labs  08/13/16 0505  WBC 14.0*  RBC 3.43*  HCT 32.0*  PLT 161    Recent Labs  08/13/16 0505  NA 141  K 4.4  CL 108  CO2 24  BUN 18  CREATININE 0.95  GLUCOSE 159*  CALCIUM 9.3   No results for input(s): LABPT, INR in the last 72 hours.  ABD soft Neurovascular intact Sensation intact distally Intact pulses distally Dorsiflexion/Plantar flexion intact Incision: dressing C/D/I right arm bruise with early cellulitis  Assessment/Plan: 2 Days Post-Op Procedure(s) (LRB): LEFT TOTAL KNEE ARTHROPLASTY (Left)  Principal Problem:   Primary localized osteoarthritis of left knee Active Problems:   Essential hypertension   Eczema   Hyperlipidemia cellulitis right upper arm due to IV infiltration Up with therapy Discharge home with home health Will watch how she progresses today.  Some memory issues this am.   Will start Keflex 500mg  QID for cellulitis on upper right arm due to IV infiltrations.  Probably home tomorrow.  Will decide on fluid bolus after lab results are back Nathian Stencil J 08/14/2016, 7:24 AM

## 2016-08-14 NOTE — Progress Notes (Signed)
Orthopedic Tech Progress Note Patient Details:  Shannon Mcintyre 05-Mar-1933 [id number]  Ortho Devices Ortho Device/Splint Location: on cpm at 1930   Jennye Moccasin 08/14/2016, 7:29 PM

## 2016-08-14 NOTE — Progress Notes (Signed)
Physical Therapy Treatment Patient Details Name: Shannon Mcintyre MRN: [id number] DOB: 04-20-1932 Today's Date: 08/14/2016    History of Present Illness Pt admitted for left TKA. PMH includes HTN, arthritis, diverticulosis of colon.    PT Comments    Performed remaining HEP and ambulated in hallway with pt for 270 ft min guard. Pt improving with walker management and requires less cueing during transfers and ambulation. Assisted pt with toileting. Pt is progressing well towards goals and should benefit from continued PT to achieve unmet goals. Will continue to follow acutely.    Follow Up Recommendations  Home health PT;Supervision/Assistance - 24 hour     Equipment Recommendations  Rolling walker with 5" wheels;3in1 (PT)    Recommendations for Other Services       Precautions / Restrictions Precautions Precautions: Knee Precaution Booklet Issued: Yes (comment) Precaution Comments: discussed HEP Required Braces or Orthoses: Knee Immobilizer - Left Knee Immobilizer - Left: On at all times;Discontinue once straight leg raise with < 10 degree lag Restrictions Weight Bearing Restrictions: Yes LLE Weight Bearing: Weight bearing as tolerated    Mobility  Bed Mobility               General bed mobility comments: in bed on arrival  Transfers Overall transfer level: Needs assistance Equipment used: Rolling walker (2 wheeled) Transfers: Sit to/from Stand (x4) Sit to Stand: Min assist         General transfer comment: min assist to come fully upright and to steady once in standing.  Ambulation/Gait Ambulation/Gait assistance: Min guard Ambulation Distance (Feet): 270 Feet Assistive device: Rolling walker (2 wheeled) Gait Pattern/deviations: Step-through pattern;Decreased stride length Gait velocity: slow Gait velocity interpretation: Below normal speed for age/gender General Gait Details: pt with difficulty negociating in room with RW. Frequent cueing required for  safe management of RW as pt wants to pick up the walker. Once pt was in the hallway walker management improved.    Stairs            Wheelchair Mobility    Modified Rankin (Stroke Patients Only)       Balance Overall balance assessment: Needs assistance Sitting-balance support: No upper extremity supported;Feet supported Sitting balance-Leahy Scale: Good Sitting balance - Comments: Good reach to donned underwear   Standing balance support: During functional activity;No upper extremity supported Standing balance-Leahy Scale: Fair Standing balance comment: Good static balance. Needed Min A for dyanmic balance when donning underwear                            Cognition Arousal/Alertness: Awake/alert Behavior During Therapy: [facility] for tasks assessed/performed                                          Exercises Total Joint Exercises Heel Slides: AROM;Left;10 reps;Seated (towel under foot) Long Arc Quad: AROM;Left;10 reps;Seated Knee Flexion: AROM;Left;10 reps;Seated (10 second holds)    General Comments        Pertinent Vitals/Pain Pain Assessment: 0-10 Pain Score: 4  Pain Location: L knee Pain Descriptors / Indicators: Constant;Discomfort Pain Intervention(s): Monitored during session;Limited activity within patient's tolerance    Home Living                      Prior Function            PT Goals (  current goals can now be found in the care plan section) Acute Rehab PT Goals Patient Stated Goal: Go home PT Goal Formulation: With patient Time For Goal Achievement: 08/20/16 Potential to Achieve Goals: Good Progress towards PT goals: Progressing toward goals    Frequency    7X/week      PT Plan Current plan remains appropriate    Co-evaluation              AM-PAC PT "6 Clicks" Daily Activity  Outcome Measure  Difficulty turning over in bed (including adjusting bedclothes, sheets and blankets)?: A  Little Difficulty moving from lying on back to sitting on the side of the bed? : A Little Difficulty sitting down on and standing up from a chair with arms (e.g., wheelchair, bedside commode, etc,.)?: A Little Help needed moving to and from a bed to chair (including a wheelchair)?: A Little Help needed walking in hospital room?: A Little Help needed climbing 3-5 steps with a railing? : A Little 6 Click Score: 18    End of Session Equipment Utilized During Treatment: Gait belt Activity Tolerance: Patient tolerated treatment well Patient left: in chair;with call bell/phone within reach;with family/visitor present Nurse Communication: Mobility status PT Visit Diagnosis: Muscle weakness (generalized) (M62.81);Difficulty in walking, not elsewhere classified (R26.2)     Time: 3662-9476 PT Time Calculation (min) (ACUTE ONLY): 36 min  Charges:  $Gait Training: 8-22 mins $Therapeutic Exercise: 8-22 mins                    G Codes:       Kallie Locks, PTA Acute Rehab [phone]   Sheral Apley 08/14/2016, 12:01 PM

## 2016-08-15 LAB — CBC
HCT: 32.5 % — ABNORMAL LOW (ref 36.0–46.0)
Hemoglobin: 10.9 g/dL — ABNORMAL LOW (ref 12.0–15.0)
MCH: 32.3 pg (ref 26.0–34.0)
MCHC: 33.5 g/dL (ref 30.0–36.0)
MCV: 96.4 fL (ref 78.0–100.0)
PLATELETS: 180 10*3/uL (ref 150–400)
RBC: 3.37 MIL/uL — ABNORMAL LOW (ref 3.87–5.11)
RDW: 14.3 % (ref 11.5–15.5)
WBC: 13.9 10*3/uL — ABNORMAL HIGH (ref 4.0–10.5)

## 2016-08-15 LAB — BASIC METABOLIC PANEL
Anion gap: 11 (ref 5–15)
BUN: 27 mg/dL — ABNORMAL HIGH (ref 6–20)
CO2: 25 mmol/L (ref 22–32)
CREATININE: 0.94 mg/dL (ref 0.44–1.00)
Calcium: 9.2 mg/dL (ref 8.9–10.3)
Chloride: 105 mmol/L (ref 101–111)
GFR calc Af Amer: >60 mL/min (ref >60)
GFR, EST NON AFRICAN AMERICAN: 55 mL/min — AB (ref >60)
GLUCOSE: 99 mg/dL (ref 65–99)
POTASSIUM: 3.9 mmol/L (ref 3.5–5.1)
SODIUM: 141 mmol/L (ref 135–145)

## 2016-08-15 MED ORDER — ASPIRIN 325 MG PO TBEC: DELAYED_RELEASE_TABLET | ORAL | 0 refills | Status: DC

## 2016-08-15 MED ORDER — DOCUSATE SODIUM 100 MG PO CAPS: ORAL_CAPSULE | ORAL | 0 refills | Status: DC

## 2016-08-15 MED ORDER — CEPHALEXIN 500 MG PO CAPS: 500.0000 mg | ORAL_CAPSULE | Freq: Four times a day (QID) | ORAL | 0 refills | Status: DC

## 2016-08-15 MED ORDER — OXYCODONE HCL 5 MG PO TABS: 5.0000 mg | ORAL_TABLET | ORAL | 0 refills | Status: DC | PRN

## 2016-08-15 MED ORDER — HYDROXYZINE HCL 25 MG PO TABS: 25.0000 mg | ORAL_TABLET | ORAL | 0 refills | Status: DC | PRN

## 2016-08-15 NOTE — Care Management Important Message (Signed)
Important Message  Patient Details  Name: Shannon Mcintyre MRN: [id number] Date of Birth: 1933/02/23   Medicare Important Message Given:  Yes    [name] 08/15/2016, 12:11 PM

## 2016-08-15 NOTE — Progress Notes (Signed)
Patient found up with walker in room. Had pulled out her IV, removed surgical dressing, gown, ID Bands, SCDs and placed all in the garbage can with the SCD machine on top.  Pt stated "it was all such a mess I had to clean it up".  She asked this RN what part of her house she was in as it seemed unfamiliar to her.  Upon reorientation, pt remembered she was in the hospital, but unable to remember the name of it.  Was able to name the day, date, time, and president.  Assisted with toileting, placed telfa to surgical site, and assisted to recliner with placement of chair alarm intact and functioning with call light in reach.

## 2016-08-15 NOTE — Progress Notes (Addendum)
Occupational Therapy Treatment Patient Details Name: Shannon Mcintyre MRN: [id number] DOB: [birth year] Today's Date: 08/15/2016    History of present illness Pt admitted for left TKA. PMH includes HTN, arthritis, diverticulosis of colon.   OT comments  Pt progressing towards acute OT goals. Focus os session was toilet transfer and pericare. Session details below. Granddaughter present and reports multiple people assisting at home.   Follow Up Recommendations  No OT follow up;Supervision/Assistance - 24 hour    Equipment Recommendations  3 in 1 bedside commode    Recommendations for Other Services      Precautions / Restrictions Precautions Precautions: Knee Precaution Booklet Issued: Yes (comment) Precaution Comments: reviewed Required Braces or Orthoses: Knee Immobilizer - Left Knee Immobilizer - Left: On at all times;Discontinue once straight leg raise with < 10 degree lag Restrictions Weight Bearing Restrictions: Yes LLE Weight Bearing: Weight bearing as tolerated       Mobility Bed Mobility               General bed mobility comments: in chair on arrival  Transfers Overall transfer level: Needs assistance Equipment used: Rolling walker (2 wheeled) Transfers: Sit to/from Stand Sit to Stand: Min assist         General transfer comment: mostly min guard, some min A to steady and control descent.    Balance Overall balance assessment: Needs assistance Sitting-balance support: No upper extremity supported;Feet supported Sitting balance-Leahy Scale: Good     Standing balance support: During functional activity;Bilateral upper extremity supported Standing balance-Leahy Scale: Fair Standing balance comment: Needed Min guard A for balance as she was having difficulty getting to standing due to processing and she needed UE support and cues.                            ADL either performed or assessed with clinical judgement   ADL Overall ADL's :  Needs assistance/impaired                         Toilet Transfer: Min guard;RW;Ambulation;Minimal Dentist Details (indicate cue type and reason): Pt demosntrates good technique. benefits from VCs for hand placement Toileting- Clothing Manipulation and Hygiene: Min guard;Sit to/from stand Toileting - Clothing Manipulation Details (indicate cue type and reason): Pt completed pericare in sit<>stand with pt providing min guard assist to steady. Discussed min guard with grandaughter and pt.      Functional mobility during ADLs: Min guard;Rolling walker General ADL Comments: Pt needing to void on arrival of OT. Toilet transfer and pericare as detailed above. Discussed strategies for giving instructions to pt with grandaughter (i.e. one thing at a time. )     Vision       Perception     Praxis      Cognition Arousal/Alertness: Awake/alert Behavior During Therapy: WFL for tasks assessed/performed Overall Cognitive Status: Within Functional Limits for tasks assessed Area of Impairment: Problem solving                             Problem Solving: Slow processing;Decreased initiation;Difficulty sequencing;Requires verbal cues;Requires tactile cues General Comments: Difficulty sequencing. Pt's granddaughter reports, "she's scatter-brained sometimes."        Exercises Exercises: Total Joint Total Joint Exercises Ankle Circles/Pumps: AROM;Both;10 reps Quad Sets: AROM;Right;10 reps Heel Slides: AROM;Left;10 reps;Seated Straight Leg Raises: AROM;Left;5 reps;Supine Long Arc Quad: AROM;Left;10 reps;Seated Goniometric ROM:  could not measure as pt too sore   Shoulder Instructions       General Comments Pt will have 24 hour care by husband and he feels that he can assist her at home per conversation.  She was confused today and needed a lot of cues.  Discussed car transfer with pt and husband as well.  PA Kiersten called this PT per PT request and we  discussed the d/c plan and pts confusion.  MD and PA feel it best that pt go home today.  Husband will be with pt 24 hours day.    Pertinent Vitals/ Pain       Pain Assessment: Faces Faces Pain Scale: Hurts even more Pain Location: L knee Pain Descriptors / Indicators: Constant;Discomfort Pain Intervention(s): Limited activity within patient's tolerance;Monitored during session;Repositioned  Home Living                                          Prior Functioning/Environment              Frequency  Min 2X/week        Progress Toward Goals  OT Goals(current goals can now be found in the care plan section)  Progress towards OT goals: Progressing toward goals  Acute Rehab OT Goals Patient Stated Goal: Go home OT Goal Formulation: With patient Time For Goal Achievement: 08/27/16 Potential to Achieve Goals: Good ADL Goals Pt Will Perform Lower Body Bathing: sit to/from stand;with supervision;with set-up Pt Will Perform Lower Body Dressing: with set-up;with supervision;sit to/from stand Pt Will Transfer to Toilet: with min guard assist;ambulating;regular height toilet;grab bars Pt Will Perform Toileting - Clothing Manipulation and hygiene: with min guard assist;sit to/from stand Pt Will Perform Tub/Shower Transfer: with min guard assist;Shower transfer;shower seat  Plan Discharge plan remains appropriate    Co-evaluation                 AM-PAC PT "6 Clicks" Daily Activity     Outcome Measure                    End of Session Equipment Utilized During Treatment: Gait belt;Rolling walker CPM Left Knee CPM Left Knee: Off Additional Comments: bone foam in place on entry to room but pt miserable  OT Visit Diagnosis: Unsteadiness on feet (R26.81);Muscle weakness (generalized) (M62.81);Pain Pain - Right/Left: Left Pain - part of body: Knee   Activity Tolerance Patient tolerated treatment well   Patient Left with family/visitor  present;Other (comment) (in transport chair d/cing)   Nurse Communication          Time: 1245-1300 OT Time Calculation (min): 15 min  Charges: OT General Charges $OT Visit: 1 Procedure OT Treatments $Self Care/Home Management : 8-22 mins   Pilar Grammes 08/15/2016, 1:17 PM

## 2016-08-15 NOTE — Progress Notes (Signed)
Stopped by to visit w/ pt and family in rm: husband and granddaughter. Provided spiritual/emotional support and prayer - emphasizing 23rd Psalm -- which they appreciated. Pt and husband attend [employer] in [place] now, but had married at [facility] in [place].   08/15/16 1200  Clinical Encounter Type  Visited With Patient and family together  Visit Type Initial;Psychological support;Spiritual support;Social support  Spiritual Encounters  Spiritual Needs Prayer;Emotional  Stress Factors  Patient Stress Factors Health changes;Loss of control  Family Stress Factors Family relationships;Health changes;Loss of control   Ephraim Hamburger, [address]

## 2016-08-15 NOTE — Discharge Summary (Signed)
Patient ID: Shannon Mcintyre MRN: [id number] DOB/AGE: 1932-07-04 83 y.o.  Admit date: 08/12/2016 Discharge date: 08/15/2016  Admission Diagnoses:  Principal Problem:   Primary localized osteoarthritis of left knee Active Problems:   Essential hypertension   Eczema   Hyperlipidemia   Discharge Diagnoses:  Same  Past Medical History:  Diagnosis Date  . Arthritis   . Diverticulosis of colon   . Eczema   . Hypertension   . Primary localized osteoarthritis of left knee     Surgeries: Procedure(s): LEFT TOTAL KNEE ARTHROPLASTY on 08/12/2016   Consultants:   Discharged Condition: Improved  Hospital Course: Shannon Mcintyre is an 83 y.o. female who was admitted 08/12/2016 for operative treatment ofPrimary localized osteoarthritis of left knee. Patient has severe unremitting pain that affects sleep, daily activities, and work/hobbies. After pre-op clearance the patient was taken to the operating room on 08/12/2016 and underwent  Procedure(s): LEFT TOTAL KNEE ARTHROPLASTY.    Patient was given perioperative antibiotics: Anti-infectives    Start     Dose/Rate Route Frequency Ordered Stop   08/15/16 0800  cephALEXin (KEFLEX) capsule 500 mg     500 mg Oral 3 times daily before meals & bedtime 08/14/16 2132     08/15/16 0000  cephALEXin (KEFLEX) 500 MG capsule     500 mg Oral 4 times daily 08/15/16 0843     08/14/16 0730  cephALEXin (KEFLEX) capsule 500 mg  Status:  Discontinued     500 mg Oral Every 6 hours 08/14/16 0724 08/14/16 2132   08/12/16 1800  ceFAZolin (ANCEF) IVPB 2g/100 mL premix     2 g 200 mL/hr over 30 Minutes Intravenous Every 6 hours 08/12/16 1554 08/13/16 0019   08/12/16 1000  ceFAZolin (ANCEF) IVPB 2g/100 mL premix     2 g 200 mL/hr over 30 Minutes Intravenous To [facility]Stay Surgical 08/09/16 1055 08/12/16 1116       Patient was given sequential compression devices, early ambulation, and chemoprophylaxis to prevent DVT.  Patient benefited maximally from  hospital stay and there were no complications.    Recent vital signs: Patient Vitals for the past 24 hrs:  BP Temp Temp src Pulse Resp SpO2  08/15/16 0432 104/75 98 F (36.7 C) Oral 87 18 98 %  08/14/16 2040 (!) 121/44 98.5 F (36.9 C) Oral 82 16 95 %     Recent laboratory studies:  Recent Labs  08/14/16 0646 08/15/16 0611  WBC 16.0* 13.9*  HGB 9.6* 10.9*  HCT 29.0* 32.5*  PLT 156 180  NA 142 141  K 4.5 3.9  CL 109 105  CO2 26 25  BUN 23* 27*  CREATININE 0.88 0.94  GLUCOSE 126* 99  CALCIUM 9.1 9.2     Discharge Medications:   Allergies as of 08/15/2016      Reactions   No Known Allergies       Medication List    STOP taking these medications   nitrofurantoin 100 MG capsule Commonly known as:  MACRODANTIN     TAKE these medications   aspirin 325 MG EC tablet 1 tab a day for the next 30 days to prevent blood clots What changed:  medication strength  how much to take  how to take this  when to take this  additional instructions   cephALEXin 500 MG capsule Commonly known as:  KEFLEX Take 1 capsule (500 mg total) by mouth 4 (four) times daily.   docusate sodium 100 MG capsule Commonly known as:  COLACE 1  tab 2 times a day while on narcotics.  STOOL SOFTENER   folic acid 1 MG tablet Commonly known as:  FOLVITE Take 1 mg by mouth daily.   hydrOXYzine 25 MG tablet Commonly known as:  ATARAX/VISTARIL Take 1 tablet (25 mg total) by mouth every 4 (four) hours as needed for itching. What changed:  when to take this   losartan 25 MG tablet Commonly known as:  COZAAR Take 25 mg by mouth daily.   methotrexate 2.5 MG tablet Commonly known as:  RHEUMATREX Take 5-7.5 mg by mouth 3 (three) times a week. Caution:Chemotherapy. Protect from light.  Tuesday 7.5 mg in the am and 5mg  in the am and Wednesday 7.5 mg in the am   oxyCODONE 5 MG immediate release tablet Commonly known as:  Oxy IR/ROXICODONE Take 1-2 tablets (5-10 mg total) by mouth every 3  (three) hours as needed for breakthrough pain.   VESICARE 5 MG tablet Generic drug:  solifenacin TAKE 1 TABLET BY MOUTH DAILY.       Diagnostic Studies: No results found.  Disposition: 01-Home or Self Care  Discharge Instructions    CPM    Complete by:  As directed    Continuous passive motion machine (CPM):      Use the CPM from 0 to 90 for 6 hours per day.       You may break it up into 2 or 3 sessions per day.      Use CPM for 2 weeks or until you are told to stop.   Call MD / Call 911    Complete by:  As directed    If you experience chest pain or shortness of breath, CALL 911 and be transported to the hospital emergency room.  If you develope a fever above 101 F, pus (white drainage) or increased drainage or redness at the wound, or calf pain, call your surgeon's office.   Change dressing    Complete by:  As directed    DO NOT REMOVE BANDAGE OVER SURGICAL INCISION.  WASH WHOLE LEG INCLUDING OVER THE WATERPROOF BANDAGE WITH SOAP AND WATER EVERY DAY.   Constipation Prevention    Complete by:  As directed    Drink plenty of fluids.  Prune juice may be helpful.  You may use a stool softener, such as Colace (over the counter) 100 mg twice a day.  Use MiraLax (over the counter) for constipation as needed.   Diet - low sodium heart healthy    Complete by:  As directed    Discharge instructions    Complete by:  As directed    INSTRUCTIONS AFTER JOINT REPLACEMENT   Remove items at home which could result in a fall. This includes throw rugs or furniture in walking pathways ICE to the affected joint every three hours while awake for 30 minutes at a time, for at least the first 3-5 days, and then as needed for pain and swelling.  Continue to use ice for pain and swelling. You may notice swelling that will progress down to the foot and ankle.  This is normal after surgery.  Elevate your leg when you are not up walking on it.   Continue to use the breathing machine you got in the hospital  (incentive spirometer) which will help keep your temperature down.  It is common for your temperature to cycle up and down following surgery, especially at night when you are not up moving around and exerting yourself.  The breathing machine  keeps your lungs expanded and your temperature down.   DIET:  As you were doing prior to hospitalization, we recommend a well-balanced diet.  DRESSING / WOUND CARE / SHOWERING  Keep the surgical dressing until follow up.  The dressing is water proof, so you can shower without any extra covering.  IF THE DRESSING FALLS OFF or the wound gets wet inside, change the dressing with sterile gauze.  Please use good hand washing techniques before changing the dressing.  Do not use any lotions or creams on the incision until instructed by your surgeon.    ACTIVITY  Increase activity slowly as tolerated, but follow the weight bearing instructions below.   No driving for 6 weeks or until further direction given by your physician.  You cannot drive while taking narcotics.  No lifting or carrying greater than 10 lbs. until further directed by your surgeon. Avoid periods of inactivity such as sitting longer than an hour when not asleep. This helps prevent blood clots.  You may return to work once you are authorized by your doctor.     WEIGHT BEARING   Weight bearing as tolerated with assist device (walker, cane, etc) as directed, use it as long as suggested by your surgeon or therapist, typically at least 2-3 weeks.   EXERCISES  Results after joint replacement surgery are often greatly improved when you follow the exercise, range of motion and muscle strengthening exercises prescribed by your doctor. Safety measures are also important to protect the joint from further injury. Any time any of these exercises cause you to have increased pain or swelling, decrease what you are doing until you are comfortable again and then slowly increase them. If you have problems or  questions, call your caregiver or physical therapist for advice.   Rehabilitation is important following a joint replacement. After just a few days of immobilization, the muscles of the leg can become weakened and shrink (atrophy).  These exercises are designed to build up the tone and strength of the thigh and leg muscles and to improve motion. Often times heat used for twenty to thirty minutes before working out will loosen up your tissues and help with improving the range of motion but do not use heat for the first two weeks following surgery (sometimes heat can increase post-operative swelling).   These exercises can be done on a training (exercise) mat, on the floor, on a table or on a bed. Use whatever works the best and is most comfortable for you.    Use music or television while you are exercising so that the exercises are a pleasant break in your day. This will make your life better with the exercises acting as a break in your routine that you can look forward to.   Perform all exercises about fifteen times, three times per day or as directed.  You should exercise both the operative leg and the other leg as well.   Exercises include:  Quad Sets - Tighten up the muscle on the front of the thigh (Quad) and hold for 5-10 seconds.   Straight Leg Raises - With your knee straight (if you were given a brace, keep it on), lift the leg to 60 degrees, hold for 3 seconds, and slowly lower the leg.  Perform this exercise against resistance later as your leg gets stronger.  Leg Slides: Lying on your back, slowly slide your foot toward your buttocks, bending your knee up off the floor (only go as far as is  comfortable). Then slowly slide your foot back down until your leg is flat on the floor again.  Angel Wings: Lying on your back spread your legs to the side as far apart as you can without causing discomfort.  Hamstring Strength:  Lying on your back, push your heel against the floor with your leg straight  by tightening up the muscles of your buttocks.  Repeat, but this time bend your knee to a comfortable angle, and push your heel against the floor.  You may put a pillow under the heel to make it more comfortable if necessary.   A rehabilitation program following joint replacement surgery can speed recovery and prevent re-injury in the future due to weakened muscles. Contact your doctor or a physical therapist for more information on knee rehabilitation.    CONSTIPATION  Constipation is defined medically as fewer than three stools per week and severe constipation as less than one stool per week.  Even if you have a regular bowel pattern at home, your normal regimen is likely to be disrupted due to multiple reasons following surgery.  Combination of anesthesia, postoperative narcotics, change in appetite and fluid intake all can affect your bowels.   YOU MUST use at least one of the following options; they are listed in order of increasing strength to get the job done.  They are all available over the counter, and you may need to use some, POSSIBLY even all of these options:    Drink plenty of fluids (prune juice may be helpful) and high fiber foods Colace 100 mg by mouth twice a day  Senokot for constipation as directed and as needed Dulcolax (bisacodyl), take with full glass of water  Miralax (polyethylene glycol) once or twice a day as needed.  If you have tried all these things and are unable to have a bowel movement in the first 3-4 days after surgery call either your surgeon or your primary doctor.    If you experience loose stools or diarrhea, hold the medications until you stool forms back up.  If your symptoms do not get better within 1 week or if they get worse, check with your doctor.  If you experience "the worst abdominal pain ever" or develop nausea or vomiting, please contact the office immediately for further recommendations for treatment.   ITCHING:  If you experience itching with  your medications, try taking only a single pain pill, or even half a pain pill at a time.  You can also use Benadryl over the counter for itching or also to help with sleep.   TED HOSE STOCKINGS:  Use stockings on both legs until for at least 2 weeks or as directed by physician office. They may be removed at night for sleeping.  MEDICATIONS:  See your medication summary on the "After Visit Summary" that nursing will review with you.  You may have some home medications which will be placed on hold until you complete the course of blood thinner medication.  It is important for you to complete the blood thinner medication as prescribed.  PRECAUTIONS:  If you experience chest pain or shortness of breath - call 911 immediately for transfer to the hospital emergency department.   If you develop a fever greater that 101 F, purulent drainage from wound, increased redness or drainage from wound, foul odor from the wound/dressing, or calf pain - CONTACT YOUR SURGEON.  FOLLOW-UP APPOINTMENTS:  If you do not already have a post-op appointment, please call the office for an appointment to be seen by your surgeon.  Guidelines for how soon to be seen are listed in your "After Visit Summary", but are typically between 1-4 weeks after surgery.  OTHER INSTRUCTIONS:   Knee Replacement:  Do not place pillow under knee, focus on keeping the knee straight while resting. CPM instructions: 0-90 degrees, 2 hours in the morning, 2 hours in the afternoon, and 2 hours in the evening. Place foam block, curve side up under heel at all times except when in CPM or when walking.  DO NOT modify, tear, cut, or change the foam block in any way.  MAKE SURE YOU:  Understand these instructions.  Get help right away if you are not doing well or get worse.    Thank you for letting us be a part of your medical care team.  It is a privilege we respect greatly.  We hope these instructions  will help you stay on track for a fast and full recovery!   Do not put a pillow under the knee. Place it under the heel.    Complete by:  As directed    Place gray foam block, curve side up under heel at all times except when in CPM or when walking.  DO NOT modify, tear, cut, or change in any way the gray foam block.   Increase activity slowly as tolerated    Complete by:  As directed    Patient may shower    Complete by:  As directed    Aquacel dressing is water proof    Wash over it and the whole leg with soap and water at the end of your shower   TED hose    Complete by:  As directed    Use stockings (TED hose) for 2 weeks on both leg(s).  You may remove them at night for sleeping.      Follow-up Information    [facility] Follow up.   Specialty:  [facility] Contact information: [address] [place] [address] [place] [address] [phone]        Salvatore Marvel, MD Follow up on 08/27/2016.   Specialty:  Orthopedic Surgery Why:  at 11 am Contact information: [address] [place] [address] [phone]        [facility] Follow up on 08/27/2016.   Specialty:  Orthopedic Surgery Why:  Physical Therapy appt at 12 pm at Dr Sherene Sires office Contact information: [address] [place] [address] [phone]            Signed: Pascal Lux 08/15/2016, 8:48 AM

## 2016-08-15 NOTE — Progress Notes (Signed)
PTnote Pt seen by PT and full note to follow.  Pt having a lot of processing difficulty today.  Took pt almost 10 minutes to ascend and descend 2 steps as she could not follow commands for sequencing and kept stepping forward instead of backward.  Pt Needed constant cues to redirect to task and finally was able to ascend and descend steps.  Husband states this behavior is unusual and says that PA is aware.  Pt also had difficulty sequencing steps and RW and kept picking RW up but previous note did state that she needed cues for not picking RW up.  This PT called nurse to notify her of the behavior of the pt and she stated she will let the PA and MD know.  Of note, husband states he feels he can take care of pt and all education with pt and family was completed today. Pt to get HHPT.  Thanks.  [facility] [phone] (pager)

## 2016-08-15 NOTE — Progress Notes (Signed)
Physical Therapy Treatment Patient Details Name: Shannon Mcintyre MRN: [id number] DOB: 25-Sep-1932 Today's Date: 08/15/2016    History of Present Illness Pt admitted for left TKA. PMH includes HTN, arthritis, diverticulosis of colon.    PT Comments    Pt admitted with above diagnosis. Pt currently with functional limitations due to the deficits listed below (see PT Problem List). Pt was able to ambulate with min guard assist and cues with RW.  Had difficulty on steps needing incr cues but husband states he can help her 24 hours day.  HH f/u arranged as well.  All education completed with pt and husband. Pt will benefit from skilled PT to increase their independence and safety with mobility to allow discharge to the venue listed below.     Follow Up Recommendations  Home health PT;Supervision/Assistance - 24 hour     Equipment Recommendations  Rolling walker with 5" wheels;3in1 (PT)    Recommendations for Other Services       Precautions / Restrictions Precautions Precautions: Knee Precaution Booklet Issued: Yes (comment) Precaution Comments: discussed HEP Required Braces or Orthoses: Knee Immobilizer - Left Knee Immobilizer - Left: On at all times;Discontinue once straight leg raise with < 10 degree lag Restrictions Weight Bearing Restrictions: Yes LLE Weight Bearing: Weight bearing as tolerated    Mobility  Bed Mobility               General bed mobility comments: in chair on arrival  Transfers Overall transfer level: Needs assistance Equipment used: Rolling walker (2 wheeled) Transfers: Sit to/from Stand Sit to Stand: Min assist         General transfer comment: On arrival, pt was in discomfort left knee in the bone foam. Discussed with pt and husband that while pt needs to be in the bone foam, if she is having that much pain place a towel roll and work up to the bone foam.  Pt agreed to go and practice steps. Wheeled her in the recliner.  Pt was min assist to  come fully upright and to steady once in standing taking incr time to get balance and stand.      Ambulation/Gait Ambulation/Gait assistance: Min guard Ambulation Distance (Feet): 100 Feet Assistive device: Rolling walker (2 wheeled) Gait Pattern/deviations: Decreased stride length;Step-to pattern;Decreased step length - left;Decreased stance time - left;Decreased weight shift to left Gait velocity: slow Gait velocity interpretation: Below normal speed for age/gender General Gait Details: pt steady with gait overall. Requires min cues for walker management and proximity.  Pt also needed cues not to pick walker up as she did it almost every time she advanced - constant cues needed.   Stairs Stairs: Yes   Stair Management: No rails;Step to pattern;Backwards;With walker Number of Stairs: 2 General stair comments: pts husband Nadine Counts) present for session and instructed on how to assist pt with ascending and descending stairs.  Pt had a difficult time following taking almost 10 min to go up and down 2 steps as she could not process the steps even with constant cuing.    Wheelchair Mobility    Modified Rankin (Stroke Patients Only)       Balance Overall balance assessment: Needs assistance Sitting-balance support: No upper extremity supported;Feet supported Sitting balance-Leahy Scale: Good     Standing balance support: During functional activity;Bilateral upper extremity supported Standing balance-Leahy Scale: Fair Standing balance comment: Needed Min guard A for balance as she was having difficulty getting to standing due to processing and she needed UE  support and cues.                             Cognition Arousal/Alertness: Awake/alert Behavior During Therapy: WFL for tasks assessed/performed Overall Cognitive Status: Within Functional Limits for tasks assessed Area of Impairment: Problem solving                             Problem Solving: Slow  processing;Decreased initiation;Difficulty sequencing;Requires verbal cues;Requires tactile cues General Comments: pt with difficulty sequencing stepping throughout.  Pt very confused during session.      Exercises Total Joint Exercises Ankle Circles/Pumps: AROM;Both;10 reps Quad Sets: AROM;Right;10 reps Heel Slides: AROM;Left;10 reps;Seated Straight Leg Raises: AROM;Left;5 reps;Supine Long Arc Quad: AROM;Left;10 reps;Seated Goniometric ROM: could not measure as pt too sore    General Comments General comments (skin integrity, edema, etc.): Pt will have 24 hour care by husband and he feels that he can assist her at home per conversation.  She was confused today and needed a lot of cues.  Discussed car transfer with pt and husband as well.  PA Kiersten called this PT per PT request and we discussed the d/c plan and pts confusion.  MD and PA feel it best that pt go home today.  Husband will be with pt 24 hours day.      Pertinent Vitals/Pain Pain Assessment: Faces Faces Pain Scale: Hurts whole lot Pain Location: L knee Pain Descriptors / Indicators: Constant;Discomfort Pain Intervention(s): Limited activity within patient's tolerance;Monitored during session;Premedicated before session;Repositioned    Home Living                      Prior Function            PT Goals (current goals can now be found in the care plan section) Progress towards PT goals: Progressing toward goals    Frequency    7X/week      PT Plan Current plan remains appropriate    Co-evaluation              AM-PAC PT "6 Clicks" Daily Activity  Outcome Measure  Difficulty turning over in bed (including adjusting bedclothes, sheets and blankets)?: A Little Difficulty moving from lying on back to sitting on the side of the bed? : A Little Difficulty sitting down on and standing up from a chair with arms (e.g., wheelchair, bedside commode, etc,.)?: A Little Help needed moving to and from a  bed to chair (including a wheelchair)?: A Little Help needed walking in hospital room?: A Little Help needed climbing 3-5 steps with a railing? : A Lot 6 Click Score: 17    End of Session Equipment Utilized During Treatment: Gait belt;Left knee immobilizer Activity Tolerance: Patient limited by pain;Patient limited by fatigue (limited by confusion) Patient left: in chair;with call bell/phone within reach;with family/visitor present Nurse Communication: Mobility status PT Visit Diagnosis: Muscle weakness (generalized) (M62.81);Difficulty in walking, not elsewhere classified (R26.2)     Time: 1020-1108 PT Time Calculation (min) (ACUTE ONLY): 48 min  Charges:  $Gait Training: 8-22 mins $Therapeutic Exercise: 8-22 mins $Self Care/Home Management: 8-22                    G Codes:       Mariateresa Batra,PT Acute Rehabilitation [phone] (pager)    Berline Lopes 08/15/2016, 12:42 PM

## 2016-08-16 DIAGNOSIS — I10 Essential (primary) hypertension: Secondary | ICD-10-CM | POA: Diagnosis not present

## 2016-08-16 DIAGNOSIS — Z96652 Presence of left artificial knee joint: Secondary | ICD-10-CM | POA: Diagnosis not present

## 2016-08-16 DIAGNOSIS — Z471 Aftercare following joint replacement surgery: Secondary | ICD-10-CM | POA: Diagnosis not present

## 2016-08-17 DIAGNOSIS — Z471 Aftercare following joint replacement surgery: Secondary | ICD-10-CM | POA: Diagnosis not present

## 2016-08-17 DIAGNOSIS — I10 Essential (primary) hypertension: Secondary | ICD-10-CM | POA: Diagnosis not present

## 2016-08-17 DIAGNOSIS — Z96652 Presence of left artificial knee joint: Secondary | ICD-10-CM | POA: Diagnosis not present

## 2016-08-18 DIAGNOSIS — Z96652 Presence of left artificial knee joint: Secondary | ICD-10-CM | POA: Diagnosis not present

## 2016-08-18 DIAGNOSIS — I10 Essential (primary) hypertension: Secondary | ICD-10-CM | POA: Diagnosis not present

## 2016-08-18 DIAGNOSIS — Z471 Aftercare following joint replacement surgery: Secondary | ICD-10-CM | POA: Diagnosis not present

## 2016-08-20 DIAGNOSIS — I10 Essential (primary) hypertension: Secondary | ICD-10-CM | POA: Diagnosis not present

## 2016-08-20 DIAGNOSIS — Z471 Aftercare following joint replacement surgery: Secondary | ICD-10-CM | POA: Diagnosis not present

## 2016-08-20 DIAGNOSIS — Z96652 Presence of left artificial knee joint: Secondary | ICD-10-CM | POA: Diagnosis not present

## 2016-08-21 DIAGNOSIS — I10 Essential (primary) hypertension: Secondary | ICD-10-CM | POA: Diagnosis not present

## 2016-08-21 DIAGNOSIS — Z96652 Presence of left artificial knee joint: Secondary | ICD-10-CM | POA: Diagnosis not present

## 2016-08-21 DIAGNOSIS — Z471 Aftercare following joint replacement surgery: Secondary | ICD-10-CM | POA: Diagnosis not present

## 2016-08-21 DIAGNOSIS — M1712 Unilateral primary osteoarthritis, left knee: Secondary | ICD-10-CM | POA: Diagnosis not present

## 2016-08-23 ENCOUNTER — Other Ambulatory Visit (HOSPITAL_COMMUNITY): Payer: Medicare Other

## 2016-08-24 DIAGNOSIS — Z96652 Presence of left artificial knee joint: Secondary | ICD-10-CM | POA: Diagnosis not present

## 2016-08-24 DIAGNOSIS — Z471 Aftercare following joint replacement surgery: Secondary | ICD-10-CM | POA: Diagnosis not present

## 2016-08-24 DIAGNOSIS — I10 Essential (primary) hypertension: Secondary | ICD-10-CM | POA: Diagnosis not present

## 2016-08-27 DIAGNOSIS — M1712 Unilateral primary osteoarthritis, left knee: Secondary | ICD-10-CM | POA: Diagnosis not present

## 2016-08-27 DIAGNOSIS — M25562 Pain in left knee: Secondary | ICD-10-CM | POA: Diagnosis not present

## 2016-08-27 DIAGNOSIS — M6281 Muscle weakness (generalized): Secondary | ICD-10-CM | POA: Diagnosis not present

## 2016-08-27 DIAGNOSIS — R262 Difficulty in walking, not elsewhere classified: Secondary | ICD-10-CM | POA: Diagnosis not present

## 2016-08-29 DIAGNOSIS — M25562 Pain in left knee: Secondary | ICD-10-CM | POA: Diagnosis not present

## 2016-08-29 DIAGNOSIS — R262 Difficulty in walking, not elsewhere classified: Secondary | ICD-10-CM | POA: Diagnosis not present

## 2016-08-29 DIAGNOSIS — M1712 Unilateral primary osteoarthritis, left knee: Secondary | ICD-10-CM | POA: Diagnosis not present

## 2016-08-29 DIAGNOSIS — M6281 Muscle weakness (generalized): Secondary | ICD-10-CM | POA: Diagnosis not present

## 2016-09-02 DIAGNOSIS — M1712 Unilateral primary osteoarthritis, left knee: Secondary | ICD-10-CM | POA: Diagnosis not present

## 2016-09-02 DIAGNOSIS — M25562 Pain in left knee: Secondary | ICD-10-CM | POA: Diagnosis not present

## 2016-09-02 DIAGNOSIS — R262 Difficulty in walking, not elsewhere classified: Secondary | ICD-10-CM | POA: Diagnosis not present

## 2016-09-02 DIAGNOSIS — M6281 Muscle weakness (generalized): Secondary | ICD-10-CM | POA: Diagnosis not present

## 2016-09-04 DIAGNOSIS — R262 Difficulty in walking, not elsewhere classified: Secondary | ICD-10-CM | POA: Diagnosis not present

## 2016-09-04 DIAGNOSIS — M6281 Muscle weakness (generalized): Secondary | ICD-10-CM | POA: Diagnosis not present

## 2016-09-04 DIAGNOSIS — M1712 Unilateral primary osteoarthritis, left knee: Secondary | ICD-10-CM | POA: Diagnosis not present

## 2016-09-04 DIAGNOSIS — M25562 Pain in left knee: Secondary | ICD-10-CM | POA: Diagnosis not present

## 2016-09-06 DIAGNOSIS — R262 Difficulty in walking, not elsewhere classified: Secondary | ICD-10-CM | POA: Diagnosis not present

## 2016-09-06 DIAGNOSIS — M25562 Pain in left knee: Secondary | ICD-10-CM | POA: Diagnosis not present

## 2016-09-06 DIAGNOSIS — M1712 Unilateral primary osteoarthritis, left knee: Secondary | ICD-10-CM | POA: Diagnosis not present

## 2016-09-06 DIAGNOSIS — M6281 Muscle weakness (generalized): Secondary | ICD-10-CM | POA: Diagnosis not present

## 2016-09-06 NOTE — Addendum Note (Signed)
Addendum  created 09/06/16 1235 by Tanay Massiah, MD   Sign clinical note    

## 2016-09-09 DIAGNOSIS — M1712 Unilateral primary osteoarthritis, left knee: Secondary | ICD-10-CM | POA: Diagnosis not present

## 2016-09-09 DIAGNOSIS — M6281 Muscle weakness (generalized): Secondary | ICD-10-CM | POA: Diagnosis not present

## 2016-09-09 DIAGNOSIS — M25562 Pain in left knee: Secondary | ICD-10-CM | POA: Diagnosis not present

## 2016-09-09 DIAGNOSIS — R262 Difficulty in walking, not elsewhere classified: Secondary | ICD-10-CM | POA: Diagnosis not present

## 2016-09-11 DIAGNOSIS — M1712 Unilateral primary osteoarthritis, left knee: Secondary | ICD-10-CM | POA: Diagnosis not present

## 2016-09-11 DIAGNOSIS — M6281 Muscle weakness (generalized): Secondary | ICD-10-CM | POA: Diagnosis not present

## 2016-09-11 DIAGNOSIS — M25562 Pain in left knee: Secondary | ICD-10-CM | POA: Diagnosis not present

## 2016-09-11 DIAGNOSIS — R262 Difficulty in walking, not elsewhere classified: Secondary | ICD-10-CM | POA: Diagnosis not present

## 2016-09-13 DIAGNOSIS — M6281 Muscle weakness (generalized): Secondary | ICD-10-CM | POA: Diagnosis not present

## 2016-09-13 DIAGNOSIS — R262 Difficulty in walking, not elsewhere classified: Secondary | ICD-10-CM | POA: Diagnosis not present

## 2016-09-13 DIAGNOSIS — M1712 Unilateral primary osteoarthritis, left knee: Secondary | ICD-10-CM | POA: Diagnosis not present

## 2016-09-13 DIAGNOSIS — M25562 Pain in left knee: Secondary | ICD-10-CM | POA: Diagnosis not present

## 2016-09-16 DIAGNOSIS — M6281 Muscle weakness (generalized): Secondary | ICD-10-CM | POA: Diagnosis not present

## 2016-09-16 DIAGNOSIS — M1712 Unilateral primary osteoarthritis, left knee: Secondary | ICD-10-CM | POA: Diagnosis not present

## 2016-09-16 DIAGNOSIS — M25562 Pain in left knee: Secondary | ICD-10-CM | POA: Diagnosis not present

## 2016-09-16 DIAGNOSIS — R262 Difficulty in walking, not elsewhere classified: Secondary | ICD-10-CM | POA: Diagnosis not present

## 2016-09-18 DIAGNOSIS — M1712 Unilateral primary osteoarthritis, left knee: Secondary | ICD-10-CM | POA: Diagnosis not present

## 2016-09-18 DIAGNOSIS — M6281 Muscle weakness (generalized): Secondary | ICD-10-CM | POA: Diagnosis not present

## 2016-09-18 DIAGNOSIS — R262 Difficulty in walking, not elsewhere classified: Secondary | ICD-10-CM | POA: Diagnosis not present

## 2016-09-18 DIAGNOSIS — M25562 Pain in left knee: Secondary | ICD-10-CM | POA: Diagnosis not present

## 2016-09-20 DIAGNOSIS — M6281 Muscle weakness (generalized): Secondary | ICD-10-CM | POA: Diagnosis not present

## 2016-09-20 DIAGNOSIS — M25562 Pain in left knee: Secondary | ICD-10-CM | POA: Diagnosis not present

## 2016-09-20 DIAGNOSIS — M1712 Unilateral primary osteoarthritis, left knee: Secondary | ICD-10-CM | POA: Diagnosis not present

## 2016-09-20 DIAGNOSIS — R262 Difficulty in walking, not elsewhere classified: Secondary | ICD-10-CM | POA: Diagnosis not present

## 2016-09-23 DIAGNOSIS — M25562 Pain in left knee: Secondary | ICD-10-CM | POA: Diagnosis not present

## 2016-09-23 DIAGNOSIS — R262 Difficulty in walking, not elsewhere classified: Secondary | ICD-10-CM | POA: Diagnosis not present

## 2016-09-23 DIAGNOSIS — M6281 Muscle weakness (generalized): Secondary | ICD-10-CM | POA: Diagnosis not present

## 2016-09-23 DIAGNOSIS — M1712 Unilateral primary osteoarthritis, left knee: Secondary | ICD-10-CM | POA: Diagnosis not present

## 2016-09-24 DIAGNOSIS — M1712 Unilateral primary osteoarthritis, left knee: Secondary | ICD-10-CM | POA: Diagnosis not present

## 2016-09-25 DIAGNOSIS — M25562 Pain in left knee: Secondary | ICD-10-CM | POA: Diagnosis not present

## 2016-09-25 DIAGNOSIS — R262 Difficulty in walking, not elsewhere classified: Secondary | ICD-10-CM | POA: Diagnosis not present

## 2016-09-25 DIAGNOSIS — M6281 Muscle weakness (generalized): Secondary | ICD-10-CM | POA: Diagnosis not present

## 2016-09-25 DIAGNOSIS — M1712 Unilateral primary osteoarthritis, left knee: Secondary | ICD-10-CM | POA: Diagnosis not present

## 2016-09-30 DIAGNOSIS — M25562 Pain in left knee: Secondary | ICD-10-CM | POA: Diagnosis not present

## 2016-09-30 DIAGNOSIS — M1712 Unilateral primary osteoarthritis, left knee: Secondary | ICD-10-CM | POA: Diagnosis not present

## 2016-09-30 DIAGNOSIS — M6281 Muscle weakness (generalized): Secondary | ICD-10-CM | POA: Diagnosis not present

## 2016-09-30 DIAGNOSIS — R262 Difficulty in walking, not elsewhere classified: Secondary | ICD-10-CM | POA: Diagnosis not present

## 2016-10-03 DIAGNOSIS — M1712 Unilateral primary osteoarthritis, left knee: Secondary | ICD-10-CM | POA: Diagnosis not present

## 2016-10-03 DIAGNOSIS — R262 Difficulty in walking, not elsewhere classified: Secondary | ICD-10-CM | POA: Diagnosis not present

## 2016-10-03 DIAGNOSIS — M6281 Muscle weakness (generalized): Secondary | ICD-10-CM | POA: Diagnosis not present

## 2016-10-03 DIAGNOSIS — M25562 Pain in left knee: Secondary | ICD-10-CM | POA: Diagnosis not present

## 2016-10-07 DIAGNOSIS — R262 Difficulty in walking, not elsewhere classified: Secondary | ICD-10-CM | POA: Diagnosis not present

## 2016-10-07 DIAGNOSIS — M25562 Pain in left knee: Secondary | ICD-10-CM | POA: Diagnosis not present

## 2016-10-07 DIAGNOSIS — M6281 Muscle weakness (generalized): Secondary | ICD-10-CM | POA: Diagnosis not present

## 2016-10-07 DIAGNOSIS — M1712 Unilateral primary osteoarthritis, left knee: Secondary | ICD-10-CM | POA: Diagnosis not present

## 2016-10-09 DIAGNOSIS — M25562 Pain in left knee: Secondary | ICD-10-CM | POA: Diagnosis not present

## 2016-10-09 DIAGNOSIS — M1712 Unilateral primary osteoarthritis, left knee: Secondary | ICD-10-CM | POA: Diagnosis not present

## 2016-10-09 DIAGNOSIS — R262 Difficulty in walking, not elsewhere classified: Secondary | ICD-10-CM | POA: Diagnosis not present

## 2016-10-09 DIAGNOSIS — M6281 Muscle weakness (generalized): Secondary | ICD-10-CM | POA: Diagnosis not present

## 2016-10-11 DIAGNOSIS — R262 Difficulty in walking, not elsewhere classified: Secondary | ICD-10-CM | POA: Diagnosis not present

## 2016-10-11 DIAGNOSIS — M1712 Unilateral primary osteoarthritis, left knee: Secondary | ICD-10-CM | POA: Diagnosis not present

## 2016-10-11 DIAGNOSIS — M6281 Muscle weakness (generalized): Secondary | ICD-10-CM | POA: Diagnosis not present

## 2016-10-11 DIAGNOSIS — M25562 Pain in left knee: Secondary | ICD-10-CM | POA: Diagnosis not present

## 2016-10-14 DIAGNOSIS — M1712 Unilateral primary osteoarthritis, left knee: Secondary | ICD-10-CM | POA: Diagnosis not present

## 2016-10-16 DIAGNOSIS — M25562 Pain in left knee: Secondary | ICD-10-CM | POA: Diagnosis not present

## 2016-10-16 DIAGNOSIS — M6281 Muscle weakness (generalized): Secondary | ICD-10-CM | POA: Diagnosis not present

## 2016-10-16 DIAGNOSIS — Z1231 Encounter for screening mammogram for malignant neoplasm of breast: Secondary | ICD-10-CM | POA: Diagnosis not present

## 2016-10-16 DIAGNOSIS — M1712 Unilateral primary osteoarthritis, left knee: Secondary | ICD-10-CM | POA: Diagnosis not present

## 2016-10-16 DIAGNOSIS — M810 Age-related osteoporosis without current pathological fracture: Secondary | ICD-10-CM | POA: Diagnosis not present

## 2016-10-16 DIAGNOSIS — R262 Difficulty in walking, not elsewhere classified: Secondary | ICD-10-CM | POA: Diagnosis not present

## 2016-10-21 DIAGNOSIS — R262 Difficulty in walking, not elsewhere classified: Secondary | ICD-10-CM | POA: Diagnosis not present

## 2016-10-21 DIAGNOSIS — M1712 Unilateral primary osteoarthritis, left knee: Secondary | ICD-10-CM | POA: Diagnosis not present

## 2016-10-21 DIAGNOSIS — M25562 Pain in left knee: Secondary | ICD-10-CM | POA: Diagnosis not present

## 2016-10-21 DIAGNOSIS — M6281 Muscle weakness (generalized): Secondary | ICD-10-CM | POA: Diagnosis not present

## 2016-10-23 DIAGNOSIS — M25562 Pain in left knee: Secondary | ICD-10-CM | POA: Diagnosis not present

## 2016-10-23 DIAGNOSIS — M6281 Muscle weakness (generalized): Secondary | ICD-10-CM | POA: Diagnosis not present

## 2016-10-23 DIAGNOSIS — R262 Difficulty in walking, not elsewhere classified: Secondary | ICD-10-CM | POA: Diagnosis not present

## 2016-10-23 DIAGNOSIS — M1712 Unilateral primary osteoarthritis, left knee: Secondary | ICD-10-CM | POA: Diagnosis not present

## 2016-10-25 DIAGNOSIS — M19042 Primary osteoarthritis, left hand: Secondary | ICD-10-CM | POA: Diagnosis not present

## 2016-10-25 DIAGNOSIS — M810 Age-related osteoporosis without current pathological fracture: Secondary | ICD-10-CM | POA: Diagnosis not present

## 2016-10-25 DIAGNOSIS — M25562 Pain in left knee: Secondary | ICD-10-CM | POA: Diagnosis not present

## 2016-10-25 DIAGNOSIS — M19041 Primary osteoarthritis, right hand: Secondary | ICD-10-CM | POA: Diagnosis not present

## 2016-10-25 DIAGNOSIS — M1712 Unilateral primary osteoarthritis, left knee: Secondary | ICD-10-CM | POA: Diagnosis not present

## 2016-10-25 DIAGNOSIS — M1990 Unspecified osteoarthritis, unspecified site: Secondary | ICD-10-CM | POA: Diagnosis not present

## 2016-10-25 DIAGNOSIS — I129 Hypertensive chronic kidney disease with stage 1 through stage 4 chronic kidney disease, or unspecified chronic kidney disease: Secondary | ICD-10-CM | POA: Diagnosis not present

## 2016-10-25 DIAGNOSIS — M6281 Muscle weakness (generalized): Secondary | ICD-10-CM | POA: Diagnosis not present

## 2016-10-25 DIAGNOSIS — N183 Chronic kidney disease, stage 3 (moderate): Secondary | ICD-10-CM | POA: Diagnosis not present

## 2016-10-25 DIAGNOSIS — R262 Difficulty in walking, not elsewhere classified: Secondary | ICD-10-CM | POA: Diagnosis not present

## 2016-10-28 DIAGNOSIS — M6281 Muscle weakness (generalized): Secondary | ICD-10-CM | POA: Diagnosis not present

## 2016-10-28 DIAGNOSIS — M25562 Pain in left knee: Secondary | ICD-10-CM | POA: Diagnosis not present

## 2016-10-28 DIAGNOSIS — R262 Difficulty in walking, not elsewhere classified: Secondary | ICD-10-CM | POA: Diagnosis not present

## 2016-10-28 DIAGNOSIS — M1712 Unilateral primary osteoarthritis, left knee: Secondary | ICD-10-CM | POA: Diagnosis not present

## 2016-10-30 DIAGNOSIS — M6281 Muscle weakness (generalized): Secondary | ICD-10-CM | POA: Diagnosis not present

## 2016-10-30 DIAGNOSIS — R262 Difficulty in walking, not elsewhere classified: Secondary | ICD-10-CM | POA: Diagnosis not present

## 2016-10-30 DIAGNOSIS — M25562 Pain in left knee: Secondary | ICD-10-CM | POA: Diagnosis not present

## 2016-10-30 DIAGNOSIS — M1712 Unilateral primary osteoarthritis, left knee: Secondary | ICD-10-CM | POA: Diagnosis not present

## 2016-11-05 DIAGNOSIS — L720 Epidermal cyst: Secondary | ICD-10-CM | POA: Diagnosis not present

## 2016-11-05 DIAGNOSIS — Z79899 Other long term (current) drug therapy: Secondary | ICD-10-CM | POA: Diagnosis not present

## 2016-11-05 DIAGNOSIS — L400 Psoriasis vulgaris: Secondary | ICD-10-CM | POA: Diagnosis not present

## 2016-11-05 DIAGNOSIS — L821 Other seborrheic keratosis: Secondary | ICD-10-CM | POA: Diagnosis not present

## 2016-11-07 DIAGNOSIS — M1712 Unilateral primary osteoarthritis, left knee: Secondary | ICD-10-CM | POA: Diagnosis not present

## 2016-11-13 DIAGNOSIS — M810 Age-related osteoporosis without current pathological fracture: Secondary | ICD-10-CM | POA: Diagnosis not present

## 2016-12-12 DIAGNOSIS — H6531 Chronic mucoid otitis media, right ear: Secondary | ICD-10-CM | POA: Diagnosis not present

## 2016-12-12 DIAGNOSIS — H6981 Other specified disorders of Eustachian tube, right ear: Secondary | ICD-10-CM | POA: Diagnosis not present

## 2016-12-12 DIAGNOSIS — H906 Mixed conductive and sensorineural hearing loss, bilateral: Secondary | ICD-10-CM | POA: Diagnosis not present

## 2016-12-23 DIAGNOSIS — Z1389 Encounter for screening for other disorder: Secondary | ICD-10-CM | POA: Diagnosis not present

## 2016-12-23 DIAGNOSIS — I129 Hypertensive chronic kidney disease with stage 1 through stage 4 chronic kidney disease, or unspecified chronic kidney disease: Secondary | ICD-10-CM | POA: Diagnosis not present

## 2016-12-23 DIAGNOSIS — N183 Chronic kidney disease, stage 3 (moderate): Secondary | ICD-10-CM | POA: Diagnosis not present

## 2016-12-23 DIAGNOSIS — Z0000 Encounter for general adult medical examination without abnormal findings: Secondary | ICD-10-CM | POA: Diagnosis not present

## 2016-12-23 DIAGNOSIS — Z23 Encounter for immunization: Secondary | ICD-10-CM | POA: Diagnosis not present

## 2016-12-23 DIAGNOSIS — K219 Gastro-esophageal reflux disease without esophagitis: Secondary | ICD-10-CM | POA: Diagnosis not present

## 2016-12-26 DIAGNOSIS — H906 Mixed conductive and sensorineural hearing loss, bilateral: Secondary | ICD-10-CM | POA: Diagnosis not present

## 2016-12-26 DIAGNOSIS — H6981 Other specified disorders of Eustachian tube, right ear: Secondary | ICD-10-CM | POA: Diagnosis not present

## 2016-12-26 DIAGNOSIS — H6531 Chronic mucoid otitis media, right ear: Secondary | ICD-10-CM | POA: Diagnosis not present

## 2017-01-08 DIAGNOSIS — H903 Sensorineural hearing loss, bilateral: Secondary | ICD-10-CM | POA: Diagnosis not present

## 2017-01-08 DIAGNOSIS — H6981 Other specified disorders of Eustachian tube, right ear: Secondary | ICD-10-CM | POA: Diagnosis not present

## 2017-02-05 DIAGNOSIS — H6981 Other specified disorders of Eustachian tube, right ear: Secondary | ICD-10-CM | POA: Diagnosis not present

## 2017-02-05 DIAGNOSIS — H903 Sensorineural hearing loss, bilateral: Secondary | ICD-10-CM | POA: Diagnosis not present

## 2017-02-18 DIAGNOSIS — M1712 Unilateral primary osteoarthritis, left knee: Secondary | ICD-10-CM | POA: Diagnosis not present

## 2017-02-26 DIAGNOSIS — Z79899 Other long term (current) drug therapy: Secondary | ICD-10-CM | POA: Diagnosis not present

## 2017-02-26 DIAGNOSIS — L821 Other seborrheic keratosis: Secondary | ICD-10-CM | POA: Diagnosis not present

## 2017-02-26 DIAGNOSIS — L400 Psoriasis vulgaris: Secondary | ICD-10-CM | POA: Diagnosis not present

## 2017-05-06 DIAGNOSIS — N3946 Mixed incontinence: Secondary | ICD-10-CM | POA: Diagnosis not present

## 2017-05-06 DIAGNOSIS — N3020 Other chronic cystitis without hematuria: Secondary | ICD-10-CM | POA: Diagnosis not present

## 2017-06-23 DIAGNOSIS — I129 Hypertensive chronic kidney disease with stage 1 through stage 4 chronic kidney disease, or unspecified chronic kidney disease: Secondary | ICD-10-CM | POA: Diagnosis not present

## 2017-06-23 DIAGNOSIS — N183 Chronic kidney disease, stage 3 (moderate): Secondary | ICD-10-CM | POA: Diagnosis not present

## 2017-07-11 DIAGNOSIS — L400 Psoriasis vulgaris: Secondary | ICD-10-CM | POA: Diagnosis not present

## 2017-07-11 DIAGNOSIS — Z79899 Other long term (current) drug therapy: Secondary | ICD-10-CM | POA: Diagnosis not present

## 2017-07-21 DIAGNOSIS — H903 Sensorineural hearing loss, bilateral: Secondary | ICD-10-CM | POA: Diagnosis not present

## 2017-07-21 DIAGNOSIS — H6981 Other specified disorders of Eustachian tube, right ear: Secondary | ICD-10-CM | POA: Diagnosis not present

## 2017-08-14 ENCOUNTER — Other Ambulatory Visit: Payer: Self-pay | Admitting: Geriatric Medicine

## 2017-08-14 ENCOUNTER — Ambulatory Visit
Admission: RE | Admit: 2017-08-14 | Discharge: 2017-08-14 | Disposition: A | Discharge status: Home / Self Care | Payer: Medicare Other | Source: Ambulatory Visit | Attending: Geriatric Medicine | Admitting: Geriatric Medicine

## 2017-08-14 DIAGNOSIS — J069 Acute upper respiratory infection, unspecified: Secondary | ICD-10-CM

## 2017-08-14 DIAGNOSIS — B9789 Other viral agents as the cause of diseases classified elsewhere: Secondary | ICD-10-CM | POA: Diagnosis not present

## 2017-08-14 DIAGNOSIS — R05 Cough: Secondary | ICD-10-CM | POA: Diagnosis not present

## 2017-08-22 ENCOUNTER — Other Ambulatory Visit: Payer: Self-pay | Admitting: Geriatric Medicine

## 2017-08-22 ENCOUNTER — Ambulatory Visit
Admission: RE | Admit: 2017-08-22 | Discharge: 2017-08-22 | Disposition: A | Discharge status: Home / Self Care | Payer: Medicare Other | Source: Ambulatory Visit | Attending: Geriatric Medicine | Admitting: Geriatric Medicine

## 2017-08-22 DIAGNOSIS — J189 Pneumonia, unspecified organism: Secondary | ICD-10-CM

## 2017-08-22 DIAGNOSIS — R05 Cough: Secondary | ICD-10-CM | POA: Diagnosis not present

## 2017-08-22 DIAGNOSIS — J181 Lobar pneumonia, unspecified organism: Principal | ICD-10-CM

## 2017-09-02 ENCOUNTER — Ambulatory Visit
Admission: RE | Admit: 2017-09-02 | Discharge: 2017-09-02 | Disposition: A | Discharge status: Home / Self Care | Payer: Medicare Other | Source: Ambulatory Visit | Attending: Geriatric Medicine | Admitting: Geriatric Medicine

## 2017-09-02 ENCOUNTER — Other Ambulatory Visit: Payer: Self-pay | Admitting: Geriatric Medicine

## 2017-09-02 DIAGNOSIS — J181 Lobar pneumonia, unspecified organism: Principal | ICD-10-CM

## 2017-09-02 DIAGNOSIS — J189 Pneumonia, unspecified organism: Secondary | ICD-10-CM

## 2017-09-23 ENCOUNTER — Ambulatory Visit
Admission: RE | Admit: 2017-09-23 | Discharge: 2017-09-23 | Disposition: A | Discharge status: Home / Self Care | Payer: Medicare Other | Source: Ambulatory Visit | Attending: Geriatric Medicine | Admitting: Geriatric Medicine

## 2017-09-23 ENCOUNTER — Other Ambulatory Visit: Payer: Self-pay | Admitting: Geriatric Medicine

## 2017-09-23 DIAGNOSIS — J90 Pleural effusion, not elsewhere classified: Secondary | ICD-10-CM

## 2017-10-22 ENCOUNTER — Other Ambulatory Visit: Payer: Self-pay | Admitting: Geriatric Medicine

## 2017-10-22 ENCOUNTER — Ambulatory Visit
Admission: RE | Admit: 2017-10-22 | Discharge: 2017-10-22 | Disposition: A | Discharge status: Home / Self Care | Payer: Medicare Other | Source: Ambulatory Visit | Attending: Geriatric Medicine | Admitting: Geriatric Medicine

## 2017-10-22 DIAGNOSIS — R9389 Abnormal findings on diagnostic imaging of other specified body structures: Secondary | ICD-10-CM

## 2017-10-22 DIAGNOSIS — R918 Other nonspecific abnormal finding of lung field: Secondary | ICD-10-CM | POA: Diagnosis not present

## 2017-11-12 DIAGNOSIS — H903 Sensorineural hearing loss, bilateral: Secondary | ICD-10-CM | POA: Diagnosis not present

## 2017-11-12 DIAGNOSIS — H6981 Other specified disorders of Eustachian tube, right ear: Secondary | ICD-10-CM | POA: Diagnosis not present

## 2017-11-12 DIAGNOSIS — T161XXA Foreign body in right ear, initial encounter: Secondary | ICD-10-CM | POA: Diagnosis not present

## 2017-11-14 DIAGNOSIS — L400 Psoriasis vulgaris: Secondary | ICD-10-CM | POA: Diagnosis not present

## 2017-11-14 DIAGNOSIS — Z79899 Other long term (current) drug therapy: Secondary | ICD-10-CM | POA: Diagnosis not present

## 2017-11-19 ENCOUNTER — Ambulatory Visit
Admission: RE | Admit: 2017-11-19 | Discharge: 2017-11-19 | Disposition: A | Discharge status: Home / Self Care | Payer: Medicare Other | Source: Ambulatory Visit | Attending: Geriatric Medicine | Admitting: Geriatric Medicine

## 2017-11-19 ENCOUNTER — Other Ambulatory Visit: Payer: Self-pay | Admitting: Geriatric Medicine

## 2017-11-19 DIAGNOSIS — J90 Pleural effusion, not elsewhere classified: Secondary | ICD-10-CM

## 2017-12-22 ENCOUNTER — Ambulatory Visit
Admission: RE | Admit: 2017-12-22 | Discharge: 2017-12-22 | Disposition: A | Discharge status: Home / Self Care | Payer: Medicare Other | Source: Ambulatory Visit | Attending: Geriatric Medicine | Admitting: Geriatric Medicine

## 2017-12-22 ENCOUNTER — Other Ambulatory Visit: Payer: Self-pay | Admitting: Geriatric Medicine

## 2017-12-22 DIAGNOSIS — R918 Other nonspecific abnormal finding of lung field: Secondary | ICD-10-CM

## 2017-12-22 DIAGNOSIS — J948 Other specified pleural conditions: Secondary | ICD-10-CM | POA: Diagnosis not present

## 2017-12-24 DIAGNOSIS — Z23 Encounter for immunization: Secondary | ICD-10-CM | POA: Diagnosis not present

## 2018-01-16 ENCOUNTER — Encounter: Payer: Self-pay | Admitting: Physician Assistant

## 2018-01-19 DIAGNOSIS — N183 Chronic kidney disease, stage 3 (moderate): Secondary | ICD-10-CM | POA: Diagnosis not present

## 2018-01-19 DIAGNOSIS — R9389 Abnormal findings on diagnostic imaging of other specified body structures: Secondary | ICD-10-CM | POA: Diagnosis not present

## 2018-01-19 DIAGNOSIS — Z0000 Encounter for general adult medical examination without abnormal findings: Secondary | ICD-10-CM | POA: Diagnosis not present

## 2018-01-19 DIAGNOSIS — I129 Hypertensive chronic kidney disease with stage 1 through stage 4 chronic kidney disease, or unspecified chronic kidney disease: Secondary | ICD-10-CM | POA: Diagnosis not present

## 2018-01-19 DIAGNOSIS — Z1389 Encounter for screening for other disorder: Secondary | ICD-10-CM | POA: Diagnosis not present

## 2018-01-20 ENCOUNTER — Other Ambulatory Visit: Payer: Self-pay | Admitting: Geriatric Medicine

## 2018-01-20 ENCOUNTER — Ambulatory Visit
Admission: RE | Admit: 2018-01-20 | Discharge: 2018-01-20 | Disposition: A | Discharge status: Home / Self Care | Payer: Medicare Other | Source: Ambulatory Visit | Attending: Geriatric Medicine | Admitting: Geriatric Medicine

## 2018-01-20 DIAGNOSIS — R9389 Abnormal findings on diagnostic imaging of other specified body structures: Secondary | ICD-10-CM | POA: Diagnosis not present

## 2018-02-04 ENCOUNTER — Ambulatory Visit: Payer: Medicare Other | Admitting: Physician Assistant

## 2018-02-17 DIAGNOSIS — R350 Frequency of micturition: Secondary | ICD-10-CM | POA: Diagnosis not present

## 2018-02-17 DIAGNOSIS — N3020 Other chronic cystitis without hematuria: Secondary | ICD-10-CM | POA: Diagnosis not present

## 2018-03-17 DIAGNOSIS — N3946 Mixed incontinence: Secondary | ICD-10-CM | POA: Diagnosis not present

## 2018-03-17 DIAGNOSIS — R350 Frequency of micturition: Secondary | ICD-10-CM | POA: Diagnosis not present

## 2018-03-31 DIAGNOSIS — L401 Generalized pustular psoriasis: Secondary | ICD-10-CM | POA: Diagnosis not present

## 2018-03-31 DIAGNOSIS — Z79899 Other long term (current) drug therapy: Secondary | ICD-10-CM | POA: Diagnosis not present

## 2018-03-31 DIAGNOSIS — L400 Psoriasis vulgaris: Secondary | ICD-10-CM | POA: Diagnosis not present

## 2018-06-09 IMAGING — DX DG CHEST 2V
2 series · 2 of 2 positions shown · non-contrast
Comparison: Chest x-ray 10/25/2009.

CLINICAL DATA: Productive cough.

EXAM:
CHEST - 2 VIEW

[dg chest 2 view (1 of 2)]
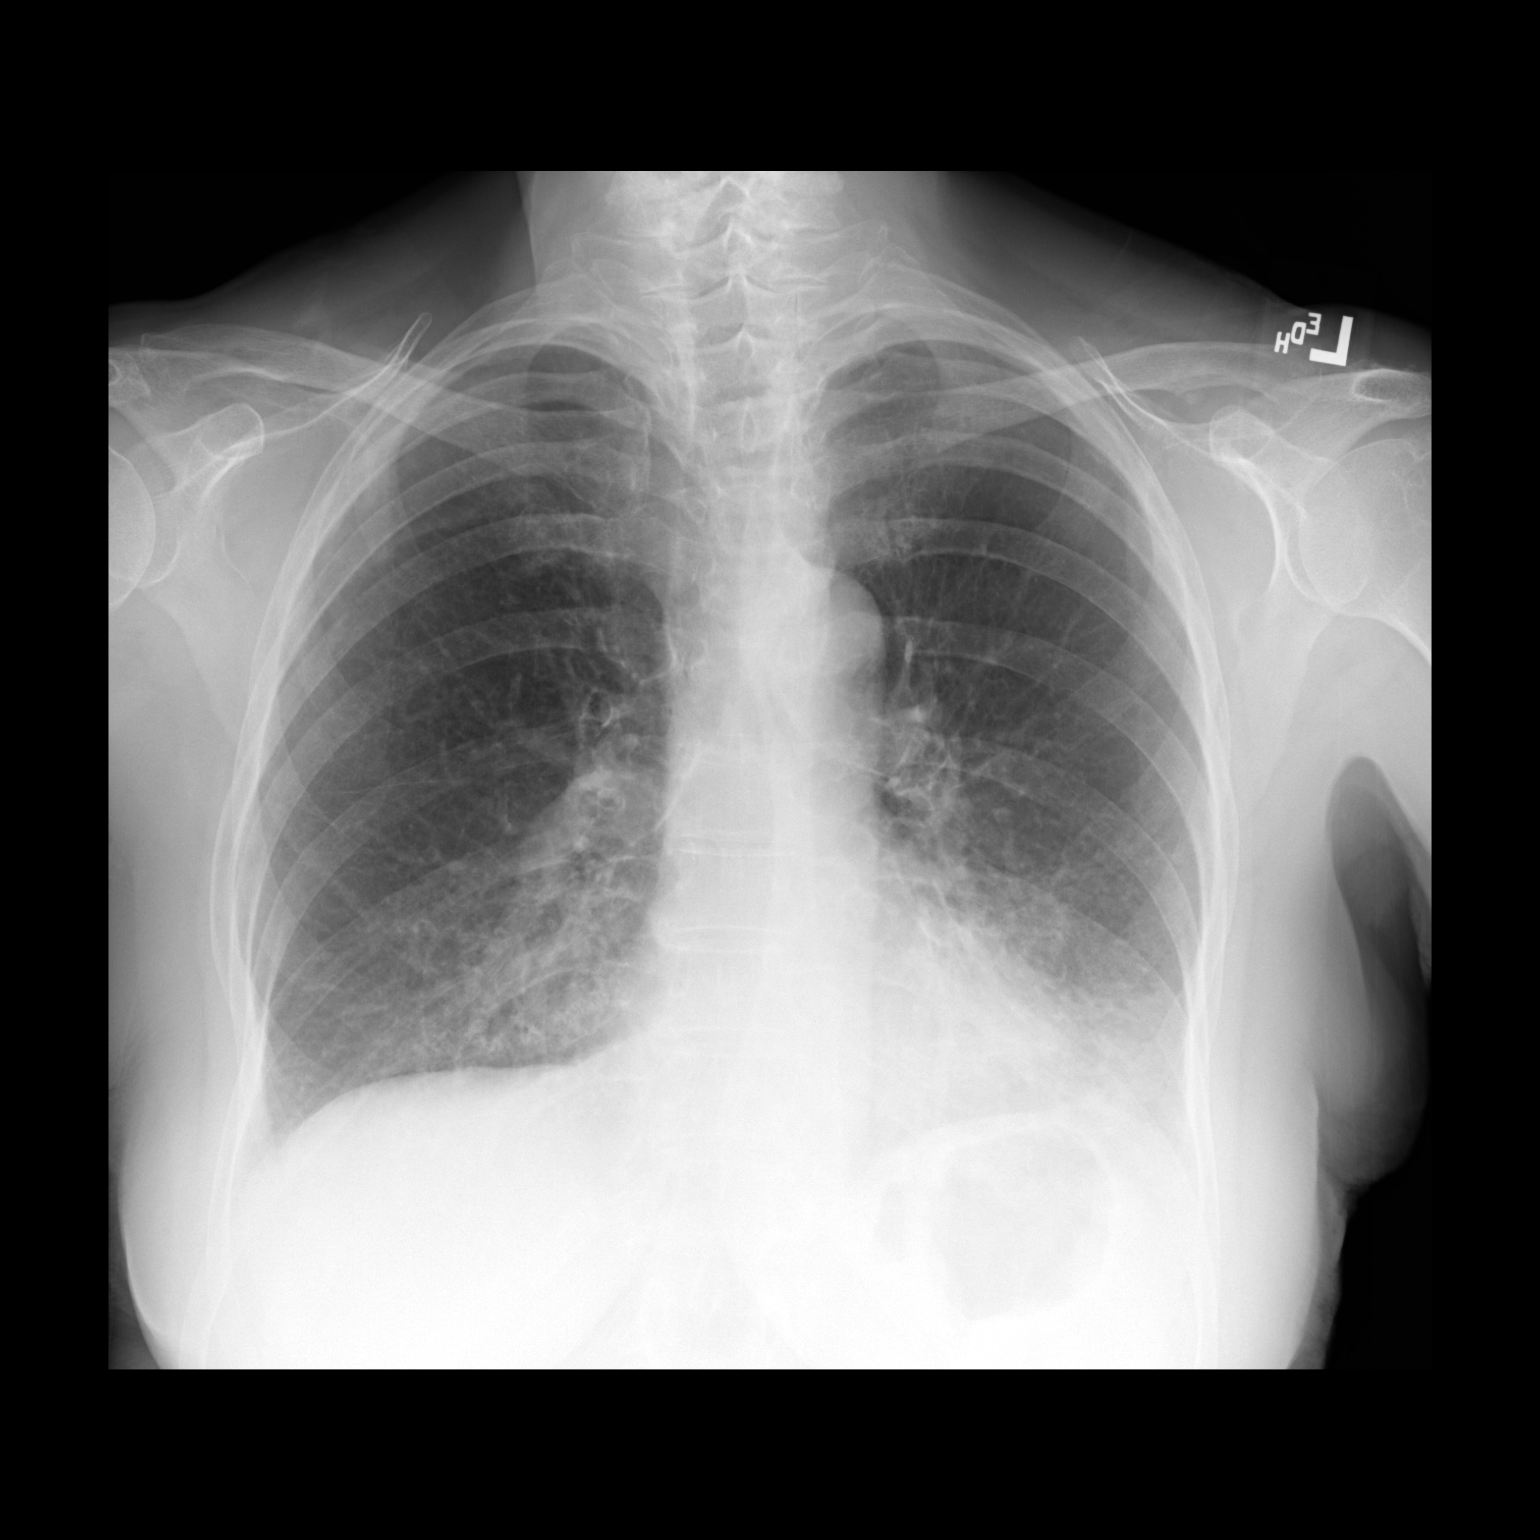

[dg chest 2 view (2 of 2)]
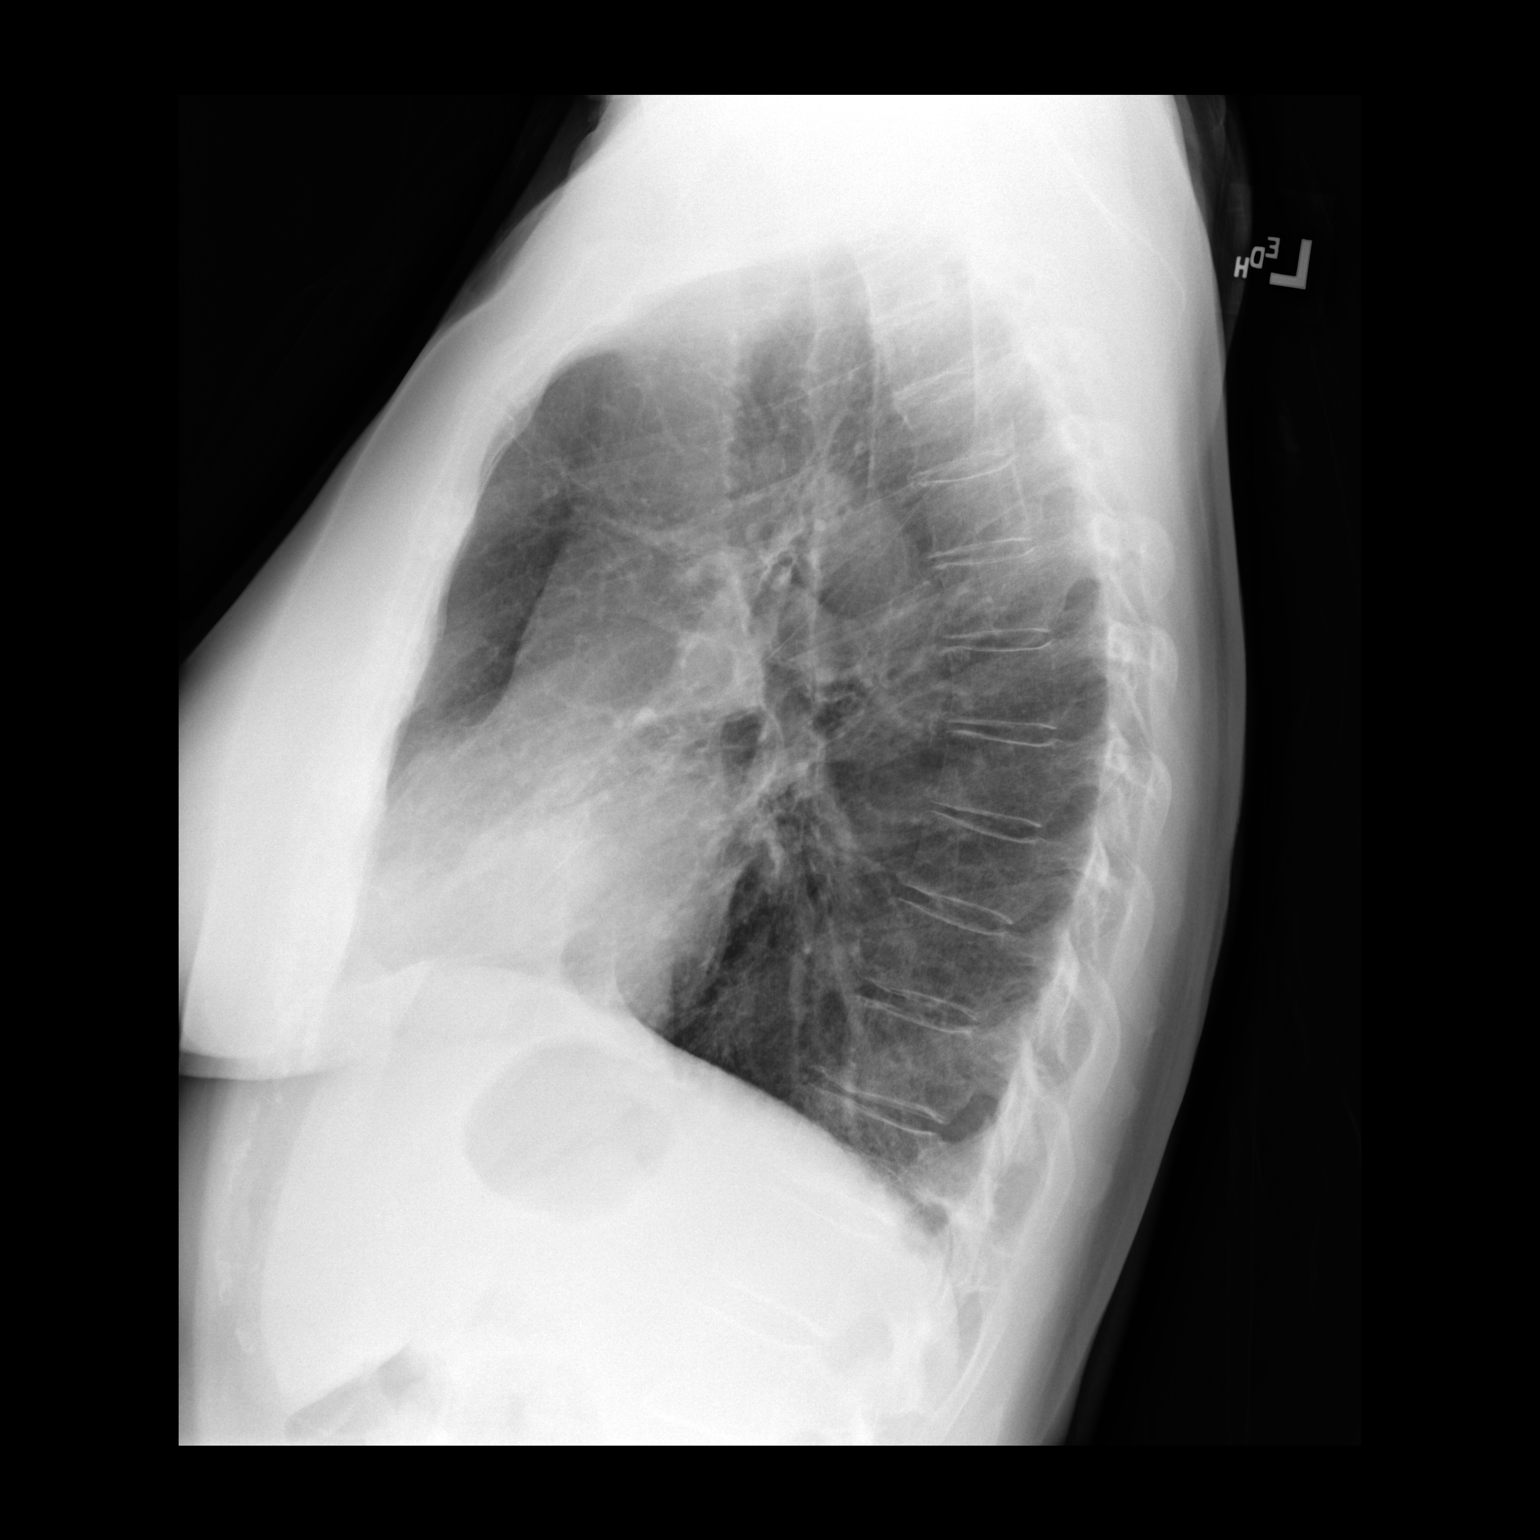

[2 of 2 positions shown; findings below may reference images not displayed]

FINDINGS: Mediastinum and hilar structures normal. Heart size normal.
Bibasilar pulmonary infiltrates/edema with small pleural effusions.
No pneumothorax.
IMPRESSION: Bibasilar pulmonary infiltrates. Findings consistent with bibasilar
pneumonia and/or edema. Small bilateral pleural effusions.

## 2018-06-17 IMAGING — DX DG CHEST 2V
2 series · 2 of 2 positions shown · non-contrast
Comparison: 08/14/2017

CLINICAL DATA: Cough and lower lobe pneumonia

EXAM:
CHEST - 2 VIEW

[dg chest 2 view (1 of 2)]
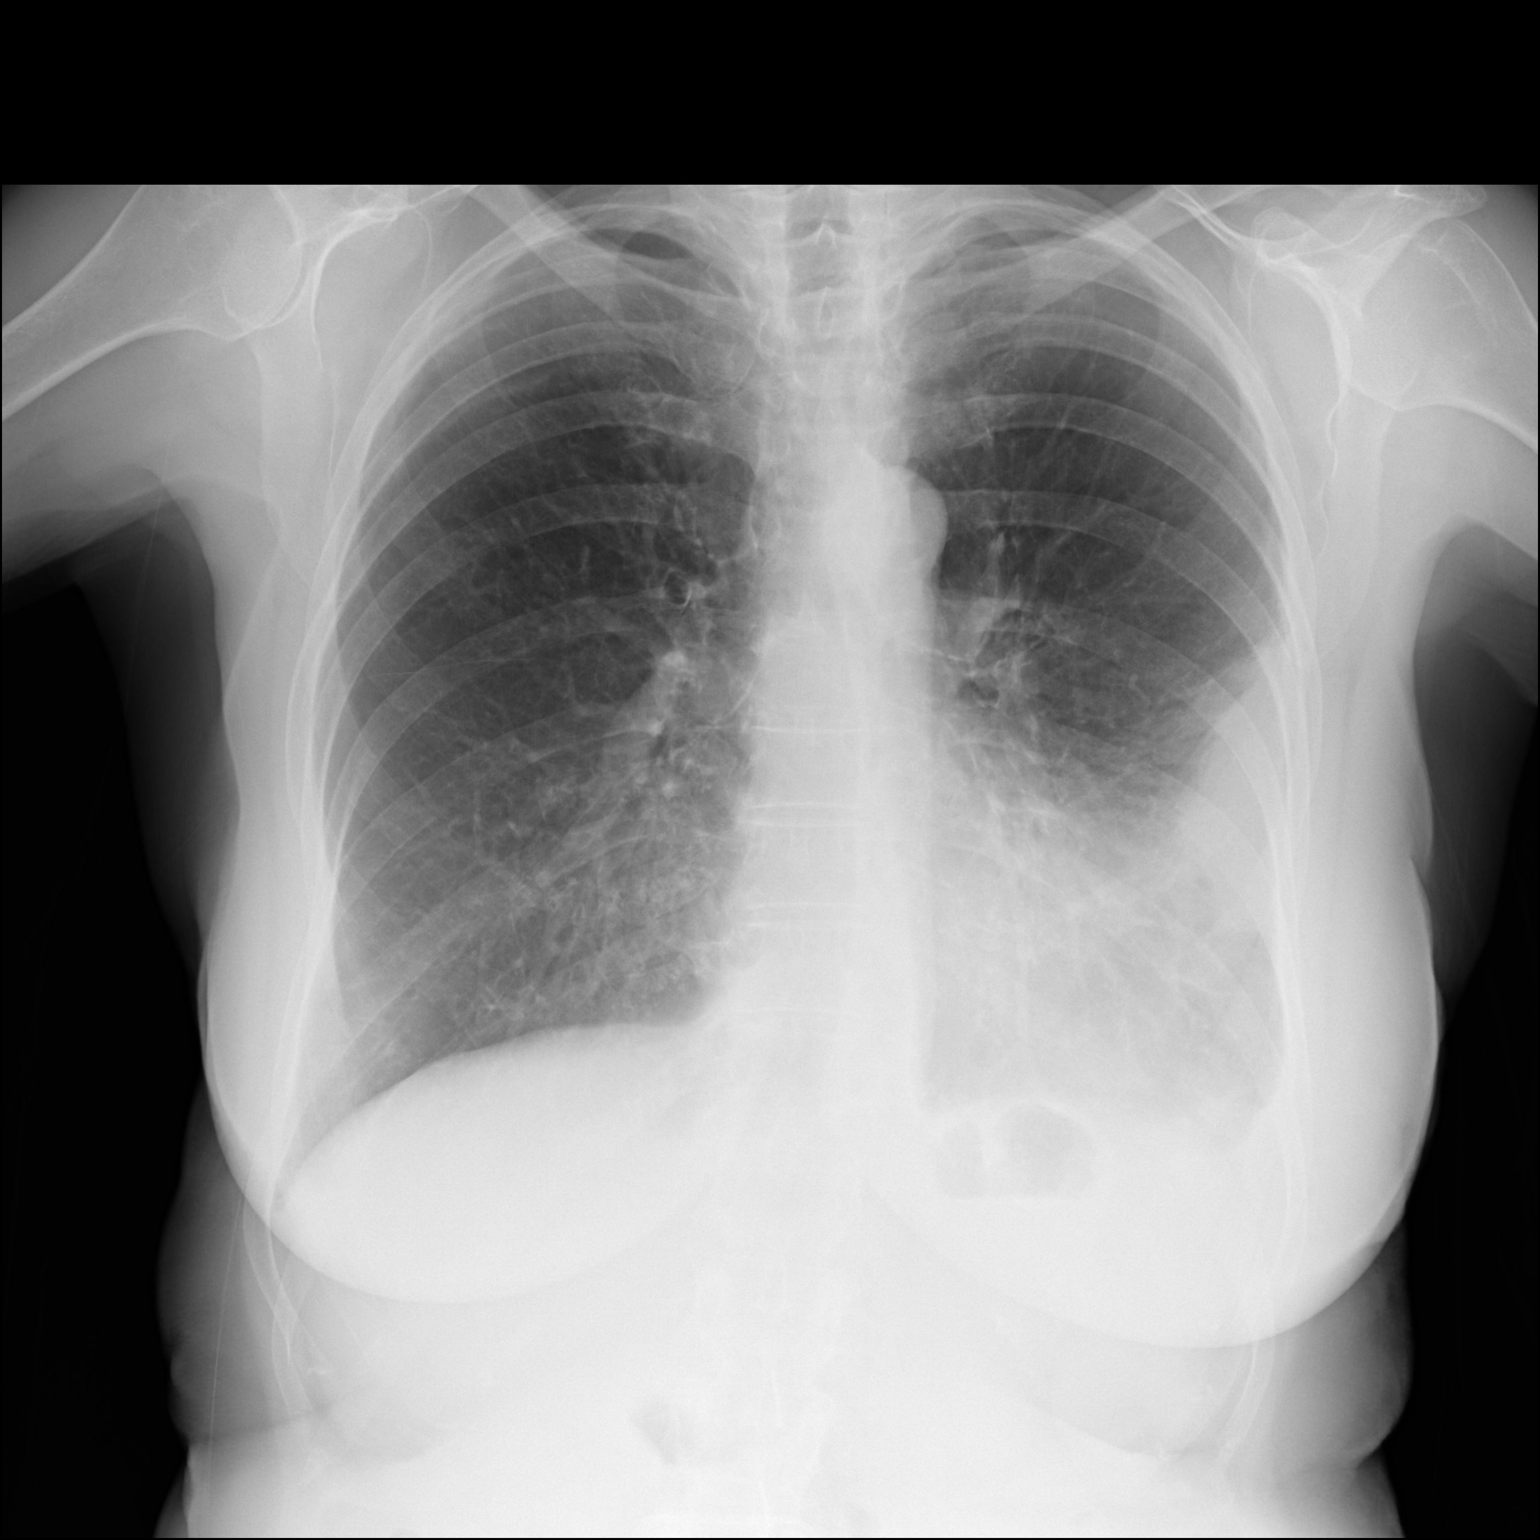

[dg chest 2 view (2 of 2)]
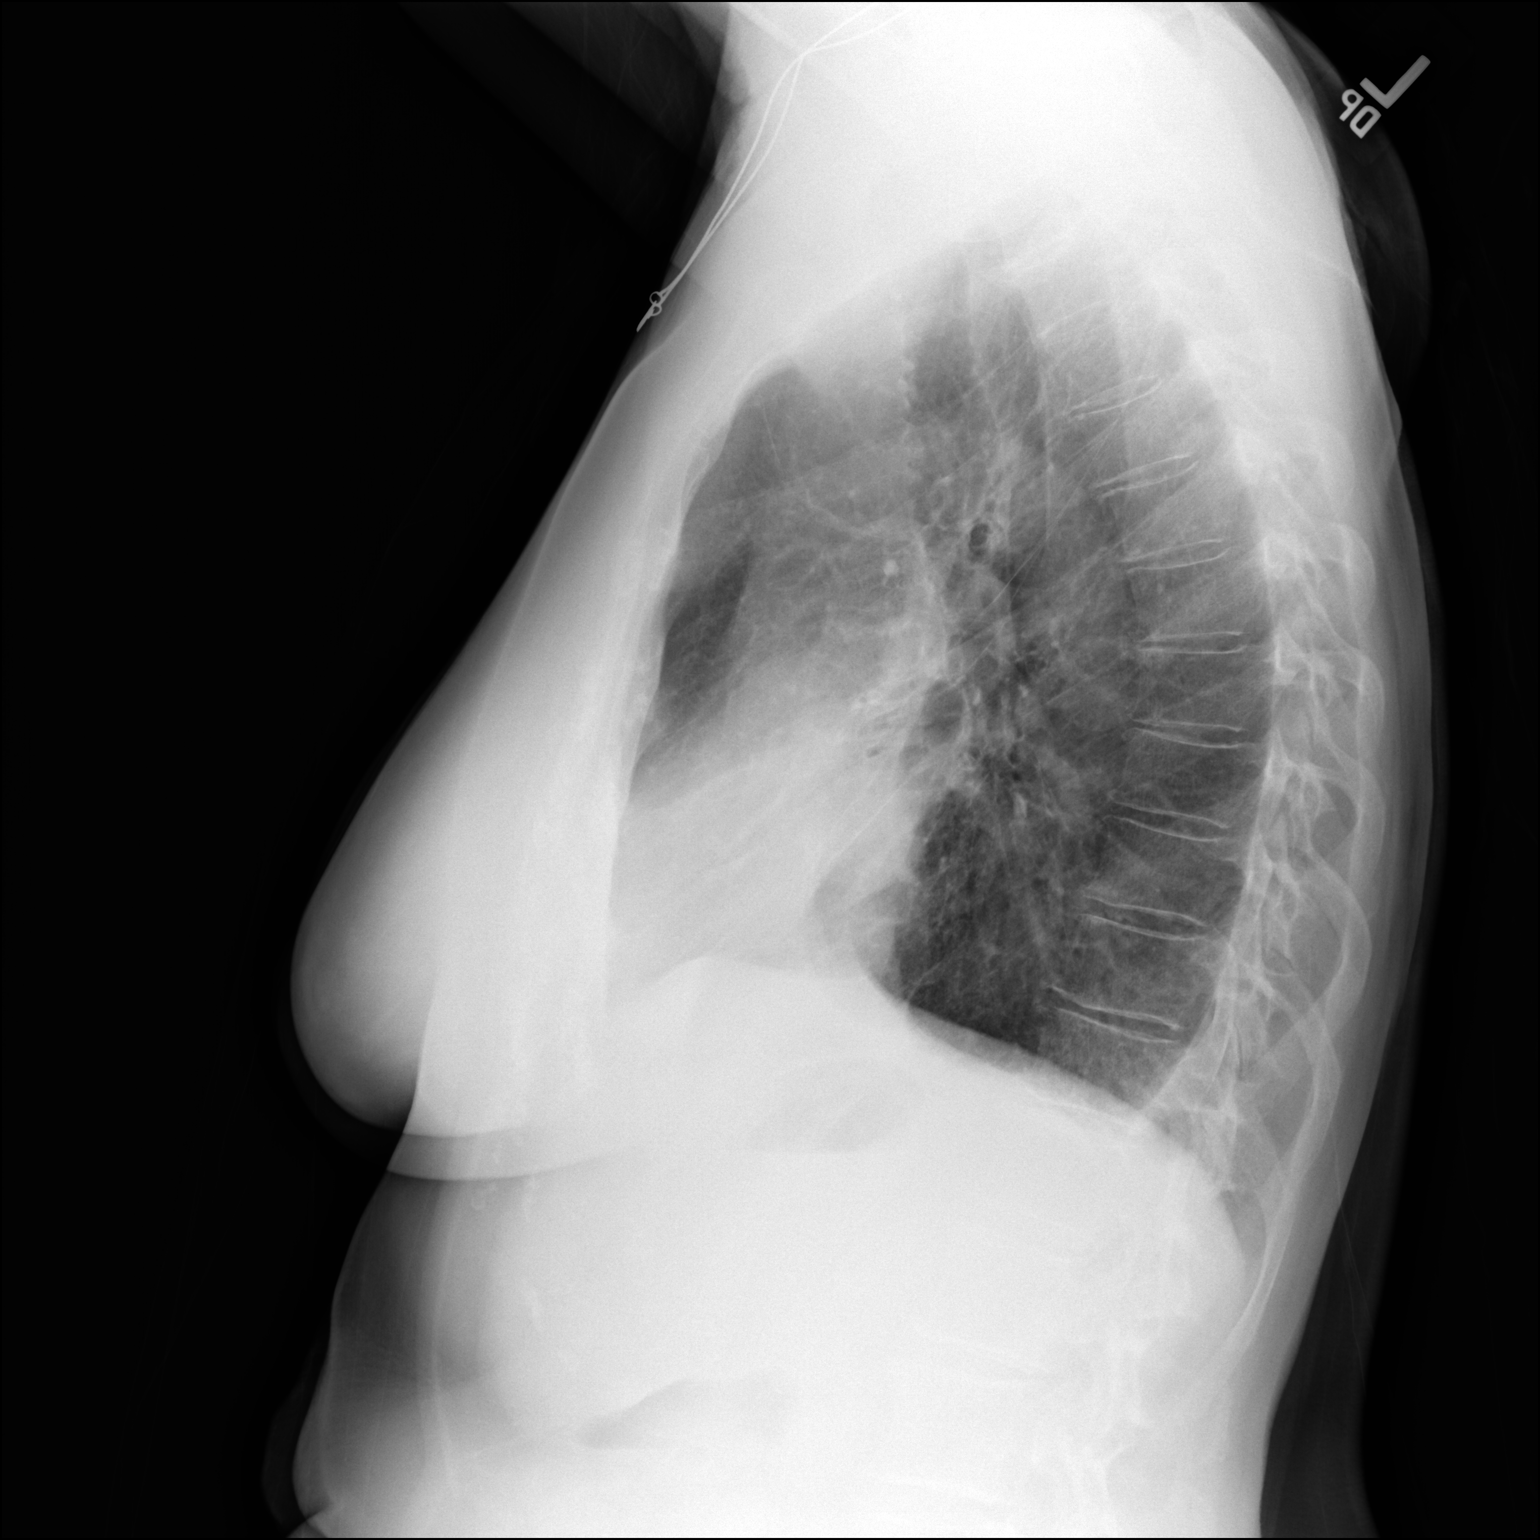

[2 of 2 positions shown; findings below may reference images not displayed]

FINDINGS: Cardiac shadow is stable. Right lung is now clear. Previously seen
infiltrate in the left base has shown clearing although new pleural
effusion is noted a portion of which appears loculated laterally. No
bony abnormality is noted.
IMPRESSION: Improved aeration in both lungs although a left pleural effusion is
noted as described.

## 2018-07-19 IMAGING — CR DG CHEST 2V
2 series · 2 of 2 positions shown · non-contrast
Comparison: 09/02/2017, 08/22/2017, 08/14/2017 and 10/25/2009 chest
x-ray chest x-ray.

CLINICAL DATA: 84-year-old female without complaints. Follow-up of
pleural effusion. Subsequent encounter.

EXAM:
CHEST - 2 VIEW

[w chest pa]
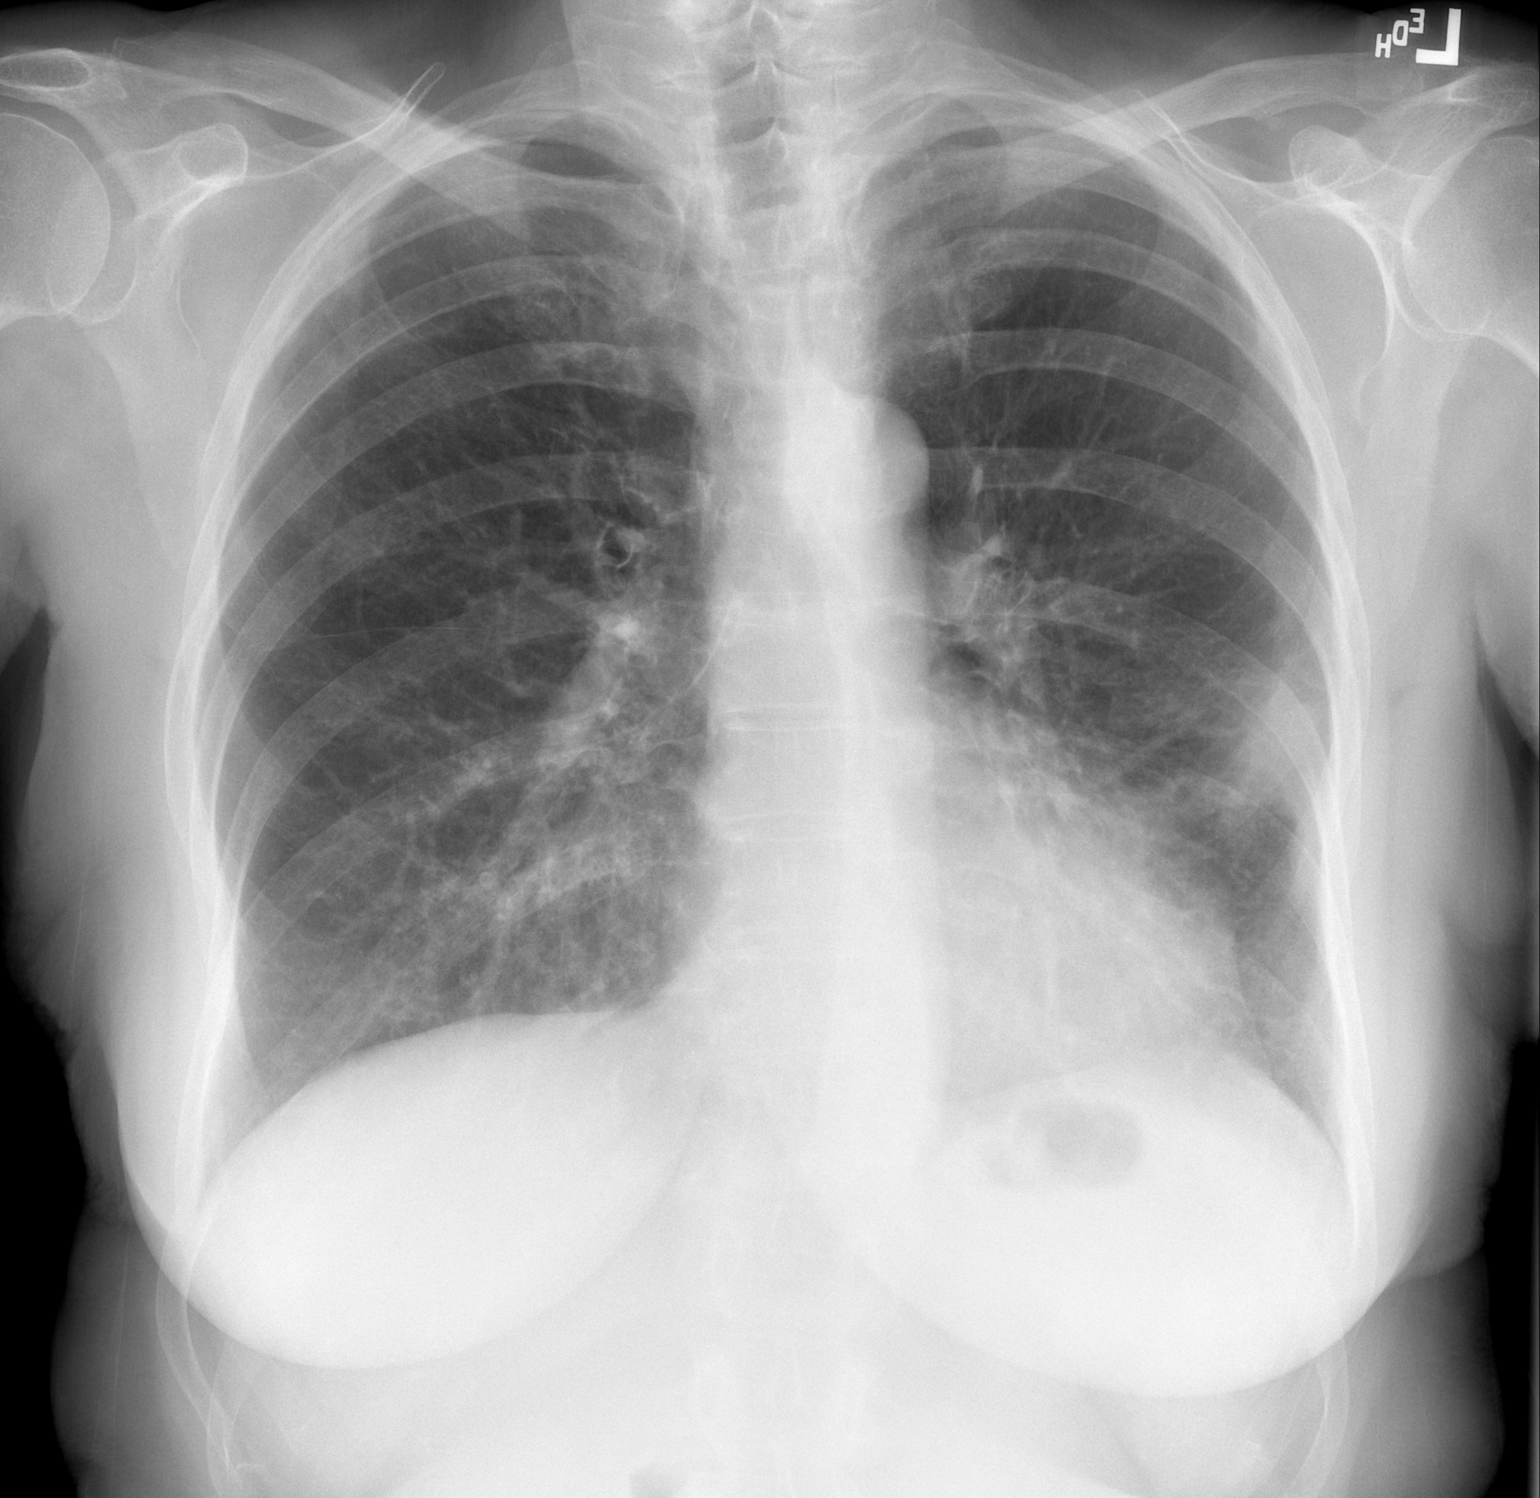

[w chest lat]
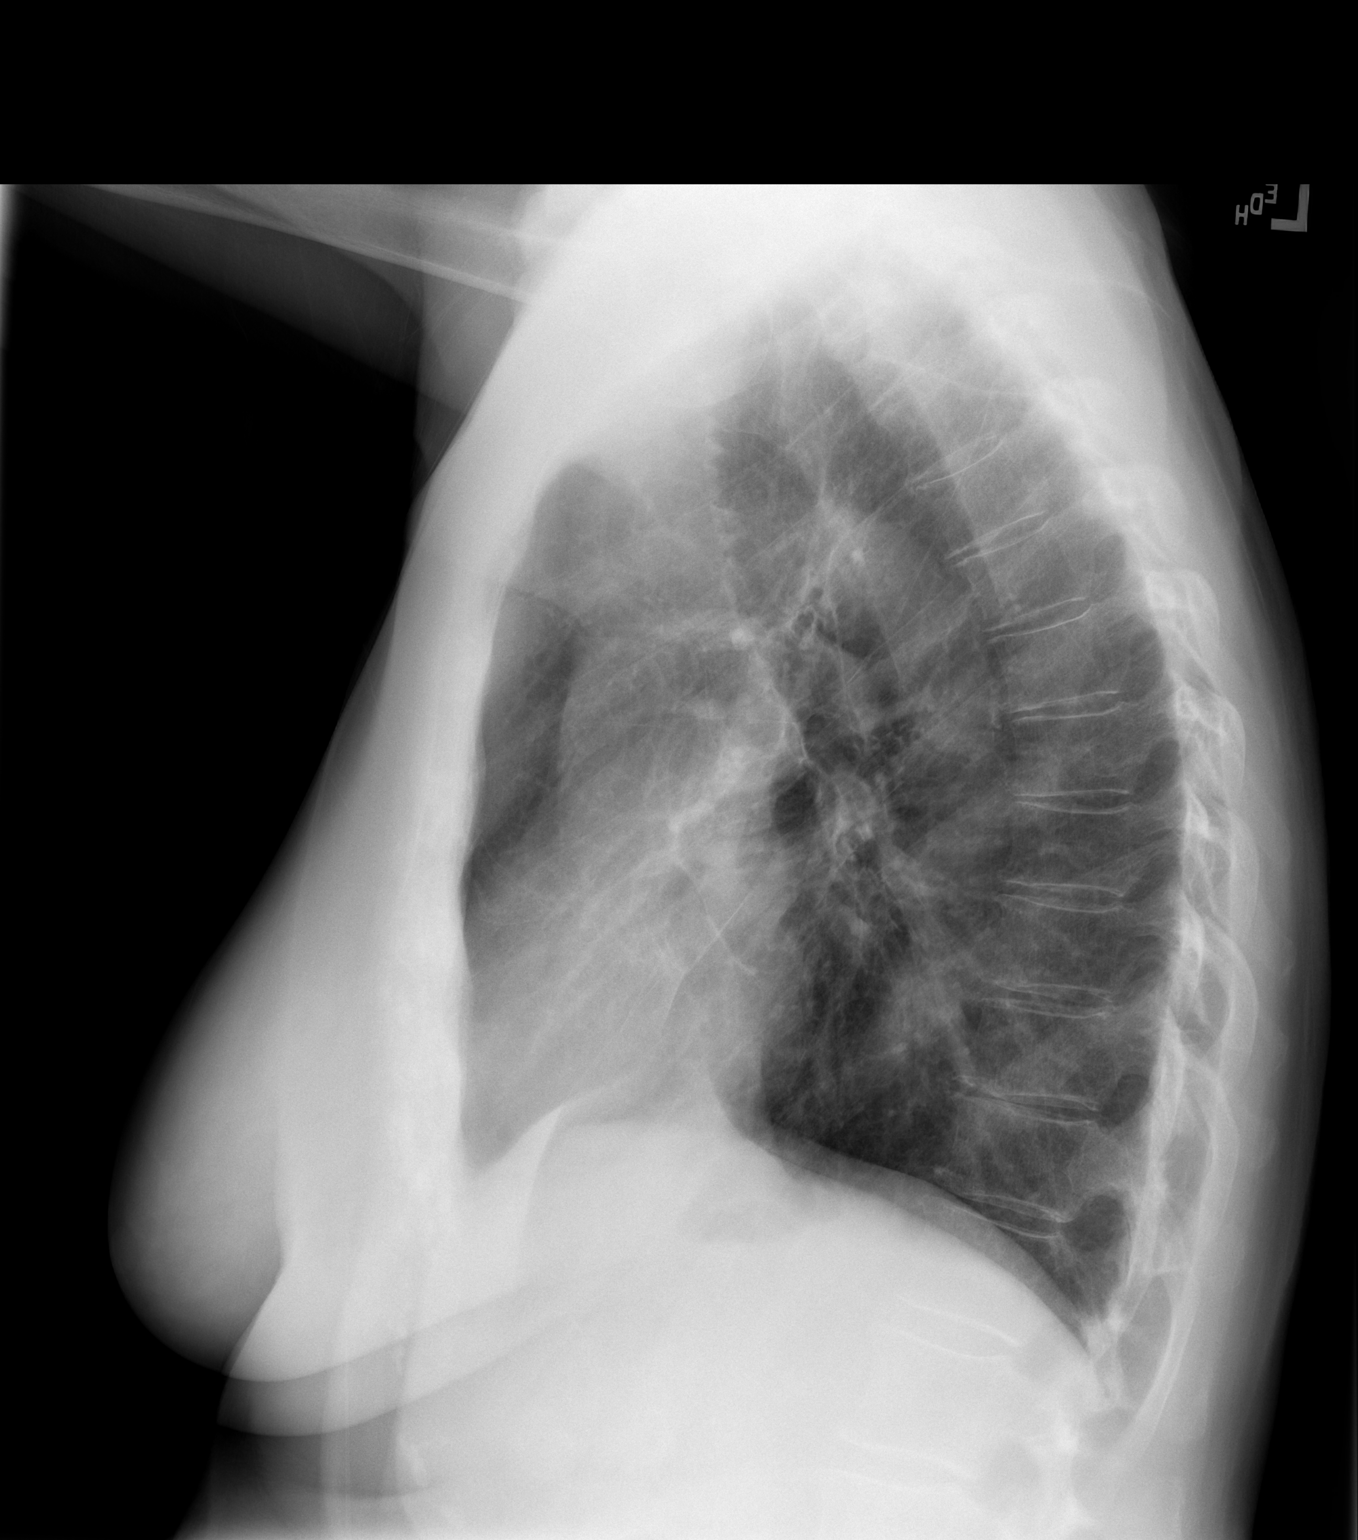

[2 of 2 positions shown; findings below may reference images not displayed]

FINDINGS: Peripheral based opacity with mid/lower left thorax is similar to
the 09/02/2017 exam although improved when compared to the
08/22/2017 exam. Appearance is suggestive of loculated pleural fluid
which may be progressing to scarring. Stability and/or clearing can
be confirmed on follow-up.

No infiltrate, congestive heart failure or pneumothorax.

Heart size within normal limits.

No acute osseous abnormality.
IMPRESSION: Peripheral based opacity with mid/lower left thorax is similar to
the 09/02/2017 exam although improved when compared to the
08/22/2017 exam. Appearance is suggestive of loculated pleural fluid
which may be progressing to scarring. Stability and/or clearing can
be confirmed on follow-up.

## 2018-07-21 DIAGNOSIS — N183 Chronic kidney disease, stage 3 (moderate): Secondary | ICD-10-CM | POA: Diagnosis not present

## 2018-07-21 DIAGNOSIS — I129 Hypertensive chronic kidney disease with stage 1 through stage 4 chronic kidney disease, or unspecified chronic kidney disease: Secondary | ICD-10-CM | POA: Diagnosis not present

## 2018-07-25 ENCOUNTER — Emergency Department (HOSPITAL_COMMUNITY): Payer: Medicare Other

## 2018-07-25 ENCOUNTER — Encounter (HOSPITAL_COMMUNITY): Payer: Self-pay

## 2018-07-25 ENCOUNTER — Other Ambulatory Visit: Payer: Self-pay

## 2018-07-25 ENCOUNTER — Emergency Department (HOSPITAL_COMMUNITY)
Admission: EM | Admit: 2018-07-25 | Discharge: 2018-07-25 | Disposition: A | Discharge status: Home / Self Care | Payer: Medicare Other | Attending: Emergency Medicine | Admitting: Emergency Medicine

## 2018-07-25 DIAGNOSIS — W19XXXA Unspecified fall, initial encounter: Secondary | ICD-10-CM | POA: Diagnosis not present

## 2018-07-25 DIAGNOSIS — W010XXA Fall on same level from slipping, tripping and stumbling without subsequent striking against object, initial encounter: Secondary | ICD-10-CM | POA: Insufficient documentation

## 2018-07-25 DIAGNOSIS — S52032A Displaced fracture of olecranon process with intraarticular extension of left ulna, initial encounter for closed fracture: Secondary | ICD-10-CM | POA: Diagnosis not present

## 2018-07-25 DIAGNOSIS — S52022A Displaced fracture of olecranon process without intraarticular extension of left ulna, initial encounter for closed fracture: Secondary | ICD-10-CM

## 2018-07-25 DIAGNOSIS — R609 Edema, unspecified: Secondary | ICD-10-CM | POA: Diagnosis not present

## 2018-07-25 DIAGNOSIS — Y92009 Unspecified place in unspecified non-institutional (private) residence as the place of occurrence of the external cause: Secondary | ICD-10-CM | POA: Diagnosis not present

## 2018-07-25 DIAGNOSIS — Y999 Unspecified external cause status: Secondary | ICD-10-CM | POA: Insufficient documentation

## 2018-07-25 DIAGNOSIS — Y939 Activity, unspecified: Secondary | ICD-10-CM | POA: Diagnosis not present

## 2018-07-25 DIAGNOSIS — S59902A Unspecified injury of left elbow, initial encounter: Secondary | ICD-10-CM | POA: Diagnosis present

## 2018-07-25 DIAGNOSIS — M79602 Pain in left arm: Secondary | ICD-10-CM | POA: Diagnosis not present

## 2018-07-25 DIAGNOSIS — I10 Essential (primary) hypertension: Secondary | ICD-10-CM | POA: Insufficient documentation

## 2018-07-25 DIAGNOSIS — S4992XA Unspecified injury of left shoulder and upper arm, initial encounter: Secondary | ICD-10-CM | POA: Diagnosis not present

## 2018-07-25 DIAGNOSIS — Z96652 Presence of left artificial knee joint: Secondary | ICD-10-CM | POA: Insufficient documentation

## 2018-07-25 DIAGNOSIS — Z79899 Other long term (current) drug therapy: Secondary | ICD-10-CM | POA: Insufficient documentation

## 2018-07-25 MED ORDER — OXYCODONE HCL 5 MG PO TABS: 5.0000 mg | ORAL_TABLET | Freq: Four times a day (QID) | ORAL | 0 refills | Status: DC | PRN

## 2018-07-25 NOTE — ED Triage Notes (Signed)
Pt arrived via EMS from home . Pt tripped in her home today no loc, or head injury. Pt fell on to left arm, area is swollen and bruised.  Pt was able to straighten out arm with pain .

## 2018-07-25 NOTE — ED Notes (Signed)
Dressed and cleansed wound applied xeroform, and rolled gauze. Ortho tech applied soft spint and sling. Pt tolerated well. Pt spouse called to pick patient up.

## 2018-07-25 NOTE — Discharge Instructions (Addendum)
You will be contacted by Dr. Carollee Massed office on Monday for follow-up instructions likely Tuesday

## 2018-07-25 NOTE — ED Notes (Signed)
Bed: WA04 Expected date:  Expected time:  Means of arrival:  Comments: Fall 

## 2018-07-25 NOTE — ED Provider Notes (Signed)
[facility]-EMERGENCY DEPT Provider Note   CSN: [id number] Arrival date & time: 07/25/18  1357    History   Chief Complaint Chief Complaint  Patient presents with  . Fall  . Elbow Pain    HPI Shannon Mcintyre is a 85 y.o. female.     HPI Patient is an 85 year old female who presents the emergency department after trip and fall today at her house.  No head injury or loss consciousness.  She fell onto her left elbow.  She can still range her left elbow but states some pain in her left elbow as well as pain in her left shoulder.  Denies back pain.  No hip pain.  No other complaints.  She reports that she tripped.   Past Medical History:  Diagnosis Date  . Arthritis   . Diverticulosis of colon   . Eczema   . Hypertension   . Primary localized osteoarthritis of left knee     Patient Active Problem List   Diagnosis Date Noted  . Primary localized osteoarthritis of left knee   . Hyperlipidemia 11/12/2011  . Eczema 11/05/2010  . Essential hypertension 08/01/2009  . ARTHRITIS 03/29/2008  . DIVERTICULOSIS, COLON 09/17/2006    Past Surgical History:  Procedure Laterality Date  . ABDOMINAL HYSTERECTOMY  1980   BSO  . APPENDECTOMY  1980  . knee arthoscopy  2010   right  . KNEE ARTHROSCOPY  2001   left  . TOTAL KNEE ARTHROPLASTY Left 08/12/2016   Procedure: LEFT TOTAL KNEE ARTHROPLASTY;  Surgeon: Salvatore Marvel, MD;  Location: [facility] OR;  Service: Orthopedics;  Laterality: Left;     OB History   No obstetric history on file.      Home Medications    Prior to Admission medications   Medication Sig Start Date End Date Taking? Authorizing Provider  aspirin EC 325 MG EC tablet 1 tab a day for the next 30 days to prevent blood clots 08/15/16   Shepperson, Kirstin, PA-C  cephALEXin (KEFLEX) 500 MG capsule Take 1 capsule (500 mg total) by mouth 4 (four) times daily. 08/15/16   Shepperson, Kirstin, PA-C  docusate sodium (COLACE) 100 MG capsule 1 tab 2  times a day while on narcotics.  STOOL SOFTENER 08/15/16   Shepperson, Kirstin, PA-C  folic acid (FOLVITE) 1 MG tablet Take 1 mg by mouth daily.    [provider]  hydrOXYzine (ATARAX/VISTARIL) 25 MG tablet Take 1 tablet (25 mg total) by mouth every 4 (four) hours as needed for itching. 08/15/16   Shepperson, Kirstin, PA-C  losartan (COZAAR) 25 MG tablet Take 25 mg by mouth daily.    [provider]  methotrexate (RHEUMATREX) 2.5 MG tablet Take 5-7.5 mg by mouth 3 (three) times a week. Caution:Chemotherapy. Protect from light.  Tuesday 7.5 mg in the am and 5mg  in the am and Wednesday 7.5 mg in the am    [provider]  oxyCODONE (OXY IR/ROXICODONE) 5 MG immediate release tablet Take 1-2 tablets (5-10 mg total) by mouth every 3 (three) hours as needed for breakthrough pain. 08/15/16   Shepperson, Kirstin, PA-C  VESICARE 5 MG tablet TAKE 1 TABLET BY MOUTH DAILY. 11/10/13   Swords, Valetta Mole, MD    Family History Family History  Problem Relation Age of Onset  . Pneumonia Mother 34  . Heart attack Father 55       Possibly CAD starting in his 23s  . Parkinson's disease Brother     Social History  Social History   Tobacco Use  . Smoking status: Never Smoker  . Smokeless tobacco: Never Used  Substance Use Topics  . Alcohol use: No  . Drug use: No     Allergies   No known allergies   Review of Systems Review of Systems  All other systems reviewed and are negative.    Physical Exam Updated Vital Signs BP (!) 156/65 (BP Location: Right Arm)   Pulse 74   Temp 98.2 F (36.8 C) (Oral)   Resp 18   SpO2 98%   Physical Exam Vitals signs and nursing note reviewed.  Constitutional:      Appearance: She is well-developed.  HENT:     Head: Normocephalic.  Neck:     Musculoskeletal: Normal range of motion.  Pulmonary:     Effort: Pulmonary effort is normal.  Abdominal:     General: There is no distension.  Musculoskeletal:     Comments: Full range of  motion of bilateral hips, knees, ankles.  Full range of motion of bilateral shoulders and wrists.  Mild pain with range of motion of the left elbow but able to fully range.  Large posterior left elbow hematoma without laceration.  Mild tenderness to the left AC joint without obvious deformity.  No tenderness over the left clavicle.  Full range of motion of the right elbow.  Neurological:     Mental Status: She is alert and oriented to person, place, and time.      ED Treatments / Results  Labs (all labs ordered are listed, but only abnormal results are displayed) Labs Reviewed - No data to display  EKG None  Radiology Dg Elbow Complete Left  Result Date: 07/25/2018 CLINICAL DATA:  Fall with bruising EXAM: LEFT ELBOW - COMPLETE 3+ VIEW COMPARISON:  None. FINDINGS: Acute mildly comminuted and distracted fracture involving the olecranon of the proximal ulna. Main fracture fragments separated by approximately 6 mm. Normal radial head alignment. Large elbow effusion. IMPRESSION: Acute comminuted and distracted intra-articular fracture involving the olecranon of the ulna. Associated elbow effusion Electronically Signed   By: Jasmine PangKim  Fujinaga M.D.   On: 07/25/2018 15:04   Dg Shoulder Left  Result Date: 07/25/2018 CLINICAL DATA:  Fall with arm pain EXAM: LEFT SHOULDER - 2+ VIEW COMPARISON:  None. FINDINGS: No fracture or malalignment. Moderate AC joint degenerative change. Small amount of calcific tendinitis IMPRESSION: 1. No acute osseous abnormality. 2. Mild calcific tendinitis Electronically Signed   By: Jasmine PangKim  Fujinaga M.D.   On: 07/25/2018 15:05    Procedures .Splint Application Performed by: Azalia Bilisampos, Antonio Creswell, MD Authorized by: Azalia Bilisampos, Astin Rape, MD    SPLINT APPLICATION Authorized by: Azalia BilisKevin Janathan Bribiesca Consent: Verbal consent obtained. Risks and benefits: risks, benefits and alternatives were discussed Consent given by: patient Splint applied by: orthopedic technician Location details: left UE  Splint type: posterior splint Supplies used: orthoglass Post-procedure: The splinted body part was neurovascularly unchanged following the procedure. Patient tolerance: Patient tolerated the procedure well with no immediate complications.     Medications Ordered in ED Medications - No data to display   Initial Impression / Assessment and Plan / ED Course  I have reviewed the triage vital signs and the nursing notes.  Pertinent labs & imaging results that were available during my care of the patient were reviewed by me and considered in my medical decision making (see chart for details).       Comminuted olecranon fracture left elbow.  Nothing in the left shoulder.  Case will  be discussed with on-call upper extremity specialist Dr. Janee Morn.  4:14 PM Case discussed with Dr Janee Morn. He will see her in the office Tuesday. Agrees with splinting. Updated her husband with the findings and follow up plans  Final Clinical Impressions(s) / ED Diagnoses   Final diagnoses:  Olecranon fracture, left, closed, initial encounter    ED Discharge Orders    None       Azalia Bilis, MD 07/25/18 [phone]

## 2018-07-27 ENCOUNTER — Other Ambulatory Visit: Payer: Self-pay | Admitting: Orthopedic Surgery

## 2018-07-27 ENCOUNTER — Encounter (HOSPITAL_BASED_OUTPATIENT_CLINIC_OR_DEPARTMENT_OTHER): Payer: Self-pay | Admitting: *Deleted

## 2018-07-27 ENCOUNTER — Other Ambulatory Visit: Payer: Self-pay

## 2018-07-27 DIAGNOSIS — S52025A Nondisplaced fracture of olecranon process without intraarticular extension of left ulna, initial encounter for closed fracture: Secondary | ICD-10-CM | POA: Diagnosis not present

## 2018-07-28 ENCOUNTER — Ambulatory Visit (HOSPITAL_BASED_OUTPATIENT_CLINIC_OR_DEPARTMENT_OTHER)
Admission: RE | Admit: 2018-07-28 | Discharge: 2018-07-28 | Disposition: A | Discharge status: Home / Self Care | Payer: Medicare Other | Attending: Orthopedic Surgery | Admitting: Orthopedic Surgery

## 2018-07-28 ENCOUNTER — Encounter (HOSPITAL_BASED_OUTPATIENT_CLINIC_OR_DEPARTMENT_OTHER): Payer: Self-pay | Admitting: *Deleted

## 2018-07-28 ENCOUNTER — Ambulatory Visit (HOSPITAL_BASED_OUTPATIENT_CLINIC_OR_DEPARTMENT_OTHER): Payer: Medicare Other | Admitting: Certified Registered"

## 2018-07-28 ENCOUNTER — Ambulatory Visit (HOSPITAL_BASED_OUTPATIENT_CLINIC_OR_DEPARTMENT_OTHER): Admission: RE | Admit: 2018-07-28 | Payer: Medicare Other | Source: Home / Self Care | Admitting: Orthopedic Surgery

## 2018-07-28 ENCOUNTER — Encounter (HOSPITAL_BASED_OUTPATIENT_CLINIC_OR_DEPARTMENT_OTHER)
Admission: RE | Disposition: A | Discharge status: Home / Self Care | Payer: Self-pay | Source: Home / Self Care | Attending: Orthopedic Surgery

## 2018-07-28 ENCOUNTER — Encounter (HOSPITAL_BASED_OUTPATIENT_CLINIC_OR_DEPARTMENT_OTHER): Admission: RE | Payer: Self-pay | Source: Home / Self Care

## 2018-07-28 ENCOUNTER — Other Ambulatory Visit: Payer: Self-pay

## 2018-07-28 DIAGNOSIS — W19XXXA Unspecified fall, initial encounter: Secondary | ICD-10-CM | POA: Insufficient documentation

## 2018-07-28 DIAGNOSIS — S52022A Displaced fracture of olecranon process without intraarticular extension of left ulna, initial encounter for closed fracture: Secondary | ICD-10-CM | POA: Diagnosis not present

## 2018-07-28 DIAGNOSIS — E785 Hyperlipidemia, unspecified: Secondary | ICD-10-CM | POA: Diagnosis not present

## 2018-07-28 DIAGNOSIS — Z96652 Presence of left artificial knee joint: Secondary | ICD-10-CM | POA: Diagnosis not present

## 2018-07-28 DIAGNOSIS — S43015A Anterior dislocation of left humerus, initial encounter: Secondary | ICD-10-CM | POA: Diagnosis not present

## 2018-07-28 DIAGNOSIS — Z79899 Other long term (current) drug therapy: Secondary | ICD-10-CM | POA: Diagnosis not present

## 2018-07-28 DIAGNOSIS — I10 Essential (primary) hypertension: Secondary | ICD-10-CM | POA: Diagnosis not present

## 2018-07-28 DIAGNOSIS — G8918 Other acute postprocedural pain: Secondary | ICD-10-CM | POA: Diagnosis not present

## 2018-07-28 HISTORY — DX: Displaced fracture of olecranon process without intraarticular extension of left ulna, initial encounter for closed fracture: S52.022A

## 2018-07-28 HISTORY — DX: Unspecified fracture of lower end of left humerus, initial encounter for closed fracture: S42.402A

## 2018-07-28 HISTORY — PX: ORIF ELBOW FRACTURE: SHX5031

## 2018-07-28 SURGERY — OPEN REDUCTION INTERNAL FIXATION (ORIF) ELBOW/OLECRANON FRACTURE
Anesthesia: Choice | Laterality: Left

## 2018-07-28 SURGERY — OPEN REDUCTION INTERNAL FIXATION (ORIF) ELBOW/OLECRANON FRACTURE
Anesthesia: Monitor Anesthesia Care | Site: Elbow | Laterality: Left

## 2018-07-28 MED ORDER — LIDOCAINE HCL (CARDIAC) PF 100 MG/5ML IV SOSY
PREFILLED_SYRINGE | INTRAVENOUS | Status: DC | PRN
  Administered 2018-07-28: 80 mg via INTRAVENOUS

## 2018-07-28 MED ORDER — HYDROMORPHONE HCL 1 MG/ML IJ SOLN: 0.2500 mg | INTRAMUSCULAR | Status: DC | PRN

## 2018-07-28 MED ORDER — MIDAZOLAM HCL 2 MG/2ML IJ SOLN
INTRAMUSCULAR | Status: AC
  Filled 2018-07-28: qty 2

## 2018-07-28 MED ORDER — ROCURONIUM BROMIDE 100 MG/10ML IV SOLN
INTRAVENOUS | Status: DC | PRN
  Administered 2018-07-28: 30 mg via INTRAVENOUS

## 2018-07-28 MED ORDER — MIDAZOLAM HCL 2 MG/2ML IJ SOLN
1.0000 mg | INTRAMUSCULAR | Status: DC | PRN
  Administered 2018-07-28: 11:00:00 1 mg via INTRAVENOUS

## 2018-07-28 MED ORDER — ONDANSETRON HCL 4 MG/2ML IJ SOLN
INTRAMUSCULAR | Status: DC | PRN
  Administered 2018-07-28: 4 mg via INTRAVENOUS

## 2018-07-28 MED ORDER — SCOPOLAMINE 1 MG/3DAYS TD PT72: 1.0000 | MEDICATED_PATCH | Freq: Once | TRANSDERMAL | Status: DC | PRN

## 2018-07-28 MED ORDER — FENTANYL CITRATE (PF) 100 MCG/2ML IJ SOLN
INTRAMUSCULAR | Status: AC
  Filled 2018-07-28: qty 2

## 2018-07-28 MED ORDER — OXYCODONE HCL 5 MG/5ML PO SOLN: 5.0000 mg | Freq: Once | ORAL | Status: DC | PRN

## 2018-07-28 MED ORDER — PROMETHAZINE HCL 25 MG/ML IJ SOLN: 6.2500 mg | INTRAMUSCULAR | Status: DC | PRN

## 2018-07-28 MED ORDER — DEXAMETHASONE SODIUM PHOSPHATE 4 MG/ML IJ SOLN
INTRAMUSCULAR | Status: DC | PRN
  Administered 2018-07-28: 10 mg via INTRAVENOUS

## 2018-07-28 MED ORDER — SUCCINYLCHOLINE CHLORIDE 20 MG/ML IJ SOLN
INTRAMUSCULAR | Status: DC | PRN
  Administered 2018-07-28: 120 mg via INTRAVENOUS

## 2018-07-28 MED ORDER — SUGAMMADEX SODIUM 200 MG/2ML IV SOLN
INTRAVENOUS | Status: DC | PRN
  Administered 2018-07-28: 200 mg via INTRAVENOUS

## 2018-07-28 MED ORDER — FENTANYL CITRATE (PF) 100 MCG/2ML IJ SOLN
50.0000 ug | INTRAMUSCULAR | Status: DC | PRN
  Administered 2018-07-28: 11:00:00 50 ug via INTRAVENOUS

## 2018-07-28 MED ORDER — LACTATED RINGERS IV SOLN
INTRAVENOUS | Status: DC
  Administered 2018-07-28: 10:00:00 via INTRAVENOUS

## 2018-07-28 MED ORDER — CHLORHEXIDINE GLUCONATE 4 % EX LIQD: 60.0000 mL | Freq: Once | CUTANEOUS | Status: DC

## 2018-07-28 MED ORDER — PHENYLEPHRINE HCL (PRESSORS) 10 MG/ML IV SOLN
INTRAVENOUS | Status: DC | PRN
  Administered 2018-07-28 (×3): 80 ug via INTRAVENOUS
  Administered 2018-07-28: 120 ug via INTRAVENOUS
  Administered 2018-07-28 (×2): 80 ug via INTRAVENOUS
  Administered 2018-07-28: 120 ug via INTRAVENOUS
  Administered 2018-07-28: 40 ug via INTRAVENOUS

## 2018-07-28 MED ORDER — CEFAZOLIN SODIUM-DEXTROSE 2-4 GM/100ML-% IV SOLN
2.0000 g | INTRAVENOUS | Status: AC
  Administered 2018-07-28: 2 g via INTRAVENOUS

## 2018-07-28 MED ORDER — ROPIVACAINE HCL 5 MG/ML IJ SOLN
INTRAMUSCULAR | Status: DC | PRN
  Administered 2018-07-28: 30 mL via PERINEURAL

## 2018-07-28 MED ORDER — OXYCODONE HCL 5 MG PO TABS: 5.0000 mg | ORAL_TABLET | Freq: Once | ORAL | Status: DC | PRN

## 2018-07-28 MED ORDER — PROPOFOL 10 MG/ML IV BOLUS
INTRAVENOUS | Status: DC | PRN
  Administered 2018-07-28: 100 mg via INTRAVENOUS

## 2018-07-28 SURGICAL SUPPLY — 87 items
APL PRP STRL LF DISP 70% ISPRP (MISCELLANEOUS) ×1
BANDAGE ACE 3X5.8 VEL STRL LF (GAUZE/BANDAGES/DRESSINGS) IMPLANT
BANDAGE ACE 4X5 VEL STRL LF (GAUZE/BANDAGES/DRESSINGS) ×3 IMPLANT
BLADE AVERAGE 25MMX9MM (BLADE)
BLADE AVERAGE 25X9 (BLADE) IMPLANT
BLADE MINI RND TIP GREEN BEAV (BLADE) IMPLANT
BLADE SURG 15 STRL LF DISP TIS (BLADE) ×1 IMPLANT
BLADE SURG 15 STRL SS (BLADE) ×3
BNDG CMPR 9X4 STRL LF SNTH (GAUZE/BANDAGES/DRESSINGS) ×1
BNDG COHESIVE 4X5 TAN STRL (GAUZE/BANDAGES/DRESSINGS) ×2 IMPLANT
BNDG ELASTIC 2X5.8 VLCR STR LF (GAUZE/BANDAGES/DRESSINGS) IMPLANT
BNDG ESMARK 4X9 LF (GAUZE/BANDAGES/DRESSINGS) ×3 IMPLANT
CABLE CERCLAGE W/NDL GRIP (Cable) ×2 IMPLANT
CANISTER SUCT 1200ML W/VALVE (MISCELLANEOUS) ×3 IMPLANT
CHLORAPREP W/TINT 26 (MISCELLANEOUS) ×2 IMPLANT
CLOSURE STERI-STRIP 1/2X4 (GAUZE/BANDAGES/DRESSINGS) ×1
CLSR STERI-STRIP ANTIMIC 1/2X4 (GAUZE/BANDAGES/DRESSINGS) ×2 IMPLANT
COVER BACK TABLE REUSABLE LG (DRAPES) ×3 IMPLANT
COVER WAND RF STERILE (DRAPES) IMPLANT
CUFF TOURN SGL QUICK 18X3 (MISCELLANEOUS) ×4 IMPLANT
CUFF TOURN SGL QUICK 18X4 (TOURNIQUET CUFF) IMPLANT
DECANTER SPIKE VIAL GLASS SM (MISCELLANEOUS) ×1 IMPLANT
DRAPE EXTREMITY T 121X128X90 (DISPOSABLE) ×3 IMPLANT
DRAPE IMP U-DRAPE 54X76 (DRAPES) ×3 IMPLANT
DRAPE OEC MINIVIEW 54X84 (DRAPES) ×3 IMPLANT
DRAPE U-SHAPE 47X51 STRL (DRAPES) ×3 IMPLANT
DRSG MEPILEX BORDER 4X8 (GAUZE/BANDAGES/DRESSINGS) ×2 IMPLANT
DRSG PAD ABDOMINAL 8X10 ST (GAUZE/BANDAGES/DRESSINGS) ×2 IMPLANT
DRSG TEGADERM 2-3/8X2-3/4 SM (GAUZE/BANDAGES/DRESSINGS) ×2 IMPLANT
DURAPREP 26ML APPLICATOR (WOUND CARE) ×1 IMPLANT
ELECT REM PT RETURN 9FT ADLT (ELECTROSURGICAL) ×3
ELECTRODE REM PT RTRN 9FT ADLT (ELECTROSURGICAL) ×1 IMPLANT
GAUZE SPONGE 4X4 12PLY STRL (GAUZE/BANDAGES/DRESSINGS) ×1 IMPLANT
GAUZE XEROFORM 1X8 LF (GAUZE/BANDAGES/DRESSINGS) IMPLANT
GLOVE BIO SURGEON STRL SZ 6.5 (GLOVE) ×1 IMPLANT
GLOVE BIO SURGEON STRL SZ7 (GLOVE) ×2 IMPLANT
GLOVE BIO SURGEON STRL SZ8 (GLOVE) ×5 IMPLANT
GLOVE BIO SURGEONS STRL SZ 6.5 (GLOVE) ×1
GLOVE BIOGEL PI IND STRL 7.0 (GLOVE) IMPLANT
GLOVE BIOGEL PI IND STRL 7.5 (GLOVE) IMPLANT
GLOVE BIOGEL PI IND STRL 8 (GLOVE) ×2 IMPLANT
GLOVE BIOGEL PI INDICATOR 7.0 (GLOVE) ×4
GLOVE BIOGEL PI INDICATOR 7.5 (GLOVE) ×2
GLOVE BIOGEL PI INDICATOR 8 (GLOVE) ×4
GLOVE ORTHO TXT STRL SZ7.5 (GLOVE) ×3 IMPLANT
GOWN STRL REUS W/ TWL LRG LVL3 (GOWN DISPOSABLE) ×1 IMPLANT
GOWN STRL REUS W/ TWL XL LVL3 (GOWN DISPOSABLE) ×2 IMPLANT
GOWN STRL REUS W/TWL LRG LVL3 (GOWN DISPOSABLE) ×6
GOWN STRL REUS W/TWL XL LVL3 (GOWN DISPOSABLE) ×6
K-WIRE .062X4 (WIRE) ×6 IMPLANT
NDL HYPO 25X1 1.5 SAFETY (NEEDLE) IMPLANT
NEEDLE HYPO 25X1 1.5 SAFETY (NEEDLE) IMPLANT
NS IRRIG 1000ML POUR BTL (IV SOLUTION) ×3 IMPLANT
PACK BASIN DAY SURGERY FS (CUSTOM PROCEDURE TRAY) ×3 IMPLANT
PAD CAST 3X4 CTTN HI CHSV (CAST SUPPLIES) IMPLANT
PAD CAST 4YDX4 CTTN HI CHSV (CAST SUPPLIES) ×1 IMPLANT
PADDING CAST ABS 4INX4YD NS (CAST SUPPLIES)
PADDING CAST ABS COTTON 4X4 ST (CAST SUPPLIES) ×1 IMPLANT
PADDING CAST COTTON 3X4 STRL (CAST SUPPLIES) ×3
PADDING CAST COTTON 4X4 STRL (CAST SUPPLIES) ×3
PADDING UNDERCAST 2 STRL (CAST SUPPLIES)
PADDING UNDERCAST 2X4 STRL (CAST SUPPLIES) IMPLANT
PENCIL BUTTON HOLSTER BLD 10FT (ELECTRODE) ×3 IMPLANT
SLEEVE SCD COMPRESS KNEE MED (MISCELLANEOUS) ×3 IMPLANT
SLING ARM FOAM STRAP LRG (SOFTGOODS) ×2 IMPLANT
SPLINT FAST PLASTER 5X30 (CAST SUPPLIES) ×20
SPLINT PLASTER CAST FAST 5X30 (CAST SUPPLIES) IMPLANT
SPLINT PLASTER CAST XFAST 4X15 (CAST SUPPLIES) IMPLANT
SPLINT PLASTER XTRA FAST SET 4 (CAST SUPPLIES)
SPONGE LAP 4X18 RFD (DISPOSABLE) ×1 IMPLANT
STOCKINETTE 4X48 STRL (DRAPES) ×3 IMPLANT
SUCTION FRAZIER HANDLE 10FR (MISCELLANEOUS) ×2
SUCTION TUBE FRAZIER 10FR DISP (MISCELLANEOUS) ×1 IMPLANT
SUT ETHIBOND 0 MO6 C/R (SUTURE) IMPLANT
SUT ETHILON 3 0 PS 1 (SUTURE) IMPLANT
SUT ETHILON 4 0 PS 2 18 (SUTURE) IMPLANT
SUT MNCRL AB 4-0 PS2 18 (SUTURE) ×1 IMPLANT
SUT VIC AB 0 SH 27 (SUTURE) ×2 IMPLANT
SUT VIC AB 3-0 SH 27 (SUTURE)
SUT VIC AB 3-0 SH 27X BRD (SUTURE) ×1 IMPLANT
SUT VICRYL 3-0 CR8 SH (SUTURE) ×2 IMPLANT
SYR BULB 3OZ (MISCELLANEOUS) ×3 IMPLANT
SYR CONTROL 10ML LL (SYRINGE) IMPLANT
TOWEL GREEN STERILE FF (TOWEL DISPOSABLE) ×3 IMPLANT
TUBE CONNECTING 20'X1/4 (TUBING) ×1
TUBE CONNECTING 20X1/4 (TUBING) ×2 IMPLANT
UNDERPAD 30X30 (UNDERPADS AND DIAPERS) ×3 IMPLANT

## 2018-07-28 NOTE — Discharge Instructions (Signed)
Post Anesthesia Home Care Instructions  Activity: Get plenty of rest for the remainder of the day. A responsible individual must stay with you for 24 hours following the procedure.  For the next 24 hours, DO NOT: -Drive a car -Advertising copywriter -Drink alcoholic beverages -Take any medication unless instructed by your physician -Make any legal decisions or sign important papers.  Meals: Start with liquid foods such as gelatin or soup. Progress to regular foods as tolerated. Avoid greasy, spicy, heavy foods. If nausea and/or vomiting occur, drink only clear liquids until the nausea and/or vomiting subsides. Call your physician if vomiting continues.  Special Instructions/Symptoms: Your throat may feel dry or sore from the anesthesia or the breathing tube placed in your throat during surgery. If this causes discomfort, gargle with warm salt water. The discomfort should disappear within 24 hours.  If you had a scopolamine patch placed behind your ear for the management of post- operative nausea and/or vomiting:  1. The medication in the patch is effective for 72 hours, after which it should be removed.  Wrap patch in a tissue and discard in the trash. Wash hands thoroughly with soap and water. 2. You may remove the patch earlier than 72 hours if you experience unpleasant side effects which may include dry mouth, dizziness or visual disturbances. 3. Avoid touching the patch. Wash your hands with soap and water after contact with the patch.       Regional Anesthesia Blocks  1. Numbness or the inability to move the "blocked" extremity may last from 3-48 hours after placement. The length of time depends on the medication injected and your individual response to the medication. If the numbness is not going away after 48 hours, call your surgeon.  2. The extremity that is blocked will need to be protected until the numbness is gone and the  Strength has returned. Because you cannot feel it, you  will need to take extra care to avoid injury. Because it may be weak, you may have difficulty moving it or using it. You may not know what position it is in without looking at it while the block is in effect.  3. For blocks in the legs and feet, returning to weight bearing and walking needs to be done carefully. You will need to wait until the numbness is entirely gone and the strength has returned. You should be able to move your leg and foot normally before you try and bear weight or walk. You will need someone to be with you when you first try to ensure you do not fall and possibly risk injury.  4. Bruising and tenderness at the needle site are common side effects and will resolve in a few days.  5. Persistent numbness or new problems with movement should be communicated to the surgeon or the [facility] [phone] [facility] [phone]).    Diet: As you were doing prior to hospitalization   Shower:  May shower but keep the wounds dry, use an occlusive plastic wrap, NO SOAKING IN TUB.  If the bandage gets wet, change with a clean dry gauze.  If you have a splint on, leave the splint in place and keep the splint dry with a plastic bag.  Dressing:  You may change your dressing 3-5 days after surgery, unless you have a splint.  If you have a splint, then just leave the splint in place and we will change your bandages during your first follow-up appointment.  If you had hand or foot surgery, we will plan to remove your stitches in about 2 weeks in the office.  For all other surgeries, there are sticky tapes (steri-strips) on your wounds and all the stitches are absorbable.  Leave the steri-strips in place when changing your dressings, they will peel off with time, usually 2-3 weeks.  Activity:  Increase activity slowly as tolerated, but follow the weight bearing instructions below.  The rules on driving is that you can not be taking narcotics while you drive, and  you must feel in control of the vehicle.    Weight Bearing:   No lifting with left arm.    To prevent constipation: you may use a stool softener such as -  Colace (over the counter) 100 mg by mouth twice a day  Drink plenty of fluids (prune juice may be helpful) and high fiber foods Miralax (over the counter) for constipation as needed.    Itching:  If you experience itching with your medications, try taking only a single pain pill, or even half a pain pill at a time.  You may take up to 10 pain pills per day, and you can also use benadryl over the counter for itching or also to help with sleep.   Precautions:  If you experience chest pain or shortness of breath - call 911 immediately for transfer to the hospital emergency department!!  If you develop a fever greater that 101 F, purulent drainage from wound, increased redness or drainage from wound, or calf pain -- Call the office at [phone]                                                Follow- Up Appointment:  Please call for an appointment to be seen in [place] [phone]

## 2018-07-28 NOTE — Anesthesia Procedure Notes (Signed)
Anesthesia Regional Block: Supraclavicular block   Pre-Anesthetic Checklist: ,, timeout performed, Correct Patient, Correct Site, Correct Laterality, Correct Procedure, Correct Position, site marked, Risks and benefits discussed,  Surgical consent,  Pre-op evaluation,  At surgeon's request and post-op pain management  Laterality: Left  Prep: chloraprep       Needles:  Injection technique: Single-shot  Needle Type: Stimiplex     Needle Length: 9cm  Needle Gauge: 21     Additional Needles:   Procedures:,,,, ultrasound used (permanent image in chart),,,,  Narrative:  Start time: 07/28/2018 10:34 AM End time: 07/28/2018 10:39 AM Injection made incrementally with aspirations every 5 mL.  Performed by: Personally  Anesthesiologist: Lowella Curb, MD

## 2018-07-28 NOTE — Progress Notes (Signed)
Assisted Dr. Miller with left, ultrasound guided, supraclavicular block. Side rails up, monitors on throughout procedure. See vital signs in flow sheet. Tolerated Procedure well. 

## 2018-07-28 NOTE — Op Note (Signed)
07/28/2018  12:41 PM  PATIENT:  Shannon Mcintyre    PRE-OPERATIVE DIAGNOSIS:  LEFT displaced olecranon fracture, closed  POST-OPERATIVE DIAGNOSIS:  Same  PROCEDURE: Left elbow open reduction internal fixation olecranon fracture  SURGEON:  Eulas Post, MD  PHYSICIAN ASSISTANT: Janace Litten, OPA-C, present and scrubbed throughout the case, critical for completion in a timely fashion, and for retraction, instrumentation, and closure.  ANESTHESIA:   General with regional block  PREOPERATIVE INDICATIONS:  [name] is a  85 y.o. female who had a displaced left olecranon fracture and elected for surgical management.  The risks benefits and alternatives were discussed with the patient preoperatively including but not limited to the risks of infection, bleeding, nerve injury, cardiopulmonary complications, the need for revision surgery, among others, and the patient was willing to proceed.  We also discussed the risks for loss of fixation, hardware prominence, hardware failure, need for hardware removal, need for future revision surgery, posttraumatic arthrosis, among others.  ESTIMATED BLOOD LOSS: Minimal  OPERATIVE IMPLANTS: 0.0625 inch K wires x2 with a 1.0 mm cable from Zimmer tension band  OPERATIVE FINDINGS: Moderate osteoporosis, displaced olecranon fracture.  OPERATIVE PROCEDURE: The patient was brought to the operating room and placed in supine position.  General anesthesia was administered.  The left upper extremity was positioned, she was in a lateral decubitus position with all bony prominences padded.  The upper extremity was prepped and draped in usual sterile fashion.  Timeout performed.  Posterior incision was made, and the fracture site exposed.  The fracture was cleaned, irrigated, reduced, held with a clamp, K wires were placed and position confirmed under live fluoroscopy.  I engaged the K wires in the far cortex.  These were left slightly short, in  anticipation of burying them further.  I placed the cable through a drill hole in the ulnar shaft, and then around the appropriate side of the K wires, tension the cable, bent the K wires and delivered them down through the triceps with slight incision through the tendon to try and minimize risk for backing out or prominence.  These were secured using a bone tamp, the elbow was taken through range of motion had a smooth arc, and C-arm used to confirm position and fracture reduction.  The ulnar nerve was protected and avoided throughout the case, the wounds were irrigated copiously and closed with subcutaneous Vicryl followed by routine closure for the skin with Steri-Strips and sterile gauze.  A posterior splint was applied, she was turned to the supine position and was awakened and extubated and returned to the PACU in stable and satisfactory condition.  There were no complications.  She tolerated the procedure well.

## 2018-07-28 NOTE — Transfer of Care (Signed)
Immediate Anesthesia Transfer of Care Note  Patient: Shannon Mcintyre  Procedure(s) Performed: OPEN REDUCTION INTERNAL FIXATION (ORIF) ELBOW/OLECRANON FRACTURE (Left Elbow)  Patient Location: PACU  Anesthesia Type:General  Level of Consciousness: awake, alert  and oriented  Airway & Oxygen Therapy: Patient Spontanous Breathing and Patient connected to face mask oxygen  Post-op Assessment: Report given to RN and Post -op Vital signs reviewed and stable  Post vital signs: Reviewed and stable  Last Vitals:  Vitals Value Taken Time  BP    Temp    Pulse 74 07/28/2018  1:06 PM  Resp 11 07/28/2018  1:06 PM  SpO2 98 % 07/28/2018  1:06 PM  Vitals shown include unvalidated device data.  Last Pain:  Vitals:   07/28/18 1000  TempSrc: Oral  PainSc: 0-No pain      Patients Stated Pain Goal: 1 (07/28/18 1000)  Complications: No apparent anesthesia complications

## 2018-07-28 NOTE — Anesthesia Procedure Notes (Signed)
Procedure Name: Intubation Performed by: Verita Lamb, CRNA Pre-anesthesia Checklist: Patient identified, Emergency Drugs available, Suction available, Patient being monitored and Timeout performed Patient Re-evaluated:Patient Re-evaluated prior to induction Oxygen Delivery Method: Circle system utilized Preoxygenation: Pre-oxygenation with 100% oxygen Induction Type: IV induction Ventilation: Mask ventilation without difficulty Laryngoscope Size: Mac, Miller, 4 and 2 Grade View: Grade III Tube type: Oral Tube size: 7.0 mm Number of attempts: 1 Airway Equipment and Method: Stylet Placement Confirmation: ETT inserted through vocal cords under direct vision,  positive ETCO2,  CO2 detector and breath sounds checked- equal and bilateral Secured at: 21 cm Tube secured with: Tape Dental Injury: Teeth and Oropharynx as per pre-operative assessment  Difficulty Due To: Difficulty was unanticipated, Difficult Airway- due to reduced neck mobility, Difficult Airway- due to anterior larynx and Difficult Airway- due to immobile epiglottis Future Recommendations: Recommend- induction with short-acting agent, and alternative techniques readily available Comments: Smooth iv induction, easy mask ventilation, rsi induction.  Attempted with mac 4 blade.  Grade IV view. Very little mouth and neck mobility.  Epiglottis very large once appreciated.  Attempted to lift the epiglottis with a bougie.  Switched to [employer] 2 blade and took all pillows out.  Firm cricoid pressure applied and able to appreciate an arytenoid.  Lifted the epiglottis with bougie and felt cricoid rings.  ETT threaded over bougie.  bbs and positive etco2 noted. Recommend glidescope in future.

## 2018-07-28 NOTE — Anesthesia Preprocedure Evaluation (Signed)
Anesthesia Evaluation  Patient identified by MRN, date of birth, ID band  Reviewed: Allergy & Precautions, NPO status , Patient's Chart, lab work & pertinent test results  Airway Mallampati: I  TM Distance: >3 FB Neck ROM: Full    Dental no notable dental hx.    Pulmonary neg pulmonary ROS,    Pulmonary exam normal breath sounds clear to auscultation       Cardiovascular hypertension, Normal cardiovascular exam Rhythm:Regular Rate:Normal     Neuro/Psych negative neurological ROS  negative psych ROS   GI/Hepatic negative GI ROS, Neg liver ROS,   Endo/Other  negative endocrine ROS  Renal/GU negative Renal ROS     Musculoskeletal  (+) Arthritis , Osteoarthritis,    Abdominal Normal abdominal exam  (+)   Peds  Hematology negative hematology ROS (+)   Anesthesia Other Findings   Reproductive/Obstetrics                             Anesthesia Physical  Anesthesia Plan  ASA: II  Anesthesia Plan: MAC   Post-op Pain Management:  Regional for Post-op pain   Induction: Intravenous  PONV Risk Score and Plan: 2 and Ondansetron, Midazolam and Treatment may vary due to age or medical condition  Airway Management Planned: Simple Face Mask  Additional Equipment:   Intra-op Plan:   Post-operative Plan:   Informed Consent: I have reviewed the patients History and Physical, chart, labs and discussed the procedure including the risks, benefits and alternatives for the proposed anesthesia with the patient or authorized representative who has indicated his/her understanding and acceptance.     Dental advisory given  Plan Discussed with: CRNA and Surgeon  Anesthesia Plan Comments:         Anesthesia Quick Evaluation

## 2018-07-28 NOTE — H&P (Signed)
PREOPERATIVE H&P  Chief Complaint: Left elbow pain  HPI: Shannon Mcintyre is a 85 y.o. female who presents for preoperative history and physical with a diagnosis of left olecranon fracture. Symptoms are rated as moderate to severe, and have been worsening.  This is significantly impairing activities of daily living.  She has elected for surgical management.  This occurred after a fall.  She has significant displacement.  Difficulty with function.  Denies any other injuries.  Past Medical History:  Diagnosis Date  . Arthritis   . Diverticulosis of colon   . Eczema   . Hypertension   . Left elbow fracture   . Primary localized osteoarthritis of left knee    Past Surgical History:  Procedure Laterality Date  . ABDOMINAL HYSTERECTOMY  1980   BSO  . APPENDECTOMY  1980  . knee arthoscopy  2010   right  . KNEE ARTHROSCOPY  2001   left  . TOTAL KNEE ARTHROPLASTY Left 08/12/2016   Procedure: LEFT TOTAL KNEE ARTHROPLASTY;  Surgeon: Salvatore Marvel, MD;  Location: [facility] OR;  Service: Orthopedics;  Laterality: Left;   Social History   Socioeconomic History  . Marital status: Married    Spouse name: Not on file  . Number of children: Not on file  . Years of education: Not on file  . Highest education level: Not on file  Occupational History  . Not on file  Social Needs  . Financial resource strain: Not on file  . Food insecurity:    Worry: Not on file    Inability: Not on file  . Transportation needs:    Medical: Not on file    Non-medical: Not on file  Tobacco Use  . Smoking status: Never Smoker  . Smokeless tobacco: Never Used  Substance and Sexual Activity  . Alcohol use: No  . Drug use: No  . Sexual activity: Not on file  Lifestyle  . Physical activity:    Days per week: Not on file    Minutes per session: Not on file  . Stress: Not on file  Relationships  . Social connections:    Talks on phone: Not on file    Gets together: Not on file    Attends religious service:  Not on file    Active member of club or organization: Not on file    Attends meetings of clubs or organizations: Not on file    Relationship status: Not on file  Other Topics Concern  . Not on file  Social History Narrative  . Not on file   Family History  Problem Relation Age of Onset  . Pneumonia Mother 46  . Heart attack Father 8       Possibly CAD starting in his 85s  . Parkinson's disease Brother    No Known Allergies Prior to Admission medications   Medication Sig Start Date End Date Taking? Authorizing Provider  Biotin 1000 MCG tablet Take 1,000 mcg by mouth 3 (three) times daily.   Yes [provider]  Cholecalciferol (VITAMIN D3) 50 MCG (2000 UT) TABS Take 2,000 Units by mouth daily.   Yes [provider]  folic acid (FOLVITE) 1 MG tablet Take 1 mg by mouth daily.   Yes [provider]  losartan (COZAAR) 25 MG tablet Take 25 mg by mouth daily.   Yes [provider]  methotrexate (RHEUMATREX) 2.5 MG tablet Take 2.5 mg by mouth once a week. Caution:Chemotherapy. Protect from light. PATIENT TAKES 4TABS IN AM AND  4 TABS IN PM WEEKLY   Yes [provider]  solifenacin (VESICARE) 5 MG tablet Take 5 mg by mouth daily.   Yes [provider]  oxyCODONE (OXY IR/ROXICODONE) 5 MG immediate release tablet Take 1 tablet (5 mg total) by mouth every 6 (six) hours as needed for breakthrough pain. 07/25/18   Azalia Bilis, MD     Positive ROS: All other systems have been reviewed and were otherwise negative with the exception of those mentioned in the HPI and as above.  Physical Exam: General: Alert, no acute distress Cardiovascular: No pedal edema Respiratory: No cyanosis, no use of accessory musculature GI: No organomegaly, abdomen is soft and non-tender Skin: No lesions in the area of chief complaint Neurologic: Sensation intact distally Psychiatric: Patient is competent for consent with normal mood and affect Lymphatic: No  axillary or cervical lymphadenopathy  MUSCULOSKELETAL: Left elbow has sensation intact throughout the hand, all fingers flex extend and abduct, positive pain to palpation with ecchymosis around the elbow.  Assessment: Left olecranon fracture   Plan: Plan for Procedure(s): OPEN REDUCTION INTERNAL FIXATION (ORIF) ELBOW/OLECRANON FRACTURE  The risks benefits and alternatives were discussed with the patient including but not limited to the risks of nonoperative treatment, versus surgical intervention including infection, bleeding, nerve injury, malunion, nonunion, the need for revision surgery, hardware prominence, hardware failure, the need for hardware removal, blood clots, cardiopulmonary complications, morbidity, mortality, among others, and they were willing to proceed.    Eulas Post, MD Cell [phone]   07/28/2018 10:38 AM

## 2018-07-28 NOTE — Anesthesia Postprocedure Evaluation (Signed)
Anesthesia Post Note  Patient: Shannon Mcintyre  Procedure(s) Performed: OPEN REDUCTION INTERNAL FIXATION (ORIF) ELBOW/OLECRANON FRACTURE (Left Elbow)     Patient location during evaluation: PACU Anesthesia Type: General Level of consciousness: awake and alert Pain management: pain level controlled Vital Signs Assessment: post-procedure vital signs reviewed and stable Respiratory status: spontaneous breathing, nonlabored ventilation and respiratory function stable Cardiovascular status: blood pressure returned to baseline and stable Postop Assessment: no apparent nausea or vomiting Anesthetic complications: no    Last Vitals:  Vitals:   07/28/18 1330 07/28/18 1345  BP: 134/66 140/61  Pulse: 72 71  Resp: (!) 33 13  Temp:    SpO2: 93% 94%    Last Pain:  Vitals:   07/28/18 1415  TempSrc:   PainSc: 0-No pain                 Lowella Curb

## 2018-07-29 ENCOUNTER — Encounter (HOSPITAL_BASED_OUTPATIENT_CLINIC_OR_DEPARTMENT_OTHER): Admission: RE | Payer: Self-pay | Source: Home / Self Care

## 2018-07-29 ENCOUNTER — Ambulatory Visit (HOSPITAL_BASED_OUTPATIENT_CLINIC_OR_DEPARTMENT_OTHER): Admission: RE | Admit: 2018-07-29 | Payer: Medicare Other | Source: Home / Self Care | Admitting: Orthopedic Surgery

## 2018-07-29 ENCOUNTER — Encounter (HOSPITAL_BASED_OUTPATIENT_CLINIC_OR_DEPARTMENT_OTHER): Payer: Self-pay | Admitting: Orthopedic Surgery

## 2018-07-29 SURGERY — OPEN REDUCTION INTERNAL FIXATION (ORIF) ELBOW/OLECRANON FRACTURE
Anesthesia: General | Laterality: Left

## 2018-08-10 DIAGNOSIS — S52025D Nondisplaced fracture of olecranon process without intraarticular extension of left ulna, subsequent encounter for closed fracture with routine healing: Secondary | ICD-10-CM | POA: Diagnosis not present

## 2018-08-28 DIAGNOSIS — S52025D Nondisplaced fracture of olecranon process without intraarticular extension of left ulna, subsequent encounter for closed fracture with routine healing: Secondary | ICD-10-CM | POA: Diagnosis not present

## 2018-09-01 DIAGNOSIS — M25622 Stiffness of left elbow, not elsewhere classified: Secondary | ICD-10-CM | POA: Diagnosis not present

## 2018-09-01 DIAGNOSIS — M6281 Muscle weakness (generalized): Secondary | ICD-10-CM | POA: Diagnosis not present

## 2018-09-01 DIAGNOSIS — S52025D Nondisplaced fracture of olecranon process without intraarticular extension of left ulna, subsequent encounter for closed fracture with routine healing: Secondary | ICD-10-CM | POA: Diagnosis not present

## 2018-09-01 DIAGNOSIS — M25522 Pain in left elbow: Secondary | ICD-10-CM | POA: Diagnosis not present

## 2018-09-04 DIAGNOSIS — S52025D Nondisplaced fracture of olecranon process without intraarticular extension of left ulna, subsequent encounter for closed fracture with routine healing: Secondary | ICD-10-CM | POA: Diagnosis not present

## 2018-09-04 DIAGNOSIS — M25522 Pain in left elbow: Secondary | ICD-10-CM | POA: Diagnosis not present

## 2018-09-04 DIAGNOSIS — M6281 Muscle weakness (generalized): Secondary | ICD-10-CM | POA: Diagnosis not present

## 2018-09-04 DIAGNOSIS — M25622 Stiffness of left elbow, not elsewhere classified: Secondary | ICD-10-CM | POA: Diagnosis not present

## 2018-09-07 DIAGNOSIS — S52025D Nondisplaced fracture of olecranon process without intraarticular extension of left ulna, subsequent encounter for closed fracture with routine healing: Secondary | ICD-10-CM | POA: Diagnosis not present

## 2018-09-07 DIAGNOSIS — M6281 Muscle weakness (generalized): Secondary | ICD-10-CM | POA: Diagnosis not present

## 2018-09-07 DIAGNOSIS — M25522 Pain in left elbow: Secondary | ICD-10-CM | POA: Diagnosis not present

## 2018-09-07 DIAGNOSIS — M25622 Stiffness of left elbow, not elsewhere classified: Secondary | ICD-10-CM | POA: Diagnosis not present

## 2018-09-09 DIAGNOSIS — M25522 Pain in left elbow: Secondary | ICD-10-CM | POA: Diagnosis not present

## 2018-09-09 DIAGNOSIS — M25622 Stiffness of left elbow, not elsewhere classified: Secondary | ICD-10-CM | POA: Diagnosis not present

## 2018-09-09 DIAGNOSIS — M6281 Muscle weakness (generalized): Secondary | ICD-10-CM | POA: Diagnosis not present

## 2018-09-09 DIAGNOSIS — S52025D Nondisplaced fracture of olecranon process without intraarticular extension of left ulna, subsequent encounter for closed fracture with routine healing: Secondary | ICD-10-CM | POA: Diagnosis not present

## 2018-09-14 DIAGNOSIS — S52025D Nondisplaced fracture of olecranon process without intraarticular extension of left ulna, subsequent encounter for closed fracture with routine healing: Secondary | ICD-10-CM | POA: Diagnosis not present

## 2018-09-14 DIAGNOSIS — M25622 Stiffness of left elbow, not elsewhere classified: Secondary | ICD-10-CM | POA: Diagnosis not present

## 2018-09-14 DIAGNOSIS — M25522 Pain in left elbow: Secondary | ICD-10-CM | POA: Diagnosis not present

## 2018-09-14 DIAGNOSIS — M6281 Muscle weakness (generalized): Secondary | ICD-10-CM | POA: Diagnosis not present

## 2018-09-16 DIAGNOSIS — S52025D Nondisplaced fracture of olecranon process without intraarticular extension of left ulna, subsequent encounter for closed fracture with routine healing: Secondary | ICD-10-CM | POA: Diagnosis not present

## 2018-09-16 DIAGNOSIS — M25522 Pain in left elbow: Secondary | ICD-10-CM | POA: Diagnosis not present

## 2018-09-16 DIAGNOSIS — M6281 Muscle weakness (generalized): Secondary | ICD-10-CM | POA: Diagnosis not present

## 2018-09-16 DIAGNOSIS — M25622 Stiffness of left elbow, not elsewhere classified: Secondary | ICD-10-CM | POA: Diagnosis not present

## 2018-09-21 DIAGNOSIS — M25522 Pain in left elbow: Secondary | ICD-10-CM | POA: Diagnosis not present

## 2018-09-21 DIAGNOSIS — M6281 Muscle weakness (generalized): Secondary | ICD-10-CM | POA: Diagnosis not present

## 2018-09-21 DIAGNOSIS — S52025D Nondisplaced fracture of olecranon process without intraarticular extension of left ulna, subsequent encounter for closed fracture with routine healing: Secondary | ICD-10-CM | POA: Diagnosis not present

## 2018-09-21 DIAGNOSIS — M25622 Stiffness of left elbow, not elsewhere classified: Secondary | ICD-10-CM | POA: Diagnosis not present

## 2018-09-23 DIAGNOSIS — M25522 Pain in left elbow: Secondary | ICD-10-CM | POA: Diagnosis not present

## 2018-09-23 DIAGNOSIS — S52025D Nondisplaced fracture of olecranon process without intraarticular extension of left ulna, subsequent encounter for closed fracture with routine healing: Secondary | ICD-10-CM | POA: Diagnosis not present

## 2018-09-23 DIAGNOSIS — M25622 Stiffness of left elbow, not elsewhere classified: Secondary | ICD-10-CM | POA: Diagnosis not present

## 2018-09-23 DIAGNOSIS — M6281 Muscle weakness (generalized): Secondary | ICD-10-CM | POA: Diagnosis not present

## 2018-09-25 DIAGNOSIS — S52025D Nondisplaced fracture of olecranon process without intraarticular extension of left ulna, subsequent encounter for closed fracture with routine healing: Secondary | ICD-10-CM | POA: Diagnosis not present

## 2018-09-28 DIAGNOSIS — S52025D Nondisplaced fracture of olecranon process without intraarticular extension of left ulna, subsequent encounter for closed fracture with routine healing: Secondary | ICD-10-CM | POA: Diagnosis not present

## 2018-09-28 DIAGNOSIS — M6281 Muscle weakness (generalized): Secondary | ICD-10-CM | POA: Diagnosis not present

## 2018-09-28 DIAGNOSIS — M25622 Stiffness of left elbow, not elsewhere classified: Secondary | ICD-10-CM | POA: Diagnosis not present

## 2018-09-28 DIAGNOSIS — M25522 Pain in left elbow: Secondary | ICD-10-CM | POA: Diagnosis not present

## 2018-09-30 DIAGNOSIS — M25622 Stiffness of left elbow, not elsewhere classified: Secondary | ICD-10-CM | POA: Diagnosis not present

## 2018-09-30 DIAGNOSIS — S52025D Nondisplaced fracture of olecranon process without intraarticular extension of left ulna, subsequent encounter for closed fracture with routine healing: Secondary | ICD-10-CM | POA: Diagnosis not present

## 2018-09-30 DIAGNOSIS — M6281 Muscle weakness (generalized): Secondary | ICD-10-CM | POA: Diagnosis not present

## 2018-09-30 DIAGNOSIS — M25522 Pain in left elbow: Secondary | ICD-10-CM | POA: Diagnosis not present

## 2018-10-05 DIAGNOSIS — S52025D Nondisplaced fracture of olecranon process without intraarticular extension of left ulna, subsequent encounter for closed fracture with routine healing: Secondary | ICD-10-CM | POA: Diagnosis not present

## 2018-10-05 DIAGNOSIS — M25622 Stiffness of left elbow, not elsewhere classified: Secondary | ICD-10-CM | POA: Diagnosis not present

## 2018-10-05 DIAGNOSIS — M6281 Muscle weakness (generalized): Secondary | ICD-10-CM | POA: Diagnosis not present

## 2018-10-05 DIAGNOSIS — M25522 Pain in left elbow: Secondary | ICD-10-CM | POA: Diagnosis not present

## 2018-10-07 DIAGNOSIS — M25622 Stiffness of left elbow, not elsewhere classified: Secondary | ICD-10-CM | POA: Diagnosis not present

## 2018-10-07 DIAGNOSIS — M6281 Muscle weakness (generalized): Secondary | ICD-10-CM | POA: Diagnosis not present

## 2018-10-07 DIAGNOSIS — M25522 Pain in left elbow: Secondary | ICD-10-CM | POA: Diagnosis not present

## 2018-10-07 DIAGNOSIS — S52025D Nondisplaced fracture of olecranon process without intraarticular extension of left ulna, subsequent encounter for closed fracture with routine healing: Secondary | ICD-10-CM | POA: Diagnosis not present

## 2018-10-12 DIAGNOSIS — M25622 Stiffness of left elbow, not elsewhere classified: Secondary | ICD-10-CM | POA: Diagnosis not present

## 2018-10-12 DIAGNOSIS — M25522 Pain in left elbow: Secondary | ICD-10-CM | POA: Diagnosis not present

## 2018-10-12 DIAGNOSIS — S52025D Nondisplaced fracture of olecranon process without intraarticular extension of left ulna, subsequent encounter for closed fracture with routine healing: Secondary | ICD-10-CM | POA: Diagnosis not present

## 2018-10-12 DIAGNOSIS — M6281 Muscle weakness (generalized): Secondary | ICD-10-CM | POA: Diagnosis not present

## 2018-10-14 DIAGNOSIS — M6281 Muscle weakness (generalized): Secondary | ICD-10-CM | POA: Diagnosis not present

## 2018-10-14 DIAGNOSIS — M25622 Stiffness of left elbow, not elsewhere classified: Secondary | ICD-10-CM | POA: Diagnosis not present

## 2018-10-14 DIAGNOSIS — M25522 Pain in left elbow: Secondary | ICD-10-CM | POA: Diagnosis not present

## 2018-10-14 DIAGNOSIS — S52025D Nondisplaced fracture of olecranon process without intraarticular extension of left ulna, subsequent encounter for closed fracture with routine healing: Secondary | ICD-10-CM | POA: Diagnosis not present

## 2018-10-19 DIAGNOSIS — N390 Urinary tract infection, site not specified: Secondary | ICD-10-CM | POA: Diagnosis not present

## 2018-10-21 DIAGNOSIS — M6281 Muscle weakness (generalized): Secondary | ICD-10-CM | POA: Diagnosis not present

## 2018-10-21 DIAGNOSIS — M25522 Pain in left elbow: Secondary | ICD-10-CM | POA: Diagnosis not present

## 2018-10-21 DIAGNOSIS — M25622 Stiffness of left elbow, not elsewhere classified: Secondary | ICD-10-CM | POA: Diagnosis not present

## 2018-10-21 DIAGNOSIS — S52025D Nondisplaced fracture of olecranon process without intraarticular extension of left ulna, subsequent encounter for closed fracture with routine healing: Secondary | ICD-10-CM | POA: Diagnosis not present

## 2018-10-23 DIAGNOSIS — S52025D Nondisplaced fracture of olecranon process without intraarticular extension of left ulna, subsequent encounter for closed fracture with routine healing: Secondary | ICD-10-CM | POA: Diagnosis not present

## 2018-10-26 DIAGNOSIS — S52025D Nondisplaced fracture of olecranon process without intraarticular extension of left ulna, subsequent encounter for closed fracture with routine healing: Secondary | ICD-10-CM | POA: Diagnosis not present

## 2018-10-26 DIAGNOSIS — M25522 Pain in left elbow: Secondary | ICD-10-CM | POA: Diagnosis not present

## 2018-10-26 DIAGNOSIS — M25622 Stiffness of left elbow, not elsewhere classified: Secondary | ICD-10-CM | POA: Diagnosis not present

## 2018-10-26 DIAGNOSIS — M6281 Muscle weakness (generalized): Secondary | ICD-10-CM | POA: Diagnosis not present

## 2018-10-28 DIAGNOSIS — S52025D Nondisplaced fracture of olecranon process without intraarticular extension of left ulna, subsequent encounter for closed fracture with routine healing: Secondary | ICD-10-CM | POA: Diagnosis not present

## 2018-10-28 DIAGNOSIS — M25622 Stiffness of left elbow, not elsewhere classified: Secondary | ICD-10-CM | POA: Diagnosis not present

## 2018-10-28 DIAGNOSIS — M25522 Pain in left elbow: Secondary | ICD-10-CM | POA: Diagnosis not present

## 2018-10-28 DIAGNOSIS — M6281 Muscle weakness (generalized): Secondary | ICD-10-CM | POA: Diagnosis not present

## 2018-11-02 DIAGNOSIS — M6281 Muscle weakness (generalized): Secondary | ICD-10-CM | POA: Diagnosis not present

## 2018-11-02 DIAGNOSIS — S52025D Nondisplaced fracture of olecranon process without intraarticular extension of left ulna, subsequent encounter for closed fracture with routine healing: Secondary | ICD-10-CM | POA: Diagnosis not present

## 2018-11-02 DIAGNOSIS — N390 Urinary tract infection, site not specified: Secondary | ICD-10-CM | POA: Diagnosis not present

## 2018-11-02 DIAGNOSIS — M25522 Pain in left elbow: Secondary | ICD-10-CM | POA: Diagnosis not present

## 2018-11-02 DIAGNOSIS — M25622 Stiffness of left elbow, not elsewhere classified: Secondary | ICD-10-CM | POA: Diagnosis not present

## 2018-11-04 DIAGNOSIS — S52025D Nondisplaced fracture of olecranon process without intraarticular extension of left ulna, subsequent encounter for closed fracture with routine healing: Secondary | ICD-10-CM | POA: Diagnosis not present

## 2018-11-04 DIAGNOSIS — M25522 Pain in left elbow: Secondary | ICD-10-CM | POA: Diagnosis not present

## 2018-11-04 DIAGNOSIS — M25622 Stiffness of left elbow, not elsewhere classified: Secondary | ICD-10-CM | POA: Diagnosis not present

## 2018-11-04 DIAGNOSIS — M6281 Muscle weakness (generalized): Secondary | ICD-10-CM | POA: Diagnosis not present

## 2018-11-09 DIAGNOSIS — S52025D Nondisplaced fracture of olecranon process without intraarticular extension of left ulna, subsequent encounter for closed fracture with routine healing: Secondary | ICD-10-CM | POA: Diagnosis not present

## 2018-11-09 DIAGNOSIS — M25522 Pain in left elbow: Secondary | ICD-10-CM | POA: Diagnosis not present

## 2018-11-09 DIAGNOSIS — M6281 Muscle weakness (generalized): Secondary | ICD-10-CM | POA: Diagnosis not present

## 2018-11-09 DIAGNOSIS — M25622 Stiffness of left elbow, not elsewhere classified: Secondary | ICD-10-CM | POA: Diagnosis not present

## 2018-11-20 DIAGNOSIS — S52025D Nondisplaced fracture of olecranon process without intraarticular extension of left ulna, subsequent encounter for closed fracture with routine healing: Secondary | ICD-10-CM | POA: Diagnosis not present

## 2018-11-27 DIAGNOSIS — N183 Chronic kidney disease, stage 3 (moderate): Secondary | ICD-10-CM | POA: Diagnosis not present

## 2018-11-27 DIAGNOSIS — I129 Hypertensive chronic kidney disease with stage 1 through stage 4 chronic kidney disease, or unspecified chronic kidney disease: Secondary | ICD-10-CM | POA: Diagnosis not present

## 2018-11-27 DIAGNOSIS — M1712 Unilateral primary osteoarthritis, left knee: Secondary | ICD-10-CM | POA: Diagnosis not present

## 2018-11-27 DIAGNOSIS — M1990 Unspecified osteoarthritis, unspecified site: Secondary | ICD-10-CM | POA: Diagnosis not present

## 2018-11-27 DIAGNOSIS — M19042 Primary osteoarthritis, left hand: Secondary | ICD-10-CM | POA: Diagnosis not present

## 2018-11-27 DIAGNOSIS — M810 Age-related osteoporosis without current pathological fracture: Secondary | ICD-10-CM | POA: Diagnosis not present

## 2018-11-27 DIAGNOSIS — M19041 Primary osteoarthritis, right hand: Secondary | ICD-10-CM | POA: Diagnosis not present

## 2018-12-01 DIAGNOSIS — Z79899 Other long term (current) drug therapy: Secondary | ICD-10-CM | POA: Diagnosis not present

## 2018-12-01 DIAGNOSIS — L400 Psoriasis vulgaris: Secondary | ICD-10-CM | POA: Diagnosis not present

## 2018-12-09 DIAGNOSIS — M19041 Primary osteoarthritis, right hand: Secondary | ICD-10-CM | POA: Diagnosis not present

## 2018-12-09 DIAGNOSIS — M1990 Unspecified osteoarthritis, unspecified site: Secondary | ICD-10-CM | POA: Diagnosis not present

## 2018-12-09 DIAGNOSIS — M1712 Unilateral primary osteoarthritis, left knee: Secondary | ICD-10-CM | POA: Diagnosis not present

## 2018-12-09 DIAGNOSIS — I129 Hypertensive chronic kidney disease with stage 1 through stage 4 chronic kidney disease, or unspecified chronic kidney disease: Secondary | ICD-10-CM | POA: Diagnosis not present

## 2018-12-09 DIAGNOSIS — N183 Chronic kidney disease, stage 3 (moderate): Secondary | ICD-10-CM | POA: Diagnosis not present

## 2018-12-09 DIAGNOSIS — M810 Age-related osteoporosis without current pathological fracture: Secondary | ICD-10-CM | POA: Diagnosis not present

## 2018-12-09 DIAGNOSIS — M19042 Primary osteoarthritis, left hand: Secondary | ICD-10-CM | POA: Diagnosis not present

## 2018-12-29 DIAGNOSIS — Z23 Encounter for immunization: Secondary | ICD-10-CM | POA: Diagnosis not present

## 2019-01-11 DIAGNOSIS — M8589 Other specified disorders of bone density and structure, multiple sites: Secondary | ICD-10-CM | POA: Diagnosis not present

## 2019-01-11 DIAGNOSIS — Z8262 Family history of osteoporosis: Secondary | ICD-10-CM | POA: Diagnosis not present

## 2019-01-11 DIAGNOSIS — R29890 Loss of height: Secondary | ICD-10-CM | POA: Diagnosis not present

## 2019-01-11 DIAGNOSIS — Z1231 Encounter for screening mammogram for malignant neoplasm of breast: Secondary | ICD-10-CM | POA: Diagnosis not present

## 2019-01-11 DIAGNOSIS — Z96652 Presence of left artificial knee joint: Secondary | ICD-10-CM | POA: Diagnosis not present

## 2019-01-11 DIAGNOSIS — Z90710 Acquired absence of both cervix and uterus: Secondary | ICD-10-CM | POA: Diagnosis not present

## 2019-01-19 DIAGNOSIS — N1831 Chronic kidney disease, stage 3a: Secondary | ICD-10-CM | POA: Diagnosis not present

## 2019-01-19 DIAGNOSIS — M810 Age-related osteoporosis without current pathological fracture: Secondary | ICD-10-CM | POA: Diagnosis not present

## 2019-01-19 DIAGNOSIS — M19041 Primary osteoarthritis, right hand: Secondary | ICD-10-CM | POA: Diagnosis not present

## 2019-01-19 DIAGNOSIS — M1712 Unilateral primary osteoarthritis, left knee: Secondary | ICD-10-CM | POA: Diagnosis not present

## 2019-01-19 DIAGNOSIS — M19042 Primary osteoarthritis, left hand: Secondary | ICD-10-CM | POA: Diagnosis not present

## 2019-01-19 DIAGNOSIS — I129 Hypertensive chronic kidney disease with stage 1 through stage 4 chronic kidney disease, or unspecified chronic kidney disease: Secondary | ICD-10-CM | POA: Diagnosis not present

## 2019-01-19 DIAGNOSIS — M1990 Unspecified osteoarthritis, unspecified site: Secondary | ICD-10-CM | POA: Diagnosis not present

## 2019-02-11 DIAGNOSIS — Z0000 Encounter for general adult medical examination without abnormal findings: Secondary | ICD-10-CM | POA: Diagnosis not present

## 2019-02-11 DIAGNOSIS — N1831 Chronic kidney disease, stage 3a: Secondary | ICD-10-CM | POA: Diagnosis not present

## 2019-02-11 DIAGNOSIS — Z23 Encounter for immunization: Secondary | ICD-10-CM | POA: Diagnosis not present

## 2019-02-11 DIAGNOSIS — I129 Hypertensive chronic kidney disease with stage 1 through stage 4 chronic kidney disease, or unspecified chronic kidney disease: Secondary | ICD-10-CM | POA: Diagnosis not present

## 2019-02-11 DIAGNOSIS — M810 Age-related osteoporosis without current pathological fracture: Secondary | ICD-10-CM | POA: Diagnosis not present

## 2019-02-11 DIAGNOSIS — Z79899 Other long term (current) drug therapy: Secondary | ICD-10-CM | POA: Diagnosis not present

## 2019-02-11 DIAGNOSIS — Z1389 Encounter for screening for other disorder: Secondary | ICD-10-CM | POA: Diagnosis not present

## 2019-02-26 DIAGNOSIS — M1990 Unspecified osteoarthritis, unspecified site: Secondary | ICD-10-CM | POA: Diagnosis not present

## 2019-02-26 DIAGNOSIS — M19042 Primary osteoarthritis, left hand: Secondary | ICD-10-CM | POA: Diagnosis not present

## 2019-02-26 DIAGNOSIS — M810 Age-related osteoporosis without current pathological fracture: Secondary | ICD-10-CM | POA: Diagnosis not present

## 2019-02-26 DIAGNOSIS — N1831 Chronic kidney disease, stage 3a: Secondary | ICD-10-CM | POA: Diagnosis not present

## 2019-02-26 DIAGNOSIS — I129 Hypertensive chronic kidney disease with stage 1 through stage 4 chronic kidney disease, or unspecified chronic kidney disease: Secondary | ICD-10-CM | POA: Diagnosis not present

## 2019-02-26 DIAGNOSIS — M1712 Unilateral primary osteoarthritis, left knee: Secondary | ICD-10-CM | POA: Diagnosis not present

## 2019-02-26 DIAGNOSIS — M19041 Primary osteoarthritis, right hand: Secondary | ICD-10-CM | POA: Diagnosis not present

## 2019-04-01 DIAGNOSIS — M810 Age-related osteoporosis without current pathological fracture: Secondary | ICD-10-CM | POA: Diagnosis not present

## 2019-04-01 DIAGNOSIS — M19042 Primary osteoarthritis, left hand: Secondary | ICD-10-CM | POA: Diagnosis not present

## 2019-04-01 DIAGNOSIS — I129 Hypertensive chronic kidney disease with stage 1 through stage 4 chronic kidney disease, or unspecified chronic kidney disease: Secondary | ICD-10-CM | POA: Diagnosis not present

## 2019-04-01 DIAGNOSIS — M1712 Unilateral primary osteoarthritis, left knee: Secondary | ICD-10-CM | POA: Diagnosis not present

## 2019-04-01 DIAGNOSIS — N183 Chronic kidney disease, stage 3 unspecified: Secondary | ICD-10-CM | POA: Diagnosis not present

## 2019-04-01 DIAGNOSIS — M19041 Primary osteoarthritis, right hand: Secondary | ICD-10-CM | POA: Diagnosis not present

## 2019-04-01 DIAGNOSIS — M1990 Unspecified osteoarthritis, unspecified site: Secondary | ICD-10-CM | POA: Diagnosis not present

## 2019-04-15 DIAGNOSIS — Z23 Encounter for immunization: Secondary | ICD-10-CM | POA: Diagnosis not present

## 2019-04-23 DIAGNOSIS — M1990 Unspecified osteoarthritis, unspecified site: Secondary | ICD-10-CM | POA: Diagnosis not present

## 2019-04-23 DIAGNOSIS — I129 Hypertensive chronic kidney disease with stage 1 through stage 4 chronic kidney disease, or unspecified chronic kidney disease: Secondary | ICD-10-CM | POA: Diagnosis not present

## 2019-04-23 DIAGNOSIS — M1712 Unilateral primary osteoarthritis, left knee: Secondary | ICD-10-CM | POA: Diagnosis not present

## 2019-04-23 DIAGNOSIS — N183 Chronic kidney disease, stage 3 unspecified: Secondary | ICD-10-CM | POA: Diagnosis not present

## 2019-04-23 DIAGNOSIS — M19041 Primary osteoarthritis, right hand: Secondary | ICD-10-CM | POA: Diagnosis not present

## 2019-04-23 DIAGNOSIS — M19042 Primary osteoarthritis, left hand: Secondary | ICD-10-CM | POA: Diagnosis not present

## 2019-04-23 DIAGNOSIS — M810 Age-related osteoporosis without current pathological fracture: Secondary | ICD-10-CM | POA: Diagnosis not present

## 2019-05-11 DIAGNOSIS — Z23 Encounter for immunization: Secondary | ICD-10-CM | POA: Diagnosis not present

## 2019-05-31 DIAGNOSIS — M19041 Primary osteoarthritis, right hand: Secondary | ICD-10-CM | POA: Diagnosis not present

## 2019-05-31 DIAGNOSIS — I129 Hypertensive chronic kidney disease with stage 1 through stage 4 chronic kidney disease, or unspecified chronic kidney disease: Secondary | ICD-10-CM | POA: Diagnosis not present

## 2019-05-31 DIAGNOSIS — M19042 Primary osteoarthritis, left hand: Secondary | ICD-10-CM | POA: Diagnosis not present

## 2019-05-31 DIAGNOSIS — N183 Chronic kidney disease, stage 3 unspecified: Secondary | ICD-10-CM | POA: Diagnosis not present

## 2019-05-31 DIAGNOSIS — M1712 Unilateral primary osteoarthritis, left knee: Secondary | ICD-10-CM | POA: Diagnosis not present

## 2019-05-31 DIAGNOSIS — M1990 Unspecified osteoarthritis, unspecified site: Secondary | ICD-10-CM | POA: Diagnosis not present

## 2019-05-31 DIAGNOSIS — M810 Age-related osteoporosis without current pathological fracture: Secondary | ICD-10-CM | POA: Diagnosis not present

## 2019-06-08 ENCOUNTER — Encounter: Payer: Self-pay | Admitting: Physician Assistant

## 2019-06-08 ENCOUNTER — Other Ambulatory Visit: Payer: Self-pay | Admitting: Physician Assistant

## 2019-06-08 DIAGNOSIS — Z96652 Presence of left artificial knee joint: Secondary | ICD-10-CM

## 2019-06-08 DIAGNOSIS — M1711 Unilateral primary osteoarthritis, right knee: Secondary | ICD-10-CM | POA: Diagnosis not present

## 2019-06-08 NOTE — H&P (Signed)
TOTAL KNEE ADMISSION H&P  Patient is being admitted for right total knee arthroplasty.  Subjective:  Chief Complaint:right knee pain.  HPI: Shannon Mcintyre, 86 y.o. female, has a history of pain and functional disability in the right knee due to arthritis and has failed non-surgical conservative treatments for greater than 12 weeks to includeNSAID's and/or analgesics, corticosteriod injections, viscosupplementation injections, flexibility and strengthening excercises, supervised PT with diminished ADL's post treatment, use of assistive devices and activity modification.  Onset of symptoms was gradual, starting 10 years ago with gradually worsening course since that time. The patient noted prior procedures on the knee to include  arthroscopy and menisectomy on the right knee(s).  Patient currently rates pain in the right knee(s) at 10 out of 10 with activity. Patient has night pain, worsening of pain with activity and weight bearing, pain that interferes with activities of daily living, crepitus and joint swelling.  Patient has evidence of subchondral sclerosis, periarticular osteophytes and joint space narrowing by imaging studies.  There is no active infection.  Patient Active Problem List   Diagnosis Date Noted  . S/P total knee replacement, left   . Closed fracture of left olecranon process 07/28/2018  . Primary localized osteoarthritis of right knee   . Vocal cord atrophy 11/22/2013  . Hyperlipidemia 11/12/2011  . Dysphonia 02/18/2011  . Eczema 11/05/2010  . Essential hypertension 08/01/2009  . ARTHRITIS 03/29/2008  . DIVERTICULOSIS, COLON 09/17/2006   Past Medical History:  Diagnosis Date  . Arthritis   . Closed fracture of left olecranon process 07/28/2018  . Diverticulosis of colon   . Eczema   . Hypertension   . Left elbow fracture   . S/P total knee replacement, left     Past Surgical History:  Procedure Laterality Date  . ABDOMINAL HYSTERECTOMY  1980   BSO  .  APPENDECTOMY  1980  . knee arthoscopy  2010   right  . KNEE ARTHROSCOPY  2001   left  . ORIF ELBOW FRACTURE Left 07/28/2018   Procedure: OPEN REDUCTION INTERNAL FIXATION (ORIF) ELBOW/OLECRANON FRACTURE;  Surgeon: Marchia Bond, MD;  Location: [place];  Service: Orthopedics;  Laterality: Left;  . TOTAL KNEE ARTHROPLASTY Left 08/12/2016   Procedure: LEFT TOTAL KNEE ARTHROPLASTY;  Surgeon: Elsie Saas, MD;  Location: [place];  Service: Orthopedics;  Laterality: Left;    Current Outpatient Medications  Medication Sig Dispense Refill Last Dose  . Biotin 1000 MCG tablet Take 1,000 mcg by mouth 3 (three) times daily.     . Cholecalciferol (VITAMIN D3) 50 MCG (2000 UT) TABS Take 2,000 Units by mouth daily.     . folic acid (FOLVITE) 1 MG tablet Take 1 mg by mouth daily.     Marland Kitchen losartan (COZAAR) 25 MG tablet Take 25 mg by mouth daily.     . methotrexate (RHEUMATREX) 2.5 MG tablet Take 2.5 mg by mouth once a week. Caution:Chemotherapy. Protect from light. PATIENT TAKES 4TABS IN AM AND 4 TABS IN PM WEEKLY     . oxyCODONE (OXY IR/ROXICODONE) 5 MG immediate release tablet Take 1 tablet (5 mg total) by mouth every 6 (six) hours as needed for breakthrough pain. 15 tablet 0   . solifenacin (VESICARE) 5 MG tablet Take 5 mg by mouth daily.      No current facility-administered medications for this visit.   No Known Allergies  Social History   Tobacco Use  . Smoking status: Never Smoker  . Smokeless tobacco: Never Used  Substance Use  Topics  . Alcohol use: No    Family History  Problem Relation Age of Onset  . Pneumonia Mother 62  . Heart attack Father 59       Possibly CAD starting in his 39s  . Parkinson's disease Brother      Review of Systems  Constitutional: Positive for activity change.  HENT: Negative.   Eyes: Negative.   Respiratory: Negative.   Cardiovascular: Negative.   Gastrointestinal: Negative.   Endocrine: Negative.   Genitourinary: Negative.    Musculoskeletal: Positive for arthralgias, back pain, gait problem and joint swelling.  Skin: Negative.   Allergic/Immunologic: Negative.   Hematological: Negative.   Psychiatric/Behavioral: Negative.     Objective:  Physical Exam  Constitutional: She is oriented to person, place, and time. She appears well-developed and well-nourished.  HENT:  Head: Normocephalic and atraumatic.  Mouth/Throat: Oropharynx is clear and moist.  Eyes: Pupils are equal, round, and reactive to light. Conjunctivae are normal.  Cardiovascular: Normal rate, regular rhythm and normal heart sounds.  Respiratory: Effort normal and breath sounds normal.  GI: Soft. Bowel sounds are normal.  Genitourinary:    Genitourinary Comments: Not pertinent to current symptomatology therefore not examined.   Musculoskeletal:     Cervical back: Normal range of motion and neck supple.     Comments: Independently ambulatory with moderately antalgic gait.  Right knee range of motion -10 to 110 degrees.  Medial and lateral pain 2+ crepitus, 2+ synovitis, mild effusion.  Left knee has well healed total knee incision with range of motion 0-115 degrees.  Distal Neurovascular exam is intact.    Neurological: She is alert and oriented to person, place, and time.  Skin: Skin is warm and dry.  Psychiatric: She has a normal mood and affect. Her behavior is normal.    Vital signs in last 24 hours: Ht 5'5" Weight 141.2 lbs 187/68 70 bpm 97.8   Labs:   Estimated body mass index is 23.5 kg/m as calculated from the following:   Height as of this encounter: 5\' 5"  (1.651 m).   Weight as of this encounter: 64 kg.   Imaging Review Plain radiographs demonstrate severe degenerative joint disease of the right knee(s). The overall alignment issignificant varus. The bone quality appears to be good for age and reported activity level.      Assessment/Plan:  End stage arthritis, right knee  Primary localized osteoarthritis right  knee Hypertension Rheumatoid arthritis Osteoporosis Overactive bladder    The patient history, physical examination, clinical judgment of the provider and imaging studies are consistent with end stage degenerative joint disease of the right knee(s) and total knee arthroplasty is deemed medically necessary. The treatment options including medical management, injection therapy arthroscopy and arthroplasty were discussed at length. The risks and benefits of total knee arthroplasty were presented and reviewed. The risks due to aseptic loosening, infection, stiffness, patella tracking problems, thromboembolic complications and other imponderables were discussed. The patient acknowledged the explanation, agreed to proceed with the plan and consent was signed. Patient is being admitted for inpatient treatment for surgery, pain control, PT, OT, prophylactic antibiotics, VTE prophylaxis, progressive ambulation and ADL's and discharge planning. The patient is planning to be discharged home with home health services at [facility].       Patient's anticipated LOS is less than 2 midnights, meeting these requirements: - Younger than 25 - Lives within 1 hour of care - Has a competent adult at home to recover with post-op recover - NO history of  -  Chronic pain requiring opiods  - Diabetes  - Coronary Artery Disease  - Heart failure  - Heart attack  - Stroke  - DVT/VTE  - Cardiac arrhythmia  - Respiratory Failure/COPD  - Renal failure  - Anemia  - Advanced Liver disease

## 2019-06-16 NOTE — Progress Notes (Signed)
PCP - Marlana Latus, MD Cardiologist -   Chest x-ray -  EKG - 07-08-18 Stress Test -  ECHO -  Cardiac Cath -   Sleep Study -  CPAP -   Fasting Blood Sugar -  Checks Blood Sugar _____ times a day  Blood Thinner Instructions: Aspirin Instructions: Last Dose:  Anesthesia review:   Patient denies shortness of breath, fever, cough and chest pain at PAT appointment   Patient verbalized understanding of instructions that were given to them at the PAT appointment. Patient was also instructed that they will need to review over the PAT instructions again at home before surgery.

## 2019-06-16 NOTE — Patient Instructions (Addendum)
DUE TO COVID-19 ONLY ONE VISITOR IS ALLOWED TO COME WITH YOU AND STAY IN THE WAITING ROOM ONLY DURING PRE OP AND PROCEDURE DAY OF SURGERY. THE 1 VISITOR MAY VISIT WITH YOU AFTER SURGERY IN YOUR PRIVATE ROOM DURING VISITING HOURS ONLY!  YOU NEED TO HAVE A COVID 19 TEST ON 06-24-19@ 3:00 PM, THIS TEST MUST BE DONE BEFORE SURGERY, COME  [address], [place] , [address].  [facility]) ONCE YOUR COVID TEST IS COMPLETED, PLEASE BEGIN THE QUARANTINE INSTRUCTIONS AS OUTLINED IN YOUR HANDOUT.                Shannon Mcintyre  06/16/2019   Your procedure is scheduled on: 06-28-19    Report to [facility] Main  Entrance    Report to Short Stay at 5:30 AM     Call this number if you have problems the morning of surgery [phone]    Remember: NO SOLID FOOD AFTER MIDNIGHT THE NIGHT PRIOR TO SURGERY. NOTHING BY MOUTH EXCEPT CLEAR LIQUIDS UNTIL 4:30 AM . PLEASE FINISH ENSURE DRINK PER SURGEON ORDER  WHICH NEEDS TO BE COMPLETED AT 4:30 AM.   CLEAR LIQUID DIET   Foods Allowed                                                                     Foods Excluded  Coffee and tea, regular and decaf                             liquids that you cannot  Plain Jell-O any favor except red or purple                                           see through such as: Fruit ices (not with fruit pulp)                                     milk, soups, orange juice  Iced Popsicles                                    All solid food Carbonated beverages, regular and diet                                    Cranberry, grape and apple juices Sports drinks like Gatorade Lightly seasoned clear broth or consume(fat free) Sugar, honey syrup  _____________________________________________________________________     Take these medicines the morning of surgery with A SIP OF WATER: None  BRUSH YOUR TEETH MORNING OF SURGERY AND RINSE YOUR MOUTH OUT, NO CHEWING GUM CANDY OR MINTS.                                You may not have any metal on your body including hair pins and  piercings    Do not wear jewelry, make-up, lotions, powders or perfumes, deodorant             Do not wear nail polish on your fingernails.  Do not shave  48 hours prior to surgery.            Do not bring valuables to the hospital. Shannon Mcintyre.  Contacts, dentures or bridgework may not be worn into surgery.   You may bring a small overnight bag     Special Instructions: N/A              Please read over the following fact sheets you were given: _____________________________________________________________________             [facility] - Preparing for Surgery Before surgery, you can play an important role.  Because skin is not sterile, your skin needs to be as free of germs as possible.  You can reduce the number of germs on your skin by washing with CHG (chlorahexidine gluconate) soap before surgery.  CHG is an antiseptic cleaner which kills germs and bonds with the skin to continue killing germs even after washing. Please DO NOT use if you have an allergy to CHG or antibacterial soaps.  If your skin becomes reddened/irritated stop using the CHG and inform your nurse when you arrive at Short Stay. Do not shave (including legs and underarms) for at least 48 hours prior to the first CHG shower.  You may shave your face/neck. Please follow these instructions carefully:  1.  Shower with CHG Soap the night before surgery and the  morning of Surgery.  2.  If you choose to wash your hair, wash your hair first as usual with your  normal  shampoo.  3.  After you shampoo, rinse your hair and body thoroughly to remove the  shampoo.                           4.  Use CHG as you would any other liquid soap.  You can apply chg directly  to the skin and wash                       Gently with a scrungie or clean washcloth.  5.  Apply the CHG Soap to your body ONLY  FROM THE NECK DOWN.   Do not use on face/ open                           Wound or open sores. Avoid contact with eyes, ears mouth and genitals (private parts).                       Wash face,  Genitals (private parts) with your normal soap.             6.  Wash thoroughly, paying special attention to the area where your surgery  will be performed.  7.  Thoroughly rinse your body with warm water from the neck down.  8.  DO NOT shower/wash with your normal soap after using and rinsing off  the CHG Soap.                9.  Pat yourself dry with a clean towel.  10.  Wear clean pajamas.            11.  Place clean sheets on your bed the night of your first shower and do not  sleep with pets. Day of Surgery : Do not apply any lotions/deodorants the morning of surgery.  Please wear clean clothes to the hospital/surgery center.  FAILURE TO FOLLOW THESE INSTRUCTIONS MAY RESULT IN THE CANCELLATION OF YOUR SURGERY PATIENT SIGNATURE_________________________________  NURSE SIGNATURE__________________________________  ________________________________________________________________________   Shannon Mcintyre  An incentive spirometer is a tool that can help keep your lungs clear and active. This tool measures how well you are filling your lungs with each breath. Taking long deep breaths may help reverse or decrease the chance of developing breathing (pulmonary) problems (especially infection) following:  A long period of time when you are unable to move or be active. BEFORE THE PROCEDURE   If the spirometer includes an indicator to show your best effort, your nurse or respiratory therapist will set it to a desired goal.  If possible, sit up straight or lean slightly forward. Try not to slouch.  Hold the incentive spirometer in an upright position. INSTRUCTIONS FOR USE  1. Sit on the edge of your bed if possible, or sit up as far as you can in bed or on a chair. 2. Hold the incentive  spirometer in an upright position. 3. Breathe out normally. 4. Place the mouthpiece in your mouth and seal your lips tightly around it. 5. Breathe in slowly and as deeply as possible, raising the piston or the ball toward the top of the column. 6. Hold your breath for 3-5 seconds or for as long as possible. Allow the piston or ball to fall to the bottom of the column. 7. Remove the mouthpiece from your mouth and breathe out normally. 8. Rest for a few seconds and repeat Steps 1 through 7 at least 10 times every 1-2 hours when you are awake. Take your time and take a few normal breaths between deep breaths. 9. The spirometer may include an indicator to show your best effort. Use the indicator as a goal to work toward during each repetition. 10. After each set of 10 deep breaths, practice coughing to be sure your lungs are clear. If you have an incision (the cut made at the time of surgery), support your incision when coughing by placing a pillow or rolled up towels firmly against it. Once you are able to get out of bed, walk around indoors and cough well. You may stop using the incentive spirometer when instructed by your caregiver.  RISKS AND COMPLICATIONS  Take your time so you do not get dizzy or light-headed.  If you are in pain, you may need to take or ask for pain medication before doing incentive spirometry. It is harder to take a deep breath if you are having pain. AFTER USE  Rest and breathe slowly and easily.  It can be helpful to keep track of a log of your progress. Your caregiver can provide you with a simple table to help with this. If you are using the spirometer at home, follow these instructions: [place] IF:   You are having difficultly using the spirometer.  You have trouble using the spirometer as often as instructed.  Your pain medication is not giving enough relief while using the spirometer.  You develop fever of 100.5 F (38.1 C) or higher. SEEK  IMMEDIATE MEDICAL CARE IF:   You cough up bloody  sputum that had not been present before.  You develop fever of 102 F (38.9 C) or greater.  You develop worsening pain at or near the incision site. MAKE SURE YOU:   Understand these instructions.  Will watch your condition.  Will get help right away if you are not doing well or get worse. Document Released: 07/29/2006 Document Revised: 06/10/2011 Document Reviewed: 09/29/2006 [facility] Patient Information 2014 [place], [place].   ________________________________________________________________________

## 2019-06-18 ENCOUNTER — Other Ambulatory Visit: Payer: Self-pay

## 2019-06-18 ENCOUNTER — Encounter (HOSPITAL_COMMUNITY): Payer: Self-pay

## 2019-06-18 ENCOUNTER — Encounter (HOSPITAL_COMMUNITY)
Admission: RE | Admit: 2019-06-18 | Discharge: 2019-06-18 | Disposition: A | Discharge status: Home / Self Care | Payer: Medicare Other | Source: Ambulatory Visit | Attending: Orthopedic Surgery | Admitting: Orthopedic Surgery

## 2019-06-18 DIAGNOSIS — I129 Hypertensive chronic kidney disease with stage 1 through stage 4 chronic kidney disease, or unspecified chronic kidney disease: Secondary | ICD-10-CM | POA: Diagnosis not present

## 2019-06-18 DIAGNOSIS — N1831 Chronic kidney disease, stage 3a: Secondary | ICD-10-CM | POA: Diagnosis not present

## 2019-06-18 DIAGNOSIS — Z79899 Other long term (current) drug therapy: Secondary | ICD-10-CM | POA: Diagnosis not present

## 2019-06-18 DIAGNOSIS — M1711 Unilateral primary osteoarthritis, right knee: Secondary | ICD-10-CM | POA: Insufficient documentation

## 2019-06-18 DIAGNOSIS — M069 Rheumatoid arthritis, unspecified: Secondary | ICD-10-CM | POA: Insufficient documentation

## 2019-06-18 DIAGNOSIS — E785 Hyperlipidemia, unspecified: Secondary | ICD-10-CM | POA: Diagnosis not present

## 2019-06-18 DIAGNOSIS — I10 Essential (primary) hypertension: Secondary | ICD-10-CM | POA: Insufficient documentation

## 2019-06-18 DIAGNOSIS — Z01812 Encounter for preprocedural laboratory examination: Secondary | ICD-10-CM | POA: Insufficient documentation

## 2019-06-18 LAB — COMPREHENSIVE METABOLIC PANEL
ALT: 51 U/L — ABNORMAL HIGH (ref 0–44)
AST: 30 U/L (ref 15–41)
Albumin: 4.5 g/dL (ref 3.5–5.0)
Alkaline Phosphatase: 40 U/L (ref 38–126)
Anion gap: 7 (ref 5–15)
BUN: 23 mg/dL (ref 8–23)
CO2: 26 mmol/L (ref 22–32)
Calcium: 9.3 mg/dL (ref 8.9–10.3)
Chloride: 110 mmol/L (ref 98–111)
Creatinine, Ser: 0.89 mg/dL (ref 0.44–1.00)
GFR calc Af Amer: >60 mL/min (ref >60)
GFR calc non Af Amer: 60 mL/min — ABNORMAL LOW (ref >60)
Glucose, Bld: 108 mg/dL — ABNORMAL HIGH (ref 70–99)
Potassium: 4.7 mmol/L (ref 3.5–5.1)
Sodium: 143 mmol/L (ref 135–145)
Total Bilirubin: 0.8 mg/dL (ref 0.3–1.2)
Total Protein: 7 g/dL (ref 6.5–8.1)

## 2019-06-18 LAB — CBC WITH DIFFERENTIAL/PLATELET
Abs Immature Granulocytes: 0.03 10*3/uL (ref 0.00–0.07)
Basophils Absolute: 0 10*3/uL (ref 0.0–0.1)
Basophils Relative: 1 %
Eosinophils Absolute: 0.1 10*3/uL (ref 0.0–0.5)
Eosinophils Relative: 1 %
HCT: 35 % — ABNORMAL LOW (ref 36.0–46.0)
Hemoglobin: 11.6 g/dL — ABNORMAL LOW (ref 12.0–15.0)
Immature Granulocytes: 1 %
Lymphocytes Relative: 24 %
Lymphs Abs: 1.5 10*3/uL (ref 0.7–4.0)
MCH: 33 pg (ref 26.0–34.0)
MCHC: 33.1 g/dL (ref 30.0–36.0)
MCV: 99.4 fL (ref 80.0–100.0)
Monocytes Absolute: 0.5 10*3/uL (ref 0.1–1.0)
Monocytes Relative: 8 %
Neutro Abs: 4.4 10*3/uL (ref 1.7–7.7)
Neutrophils Relative %: 65 %
Platelets: 189 10*3/uL (ref 150–400)
RBC: 3.52 MIL/uL — ABNORMAL LOW (ref 3.87–5.11)
RDW: 15.4 % (ref 11.5–15.5)
WBC: 6.6 10*3/uL (ref 4.0–10.5)
nRBC: 0 % (ref 0.0–0.2)

## 2019-06-18 LAB — PROTIME-INR
INR: 1 (ref 0.8–1.2)
Prothrombin Time: 13.3 seconds (ref 11.4–15.2)

## 2019-06-18 LAB — SURGICAL PCR SCREEN
MRSA, PCR: NEGATIVE
Staphylococcus aureus: NEGATIVE

## 2019-06-18 LAB — APTT: aPTT: 29 seconds (ref 24–36)

## 2019-06-19 LAB — URINE CULTURE: Culture: NO GROWTH

## 2019-06-20 NOTE — H&P (Signed)
TOTAL KNEE ADMISSION H&P  Patient is being admitted for right total knee arthroplasty.  Subjective:  Chief Complaint:right knee pain.  HPI: Shannon Mcintyre, 86 y.o. female, has a history of pain and functional disability in the right knee due to arthritis and has failed non-surgical conservative treatments for greater than 12 weeks to includeNSAID's and/or analgesics, corticosteriod injections, viscosupplementation injections, flexibility and strengthening excercises, supervised PT with diminished ADL's post treatment, use of assistive devices and activity modification.  Onset of symptoms was gradual, starting 10 years ago with gradually worsening course since that time. The patient noted prior procedures on the knee to include  arthroscopy and menisectomy on the right knee(s).  Patient currently rates pain in the right knee(s) at 10 out of 10 with activity. Patient has night pain, worsening of pain with activity and weight bearing, pain that interferes with activities of daily living, crepitus and joint swelling.  Patient has evidence of subchondral sclerosis, periarticular osteophytes and joint space narrowing by imaging studies.  There is no active infection.  Patient Active Problem List   Diagnosis Date Noted  . S/P total knee replacement, left   . Closed fracture of left olecranon process 07/28/2018  . Primary localized osteoarthritis of right knee   . Vocal cord atrophy 11/22/2013  . Hyperlipidemia 11/12/2011  . Dysphonia 02/18/2011  . Eczema 11/05/2010  . Essential hypertension 08/01/2009  . ARTHRITIS 03/29/2008  . DIVERTICULOSIS, COLON 09/17/2006   Past Medical History:  Diagnosis Date  . Arthritis   . Closed fracture of left olecranon process 07/28/2018  . Diverticulosis of colon   . Eczema   . Hypertension   . Left elbow fracture   . S/P total knee replacement, left     Past Surgical History:  Procedure Laterality Date  . ABDOMINAL HYSTERECTOMY  1980   BSO  .  APPENDECTOMY  1980  . knee arthoscopy  2010   right  . KNEE ARTHROSCOPY  2001   left  . ORIF ELBOW FRACTURE Left 07/28/2018   Procedure: OPEN REDUCTION INTERNAL FIXATION (ORIF) ELBOW/OLECRANON FRACTURE;  Surgeon: Marchia Bond, MD;  Location: [facility];  Service: Orthopedics;  Laterality: Left;  . TOTAL KNEE ARTHROPLASTY Left 08/12/2016   Procedure: LEFT TOTAL KNEE ARTHROPLASTY;  Surgeon: Elsie Saas, MD;  Location: [facility];  Service: Orthopedics;  Laterality: Left;    No current facility-administered medications for this encounter.   Current Outpatient Medications  Medication Sig Dispense Refill Last Dose  . alendronate (FOSAMAX) 70 MG tablet Take 70 mg by mouth once a week.     . Biotin 1000 MCG tablet Take 1,000 mcg by mouth daily.      . Cholecalciferol (VITAMIN D3) 50 MCG (2000 UT) TABS Take 2,000 Units by mouth daily.     . Folic Acid 5 MG CAPS Take 5 mg by mouth daily.      Marland Kitchen losartan (COZAAR) 25 MG tablet Take 25 mg by mouth daily.     . methotrexate (RHEUMATREX) 2.5 MG tablet Take 15 mg by mouth every Friday. Caution:Chemotherapy. Protect from SUN     . solifenacin (VESICARE) 5 MG tablet Take 5 mg by mouth daily.      No Known Allergies  Social History   Tobacco Use  . Smoking status: Never Smoker  . Smokeless tobacco: Never Used  Substance Use Topics  . Alcohol use: No    Family History  Problem Relation Age of Onset  . Pneumonia Mother 28  . Heart attack Father  84       Possibly CAD starting in his 10s  . Parkinson's disease Brother      Review of Systems  Constitutional: Positive for activity change.  HENT: Negative.   Eyes: Negative.   Respiratory: Negative.   Cardiovascular: Negative.   Gastrointestinal: Negative.   Endocrine: Negative.   Genitourinary: Negative.   Musculoskeletal: Positive for arthralgias, back pain, gait problem and joint swelling.  Skin: Negative.   Allergic/Immunologic: Negative.   Hematological: Negative.    Psychiatric/Behavioral: Negative.     Objective:  Physical Exam  Constitutional: She is oriented to person, place, and time. She appears well-developed and well-nourished.  HENT:  Head: Normocephalic and atraumatic.  Mouth/Throat: Oropharynx is clear and moist.  Eyes: Pupils are equal, round, and reactive to light. Conjunctivae are normal.  Cardiovascular: Normal rate, regular rhythm and normal heart sounds.  Respiratory: Effort normal and breath sounds normal.  GI: Soft. Bowel sounds are normal.  Genitourinary:    Genitourinary Comments: Not pertinent to current symptomatology therefore not examined.   Musculoskeletal:     Cervical back: Normal range of motion and neck supple.     Comments: Independently ambulatory with moderately antalgic gait.  Right knee range of motion -10 to 110 degrees.  Medial and lateral pain 2+ crepitus, 2+ synovitis, mild effusion.  Left knee has well healed total knee incision with range of motion 0-115 degrees.  Distal Neurovascular exam is intact.    Neurological: She is alert and oriented to person, place, and time.  Skin: Skin is warm and dry.  Psychiatric: She has a normal mood and affect. Her behavior is normal.    Vital signs in last 24 hours: Ht 5'5" Weight 141.2 lbs 187/68 70 bpm 97.8   Labs:   Estimated body mass index is 24.37 kg/m as calculated from the following:   Height as of 06/18/19: 5\' 4"  (1.626 m).   Weight as of 06/18/19: 64.4 kg.   Imaging Review Plain radiographs demonstrate severe degenerative joint disease of the right knee(s). The overall alignment issignificant varus. The bone quality appears to be good for age and reported activity level.      Assessment/Plan:  End stage arthritis, right knee  Primary localized osteoarthritis right knee Hypertension Rheumatoid arthritis Osteoporosis Overactive bladder    The patient history, physical examination, clinical judgment of the provider and imaging studies are  consistent with end stage degenerative joint disease of the right knee(s) and total knee arthroplasty is deemed medically necessary. The treatment options including medical management, injection therapy arthroscopy and arthroplasty were discussed at length. The risks and benefits of total knee arthroplasty were presented and reviewed. The risks due to aseptic loosening, infection, stiffness, patella tracking problems, thromboembolic complications and other imponderables were discussed. The patient acknowledged the explanation, agreed to proceed with the plan and consent was signed. Patient is being admitted for inpatient treatment for surgery, pain control, PT, OT, prophylactic antibiotics, VTE prophylaxis, progressive ambulation and ADL's and discharge planning. The patient is planning to be discharged home with home health services at [facility].       Patient's anticipated LOS is less than 2 midnights, meeting these requirements: - Younger than 64 - Lives within 1 hour of care - Has a competent adult at home to recover with post-op recover - NO history of  - Chronic pain requiring opiods  - Diabetes  - Coronary Artery Disease  - Heart failure  - Heart attack  - Stroke  - DVT/VTE  -  Cardiac arrhythmia  - Respiratory Failure/COPD  - Renal failure  - Anemia  - Advanced Liver disease

## 2019-06-21 DIAGNOSIS — N1832 Chronic kidney disease, stage 3b: Secondary | ICD-10-CM | POA: Diagnosis not present

## 2019-06-21 DIAGNOSIS — I129 Hypertensive chronic kidney disease with stage 1 through stage 4 chronic kidney disease, or unspecified chronic kidney disease: Secondary | ICD-10-CM | POA: Diagnosis not present

## 2019-06-21 NOTE — Care Plan (Signed)
Ortho Bundle Case Management Note  Patient Details  Name: Shannon Mcintyre MRN: [id number] Date of Birth: December 12, 1932   Spoke with patient prior to surgery. She will discharge to the Wellness center at [facility]. Gilford Silvius is the contact there.  She is aware of the patient and expecting her. She will need an FL2 and probably transport.                  DME Arranged:  CPM, Walker rolling DME Agency:  [employer]  HH Arranged:    HH Agency:     Additional Comments: Please contact me with any questions of if this plan should need to change.    06/21/2019, 4:18 PM

## 2019-06-24 ENCOUNTER — Other Ambulatory Visit (HOSPITAL_COMMUNITY)
Admission: RE | Admit: 2019-06-24 | Discharge: 2019-06-24 | Disposition: A | Discharge status: Home / Self Care | Payer: Medicare Other | Source: Ambulatory Visit | Attending: Orthopedic Surgery | Admitting: Orthopedic Surgery

## 2019-06-24 DIAGNOSIS — Z01812 Encounter for preprocedural laboratory examination: Secondary | ICD-10-CM | POA: Diagnosis not present

## 2019-06-24 DIAGNOSIS — Z20822 Contact with and (suspected) exposure to covid-19: Secondary | ICD-10-CM | POA: Diagnosis not present

## 2019-06-24 LAB — SARS CORONAVIRUS 2 (TAT 6-24 HRS): SARS Coronavirus 2: NEGATIVE

## 2019-06-25 ENCOUNTER — Encounter (HOSPITAL_COMMUNITY): Payer: Self-pay | Admitting: Orthopedic Surgery

## 2019-06-25 NOTE — Anesthesia Preprocedure Evaluation (Addendum)
Anesthesia Evaluation  Patient identified by MRN, date of birth, ID band Patient awake    Reviewed: Allergy & Precautions, NPO status , Patient's Chart, lab work & pertinent test results  Airway Mallampati: III  TM Distance: >3 FB Neck ROM: Full    Dental  (+) Teeth Intact, Dental Advisory Given   Pulmonary neg pulmonary ROS,    breath sounds clear to auscultation       Cardiovascular hypertension, Pt. on medications  Rhythm:Regular Rate:Normal     Neuro/Psych negative neurological ROS  negative psych ROS   GI/Hepatic negative GI ROS, Neg liver ROS,   Endo/Other  negative endocrine ROS  Renal/GU negative Renal ROS     Musculoskeletal  (+) Arthritis ,   Abdominal Normal abdominal exam  (+)   Peds  Hematology negative hematology ROS (+)   Anesthesia Other Findings   Reproductive/Obstetrics                            Anesthesia Physical Anesthesia Plan  ASA: III  Anesthesia Plan: Spinal   Post-op Pain Management:  Regional for Post-op pain   Induction: Intravenous  PONV Risk Score and Plan: 3 and Ondansetron, Dexamethasone and Propofol infusion  Airway Management Planned: Natural Airway and Simple Face Mask  Additional Equipment: None  Intra-op Plan:   Post-operative Plan:   Informed Consent: I have reviewed the patients History and Physical, chart, labs and discussed the procedure including the risks, benefits and alternatives for the proposed anesthesia with the patient or authorized representative who has indicated his/her understanding and acceptance.       Plan Discussed with: CRNA  Anesthesia Plan Comments:        Anesthesia Quick Evaluation

## 2019-06-27 MED ORDER — BUPIVACAINE LIPOSOME 1.3 % IJ SUSP
20.0000 mL | Freq: Once | INTRAMUSCULAR | Status: DC
  Filled 2019-06-27: qty 20

## 2019-06-28 ENCOUNTER — Ambulatory Visit (HOSPITAL_COMMUNITY): Payer: Medicare Other | Admitting: Anesthesiology

## 2019-06-28 ENCOUNTER — Encounter (HOSPITAL_COMMUNITY): Payer: Self-pay | Admitting: Orthopedic Surgery

## 2019-06-28 ENCOUNTER — Other Ambulatory Visit: Payer: Self-pay

## 2019-06-28 ENCOUNTER — Observation Stay (HOSPITAL_COMMUNITY)
Admission: RE | Admit: 2019-06-28 | Discharge: 2019-06-29 | Disposition: A | Discharge status: Home / Self Care | Payer: Medicare Other | Attending: Orthopedic Surgery | Admitting: Orthopedic Surgery

## 2019-06-28 ENCOUNTER — Encounter (HOSPITAL_COMMUNITY)
Admission: RE | Disposition: A | Discharge status: Home / Self Care | Payer: Self-pay | Source: Home / Self Care | Attending: Orthopedic Surgery

## 2019-06-28 DIAGNOSIS — I10 Essential (primary) hypertension: Secondary | ICD-10-CM | POA: Insufficient documentation

## 2019-06-28 DIAGNOSIS — M1711 Unilateral primary osteoarthritis, right knee: Principal | ICD-10-CM | POA: Insufficient documentation

## 2019-06-28 DIAGNOSIS — Z96652 Presence of left artificial knee joint: Secondary | ICD-10-CM | POA: Insufficient documentation

## 2019-06-28 DIAGNOSIS — N3281 Overactive bladder: Secondary | ICD-10-CM | POA: Diagnosis not present

## 2019-06-28 DIAGNOSIS — Z79899 Other long term (current) drug therapy: Secondary | ICD-10-CM | POA: Diagnosis not present

## 2019-06-28 DIAGNOSIS — M810 Age-related osteoporosis without current pathological fracture: Secondary | ICD-10-CM | POA: Insufficient documentation

## 2019-06-28 DIAGNOSIS — E785 Hyperlipidemia, unspecified: Secondary | ICD-10-CM | POA: Insufficient documentation

## 2019-06-28 DIAGNOSIS — M069 Rheumatoid arthritis, unspecified: Secondary | ICD-10-CM | POA: Diagnosis not present

## 2019-06-28 DIAGNOSIS — Z7983 Long term (current) use of bisphosphonates: Secondary | ICD-10-CM | POA: Insufficient documentation

## 2019-06-28 DIAGNOSIS — L309 Dermatitis, unspecified: Secondary | ICD-10-CM | POA: Diagnosis not present

## 2019-06-28 DIAGNOSIS — G8918 Other acute postprocedural pain: Secondary | ICD-10-CM | POA: Diagnosis not present

## 2019-06-28 HISTORY — PX: TOTAL KNEE ARTHROPLASTY: SHX125

## 2019-06-28 SURGERY — ARTHROPLASTY, KNEE, TOTAL
Anesthesia: Spinal | Site: Knee | Laterality: Right

## 2019-06-28 MED ORDER — ROPIVACAINE HCL 7.5 MG/ML IJ SOLN
INTRAMUSCULAR | Status: DC | PRN
  Administered 2019-06-28: 20 mL via PERINEURAL

## 2019-06-28 MED ORDER — PROMETHAZINE HCL 25 MG/ML IJ SOLN: 6.2500 mg | INTRAMUSCULAR | Status: DC | PRN

## 2019-06-28 MED ORDER — ACETAMINOPHEN 10 MG/ML IV SOLN: 1000.0000 mg | Freq: Once | INTRAVENOUS | Status: DC | PRN

## 2019-06-28 MED ORDER — ACETAMINOPHEN 500 MG PO TABS
1000.0000 mg | ORAL_TABLET | Freq: Four times a day (QID) | ORAL | Status: AC
  Administered 2019-06-28 – 2019-06-29 (×4): 1000 mg via ORAL
  Filled 2019-06-28 (×4): qty 2

## 2019-06-28 MED ORDER — BUPIVACAINE LIPOSOME 1.3 % IJ SUSP
INTRAMUSCULAR | Status: DC | PRN
  Administered 2019-06-28: 20 mL

## 2019-06-28 MED ORDER — MIDAZOLAM HCL 2 MG/2ML IJ SOLN
INTRAMUSCULAR | Status: AC
  Filled 2019-06-28: qty 2

## 2019-06-28 MED ORDER — ACETAMINOPHEN 160 MG/5ML PO SOLN: 325.0000 mg | Freq: Once | ORAL | Status: DC | PRN

## 2019-06-28 MED ORDER — MEPERIDINE HCL 50 MG/ML IJ SOLN: 6.2500 mg | INTRAMUSCULAR | Status: DC | PRN

## 2019-06-28 MED ORDER — POVIDONE-IODINE 10 % EX SWAB
2.0000 "application " | Freq: Once | CUTANEOUS | Status: AC
  Administered 2019-06-28: 2 via TOPICAL

## 2019-06-28 MED ORDER — METOCLOPRAMIDE HCL 5 MG PO TABS: 5.0000 mg | ORAL_TABLET | Freq: Three times a day (TID) | ORAL | Status: DC | PRN

## 2019-06-28 MED ORDER — PROPOFOL 10 MG/ML IV BOLUS
INTRAVENOUS | Status: AC
  Filled 2019-06-28: qty 40

## 2019-06-28 MED ORDER — FENTANYL CITRATE (PF) 100 MCG/2ML IJ SOLN: 25.0000 ug | INTRAMUSCULAR | Status: DC | PRN

## 2019-06-28 MED ORDER — ASPIRIN EC 325 MG PO TBEC
325.0000 mg | DELAYED_RELEASE_TABLET | Freq: Every day | ORAL | Status: DC
  Administered 2019-06-29: 325 mg via ORAL
  Filled 2019-06-28: qty 1

## 2019-06-28 MED ORDER — METOCLOPRAMIDE HCL 5 MG/ML IJ SOLN: 5.0000 mg | Freq: Three times a day (TID) | INTRAMUSCULAR | Status: DC | PRN

## 2019-06-28 MED ORDER — LACTATED RINGERS IV SOLN: INTRAVENOUS | Status: DC

## 2019-06-28 MED ORDER — POVIDONE-IODINE 7.5 % EX SOLN: Freq: Once | CUTANEOUS | Status: DC

## 2019-06-28 MED ORDER — SODIUM CHLORIDE 0.9 % IV BOLUS
500.0000 mL | Freq: Once | INTRAVENOUS | Status: AC
  Administered 2019-06-28: 500 mL via INTRAVENOUS

## 2019-06-28 MED ORDER — ONDANSETRON HCL 4 MG/2ML IJ SOLN
INTRAMUSCULAR | Status: AC
  Filled 2019-06-28: qty 2

## 2019-06-28 MED ORDER — ALUM & MAG HYDROXIDE-SIMETH 200-200-20 MG/5ML PO SUSP: 30.0000 mL | ORAL | Status: DC | PRN

## 2019-06-28 MED ORDER — DOCUSATE SODIUM 100 MG PO CAPS
100.0000 mg | ORAL_CAPSULE | Freq: Two times a day (BID) | ORAL | Status: DC
  Administered 2019-06-28 – 2019-06-29 (×3): 100 mg via ORAL
  Filled 2019-06-28 (×3): qty 1

## 2019-06-28 MED ORDER — CEFUROXIME SODIUM 1.5 G IV SOLR
INTRAVENOUS | Status: AC
  Filled 2019-06-28: qty 1.5

## 2019-06-28 MED ORDER — POVIDONE-IODINE 10 % EX SWAB: 2.0000 "application " | Freq: Once | CUTANEOUS | Status: DC

## 2019-06-28 MED ORDER — SALINE FLUSH 0.9 % IV SOLN: INTRAVENOUS | Status: DC | PRN

## 2019-06-28 MED ORDER — WATER FOR IRRIGATION, STERILE IR SOLN
Status: DC | PRN
  Administered 2019-06-28: 2000 mL

## 2019-06-28 MED ORDER — GABAPENTIN 300 MG PO CAPS
300.0000 mg | ORAL_CAPSULE | Freq: Every day | ORAL | Status: DC
  Administered 2019-06-28: 300 mg via ORAL
  Filled 2019-06-28: qty 1

## 2019-06-28 MED ORDER — EPHEDRINE SULFATE-NACL 50-0.9 MG/10ML-% IV SOSY
PREFILLED_SYRINGE | INTRAVENOUS | Status: DC | PRN
  Administered 2019-06-28 (×4): 10 mg via INTRAVENOUS

## 2019-06-28 MED ORDER — PROPOFOL 500 MG/50ML IV EMUL
INTRAVENOUS | Status: DC | PRN
  Administered 2019-06-28: 35 ug/kg/min via INTRAVENOUS
  Administered 2019-06-28: 70 mg via INTRAVENOUS
  Administered 2019-06-28: 30 mg via INTRAVENOUS
  Administered 2019-06-28: 20 mg via INTRAVENOUS

## 2019-06-28 MED ORDER — CHLORHEXIDINE GLUCONATE 4 % EX LIQD: 60.0000 mL | Freq: Once | CUTANEOUS | Status: DC

## 2019-06-28 MED ORDER — POTASSIUM CHLORIDE IN NACL 20-0.9 MEQ/L-% IV SOLN
INTRAVENOUS | Status: DC
  Filled 2019-06-28 (×3): qty 1000

## 2019-06-28 MED ORDER — DARIFENACIN HYDROBROMIDE ER 7.5 MG PO TB24
7.5000 mg | ORAL_TABLET | Freq: Every day | ORAL | Status: DC
  Administered 2019-06-28 – 2019-06-29 (×2): 7.5 mg via ORAL
  Filled 2019-06-28 (×2): qty 1

## 2019-06-28 MED ORDER — PHENYLEPHRINE HCL (PRESSORS) 10 MG/ML IV SOLN
INTRAVENOUS | Status: AC
  Filled 2019-06-28: qty 1

## 2019-06-28 MED ORDER — PHENYLEPHRINE HCL-NACL 10-0.9 MG/250ML-% IV SOLN
INTRAVENOUS | Status: DC | PRN
  Administered 2019-06-28: 50 ug/min via INTRAVENOUS

## 2019-06-28 MED ORDER — DIPHENHYDRAMINE HCL 12.5 MG/5ML PO ELIX: 12.5000 mg | ORAL_SOLUTION | ORAL | Status: DC | PRN

## 2019-06-28 MED ORDER — TRANEXAMIC ACID-NACL 1000-0.7 MG/100ML-% IV SOLN
INTRAVENOUS | Status: AC
  Filled 2019-06-28: qty 100

## 2019-06-28 MED ORDER — FENTANYL CITRATE (PF) 100 MCG/2ML IJ SOLN
INTRAMUSCULAR | Status: AC
  Filled 2019-06-28: qty 2

## 2019-06-28 MED ORDER — MENTHOL 3 MG MT LOZG: 1.0000 | LOZENGE | OROMUCOSAL | Status: DC | PRN

## 2019-06-28 MED ORDER — DEXAMETHASONE SODIUM PHOSPHATE 10 MG/ML IJ SOLN
10.0000 mg | Freq: Three times a day (TID) | INTRAMUSCULAR | Status: DC
  Administered 2019-06-28 – 2019-06-29 (×3): 10 mg via INTRAVENOUS
  Filled 2019-06-28 (×3): qty 1

## 2019-06-28 MED ORDER — CEFAZOLIN SODIUM-DEXTROSE 2-4 GM/100ML-% IV SOLN
2.0000 g | Freq: Four times a day (QID) | INTRAVENOUS | Status: AC
  Administered 2019-06-28 (×2): 2 g via INTRAVENOUS
  Filled 2019-06-28 (×2): qty 100

## 2019-06-28 MED ORDER — CEFAZOLIN SODIUM-DEXTROSE 2-4 GM/100ML-% IV SOLN
INTRAVENOUS | Status: AC
  Filled 2019-06-28: qty 100

## 2019-06-28 MED ORDER — ONDANSETRON HCL 4 MG/2ML IJ SOLN
INTRAMUSCULAR | Status: DC | PRN
  Administered 2019-06-28: 4 mg via INTRAVENOUS

## 2019-06-28 MED ORDER — BUPIVACAINE HCL (PF) 0.25 % IJ SOLN
INTRAMUSCULAR | Status: AC
  Filled 2019-06-28: qty 30

## 2019-06-28 MED ORDER — DEXAMETHASONE SODIUM PHOSPHATE 10 MG/ML IJ SOLN
8.0000 mg | Freq: Once | INTRAMUSCULAR | Status: AC
  Administered 2019-06-28: 08:00:00 5 mg via INTRAVENOUS

## 2019-06-28 MED ORDER — FENTANYL CITRATE (PF) 100 MCG/2ML IJ SOLN
INTRAMUSCULAR | Status: DC | PRN
  Administered 2019-06-28 (×3): 25 ug via INTRAVENOUS

## 2019-06-28 MED ORDER — ONDANSETRON HCL 4 MG/2ML IJ SOLN: 4.0000 mg | Freq: Four times a day (QID) | INTRAMUSCULAR | Status: DC | PRN

## 2019-06-28 MED ORDER — 0.9 % SODIUM CHLORIDE (POUR BTL) OPTIME
TOPICAL | Status: DC | PRN
  Administered 2019-06-28: 1000 mL

## 2019-06-28 MED ORDER — POLYETHYLENE GLYCOL 3350 17 G PO PACK
17.0000 g | PACK | Freq: Two times a day (BID) | ORAL | Status: DC
  Administered 2019-06-28 – 2019-06-29 (×3): 17 g via ORAL
  Filled 2019-06-28 (×3): qty 1

## 2019-06-28 MED ORDER — EPHEDRINE 5 MG/ML INJ
INTRAVENOUS | Status: AC
  Filled 2019-06-28: qty 10

## 2019-06-28 MED ORDER — OXYCODONE HCL 5 MG PO TABS
5.0000 mg | ORAL_TABLET | ORAL | Status: DC | PRN
  Administered 2019-06-28: 5 mg via ORAL
  Filled 2019-06-28: qty 1

## 2019-06-28 MED ORDER — PHENYLEPHRINE 40 MCG/ML (10ML) SYRINGE FOR IV PUSH (FOR BLOOD PRESSURE SUPPORT)
PREFILLED_SYRINGE | INTRAVENOUS | Status: DC | PRN
  Administered 2019-06-28 (×2): 80 ug via INTRAVENOUS

## 2019-06-28 MED ORDER — SODIUM CHLORIDE (PF) 0.9 % IJ SOLN
INTRAMUSCULAR | Status: AC
  Filled 2019-06-28: qty 50

## 2019-06-28 MED ORDER — PHENOL 1.4 % MT LIQD: 1.0000 | OROMUCOSAL | Status: DC | PRN

## 2019-06-28 MED ORDER — ONDANSETRON HCL 4 MG PO TABS: 4.0000 mg | ORAL_TABLET | Freq: Four times a day (QID) | ORAL | Status: DC | PRN

## 2019-06-28 MED ORDER — PROPOFOL 1000 MG/100ML IV EMUL
INTRAVENOUS | Status: AC
  Filled 2019-06-28: qty 100

## 2019-06-28 MED ORDER — PHENYLEPHRINE 40 MCG/ML (10ML) SYRINGE FOR IV PUSH (FOR BLOOD PRESSURE SUPPORT)
PREFILLED_SYRINGE | INTRAVENOUS | Status: AC
  Filled 2019-06-28: qty 10

## 2019-06-28 MED ORDER — CEFAZOLIN SODIUM-DEXTROSE 2-4 GM/100ML-% IV SOLN
2.0000 g | INTRAVENOUS | Status: AC
  Administered 2019-06-28: 2 g via INTRAVENOUS

## 2019-06-28 MED ORDER — ACETAMINOPHEN 325 MG PO TABS: 325.0000 mg | ORAL_TABLET | Freq: Once | ORAL | Status: DC | PRN

## 2019-06-28 MED ORDER — TRANEXAMIC ACID-NACL 1000-0.7 MG/100ML-% IV SOLN
1000.0000 mg | INTRAVENOUS | Status: AC
  Administered 2019-06-28: 08:00:00 1000 mg via INTRAVENOUS

## 2019-06-28 MED ORDER — BUPIVACAINE IN DEXTROSE 0.75-8.25 % IT SOLN
INTRATHECAL | Status: DC | PRN
  Administered 2019-06-28: 1.4 mL via INTRATHECAL

## 2019-06-28 MED ORDER — DEXAMETHASONE SODIUM PHOSPHATE 10 MG/ML IJ SOLN
INTRAMUSCULAR | Status: AC
  Filled 2019-06-28: qty 1

## 2019-06-28 MED ORDER — BUPIVACAINE HCL 0.25 % IJ SOLN
INTRAMUSCULAR | Status: DC | PRN
  Administered 2019-06-28: 30 mL

## 2019-06-28 MED ORDER — SODIUM CHLORIDE (PF) 0.9 % IJ SOLN
INTRAMUSCULAR | Status: DC | PRN
  Administered 2019-06-28: 50 mL via INTRAVENOUS

## 2019-06-28 SURGICAL SUPPLY — 70 items
APL PRP STRL LF DISP 70% ISPRP (MISCELLANEOUS) ×2
ATTUNE MED DOME PAT 32 KNEE (Knees) ×1 IMPLANT
ATTUNE MED DOME PAT 32MM KNEE (Knees) ×1 IMPLANT
ATTUNE PS FEM RT SZ 6 CEM KNEE (Femur) ×2 IMPLANT
ATTUNE PSRP INSR SZ6 5 KNEE (Insert) ×1 IMPLANT
ATTUNE PSRP INSR SZ6 5MM KNEE (Insert) ×1 IMPLANT
BAG SPEC THK2 15X12 ZIP CLS (MISCELLANEOUS) ×1
BAG ZIPLOCK 12X15 (MISCELLANEOUS) ×3 IMPLANT
BASE TIBIA ATTUNE KNEE SYS SZ6 (Knees) IMPLANT
BLADE HEX COATED 2.75 (ELECTRODE) ×3 IMPLANT
BLADE SAGITTAL 25.0X1.19X90 (BLADE) ×2 IMPLANT
BLADE SAGITTAL 25.0X1.19X90MM (BLADE) ×1
BLADE SAW SGTL 13X75X1.27 (BLADE) ×3 IMPLANT
BLADE SURG SZ10 CARB STEEL (BLADE) ×6 IMPLANT
BNDG CMPR MED 10X6 ELC LF (GAUZE/BANDAGES/DRESSINGS) ×1
BNDG ELASTIC 6X10 VLCR STRL LF (GAUZE/BANDAGES/DRESSINGS) ×2 IMPLANT
BOWL SMART MIX CTS (DISPOSABLE) ×3 IMPLANT
BSPLAT TIB 6 CMNT ROT PLAT STR (Knees) ×1 IMPLANT
CEMENT HV SMART SET (Cement) ×4 IMPLANT
CHLORAPREP W/TINT 26 (MISCELLANEOUS) ×6 IMPLANT
CLOSURE STERI-STRIP 1/2X4 (GAUZE/BANDAGES/DRESSINGS) ×1
CLOSURE WOUND 1/2 X4 (GAUZE/BANDAGES/DRESSINGS) ×1
CLSR STERI-STRIP ANTIMIC 1/2X4 (GAUZE/BANDAGES/DRESSINGS) ×1 IMPLANT
COVER SURGICAL LIGHT HANDLE (MISCELLANEOUS) ×3 IMPLANT
COVER WAND RF STERILE (DRAPES) IMPLANT
CUFF TOURN SGL QUICK 34 (TOURNIQUET CUFF) ×3
CUFF TRNQT CYL 34X4.125X (TOURNIQUET CUFF) ×1 IMPLANT
DECANTER SPIKE VIAL GLASS SM (MISCELLANEOUS) ×6 IMPLANT
DRAPE ORTHO SPLIT 77X108 STRL (DRAPES) ×3
DRAPE SHEET LG 3/4 BI-LAMINATE (DRAPES) ×3 IMPLANT
DRAPE SURG ORHT 6 SPLT 77X108 (DRAPES) ×1 IMPLANT
DRAPE U-SHAPE 47X51 STRL (DRAPES) ×3 IMPLANT
DRSG AQUACEL AG ADV 3.5X10 (GAUZE/BANDAGES/DRESSINGS) ×3 IMPLANT
DRSG PAD ABDOMINAL 8X10 ST (GAUZE/BANDAGES/DRESSINGS) IMPLANT
ELECT REM PT RETURN 15FT ADLT (MISCELLANEOUS) ×3 IMPLANT
GLOVE BIO SURGEON STRL SZ7 (GLOVE) ×3 IMPLANT
GLOVE BIOGEL PI IND STRL 7.0 (GLOVE) ×1 IMPLANT
GLOVE BIOGEL PI IND STRL 7.5 (GLOVE) ×1 IMPLANT
GLOVE BIOGEL PI INDICATOR 7.0 (GLOVE) ×2
GLOVE BIOGEL PI INDICATOR 7.5 (GLOVE) ×2
GLOVE SS BIOGEL STRL SZ 7.5 (GLOVE) ×1 IMPLANT
GLOVE SUPERSENSE BIOGEL SZ 7.5 (GLOVE) ×2
GOWN STRL REUS W/ TWL LRG LVL3 (GOWN DISPOSABLE) ×2 IMPLANT
GOWN STRL REUS W/TWL LRG LVL3 (GOWN DISPOSABLE) ×6
HANDPIECE INTERPULSE COAX TIP (DISPOSABLE) ×3
HOLDER FOLEY CATH W/STRAP (MISCELLANEOUS) IMPLANT
HOOD PEEL AWAY FLYTE STAYCOOL (MISCELLANEOUS) ×9 IMPLANT
KIT TURNOVER KIT A (KITS) ×3 IMPLANT
MANIFOLD NEPTUNE II (INSTRUMENTS) ×3 IMPLANT
MARKER SKIN DUAL TIP RULER LAB (MISCELLANEOUS) ×3 IMPLANT
NEEDLE HYPO 22GX1.5 SAFETY (NEEDLE) ×3 IMPLANT
NS IRRIG 1000ML POUR BTL (IV SOLUTION) ×3 IMPLANT
PACK TOTAL KNEE CUSTOM (KITS) ×3 IMPLANT
PENCIL SMOKE EVACUATOR (MISCELLANEOUS) IMPLANT
PIN DRILL FIX HALF THREAD (BIT) ×2 IMPLANT
PIN STEINMAN FIXATION KNEE (PIN) ×2 IMPLANT
PROTECTOR NERVE ULNAR (MISCELLANEOUS) ×3 IMPLANT
SET HNDPC FAN SPRY TIP SCT (DISPOSABLE) ×1 IMPLANT
STAPLER VISISTAT 35W (STAPLE) IMPLANT
STRIP CLOSURE SKIN 1/2X4 (GAUZE/BANDAGES/DRESSINGS) ×2 IMPLANT
SUT MNCRL AB 3-0 PS2 18 (SUTURE) ×3 IMPLANT
SUT VIC AB 0 CT1 36 (SUTURE) ×6 IMPLANT
SUT VIC AB 1 CT1 36 (SUTURE) ×3 IMPLANT
SUT VIC AB 2-0 CT1 27 (SUTURE) ×6
SUT VIC AB 2-0 CT1 TAPERPNT 27 (SUTURE) ×2 IMPLANT
SYR CONTROL 10ML LL (SYRINGE) ×6 IMPLANT
TIBIA ATTUNE KNEE SYS BASE SZ6 (Knees) ×3 IMPLANT
TRAY FOLEY MTR SLVR 14FR STAT (SET/KITS/TRAYS/PACK) ×3 IMPLANT
WATER STERILE IRR 1000ML POUR (IV SOLUTION) ×6 IMPLANT
YANKAUER SUCT BULB TIP 10FT TU (MISCELLANEOUS) ×3 IMPLANT

## 2019-06-28 NOTE — Anesthesia Procedure Notes (Signed)
Procedure Name: LMA Insertion Date/Time: 06/28/2019 7:50 AM Performed by: Wynonia Sours, CRNA Pre-anesthesia Checklist: Patient identified, Emergency Drugs available, Suction available, Patient being monitored and Timeout performed Patient Re-evaluated:Patient Re-evaluated prior to induction Oxygen Delivery Method: Circle system utilized Preoxygenation: Pre-oxygenation with 100% oxygen Induction Type: IV induction LMA: LMA inserted LMA Size: 4.0 Number of attempts: 1 Placement Confirmation: positive ETCO2 and breath sounds checked- equal and bilateral Tube secured with: Tape Dental Injury: Teeth and Oropharynx as per pre-operative assessment

## 2019-06-28 NOTE — Anesthesia Procedure Notes (Signed)
Spinal  Start time: 06/28/2019 7:25 AM End time: 06/28/2019 7:26 AM Staffing Performed: anesthesiologist  Anesthesiologist: Shelton Silvas, MD Preanesthetic Checklist Completed: patient identified, IV checked, site marked, risks and benefits discussed, surgical consent, monitors and equipment checked, pre-op evaluation and timeout performed Spinal Block Patient position: sitting Prep: DuraPrep and site prepped and draped Location: L3-4 Injection technique: single-shot Needle Needle type: Pencan  Needle gauge: 24 G Needle length: 10 cm Needle insertion depth: 10 cm Additional Notes Patient tolerated well. No immediate complications.

## 2019-06-28 NOTE — Anesthesia Postprocedure Evaluation (Signed)
Anesthesia Post Note  Patient: Shannon Mcintyre  Procedure(s) Performed: TOTAL KNEE ARTHROPLASTY (Right Knee)     Patient location during evaluation: PACU Anesthesia Type: General and Spinal Level of consciousness: oriented and awake and alert Pain management: pain level controlled Vital Signs Assessment: post-procedure vital signs reviewed and stable Respiratory status: spontaneous breathing, respiratory function stable and patient connected to nasal cannula oxygen Cardiovascular status: blood pressure returned to baseline and stable Postop Assessment: no headache, no backache and no apparent nausea or vomiting Anesthetic complications: no    Last Vitals:  Vitals:   06/28/19 0945 06/28/19 1000  BP: (!) 114/48 126/69  Pulse: 71 72  Resp: 12 17  Temp:  (!) 36.3 C  SpO2: 100% 100%    Last Pain:  Vitals:   06/28/19 1000  TempSrc:   PainSc: 0-No pain                 Shelton Silvas

## 2019-06-28 NOTE — Transfer of Care (Signed)
Immediate Anesthesia Transfer of Care Note  Patient: Shannon Mcintyre  Procedure(s) Performed: TOTAL KNEE ARTHROPLASTY (Right Knee)  Patient Location: PACU  Anesthesia Type:GA combined with regional for post-op pain  Level of Consciousness: awake, oriented, drowsy and patient cooperative  Airway & Oxygen Therapy: Patient Spontanous Breathing and Patient connected to face mask oxygen  Post-op Assessment: Report given to RN and Post -op Vital signs reviewed and stable  Post vital signs: Reviewed and stable  Last Vitals:  Vitals Value Taken Time  BP 108/89 06/28/19 0922  Temp    Pulse 77 06/28/19 0927  Resp 23 06/28/19 0927  SpO2 100 % 06/28/19 0927  Vitals shown include unvalidated device data.  Last Pain:  Vitals:   06/28/19 0646  TempSrc:   PainSc: 0-No pain         Complications: No apparent anesthesia complications

## 2019-06-28 NOTE — Op Note (Signed)
MRN:     [id number] DOB/AGE:    1933-01-17 / 86 y.o.       OPERATIVE REPORT   DATE OF PROCEDURE:  06/28/2019      PREOPERATIVE DIAGNOSIS:   Primary Localized Osteoarthritis right Knee       Estimated body mass index is 24.37 kg/m as calculated from the following:   Height as of this encounter: 5\' 4"  (1.626 m).   Weight as of this encounter: 64.4 kg.                                                       POSTOPERATIVE DIAGNOSIS:   Same                                                                 PROCEDURE:  Procedure(s): TOTAL KNEE ARTHROPLASTY Using Depuy Attune RP implants #6 Femur, #6Tibia, 14mm  RP bearing, 32 Patella    SURGEON: Porshea Janowski A. 4m, MD   ASSISTANT: Thurston Hole, PA-C, present and scrubbed throughout the case, critical for retraction, instrumentation, and closure.  ANESTHESIA: Spinal with Adductor Nerve Block  TOURNIQUET TIME: 52 minutes   COMPLICATIONS:  None       SPECIMENS: None   INDICATIONS FOR PROCEDURE: The patient has djd of the knee with varus deformities, XR shows bone on bone arthritis. Patient has failed all conservative measures including anti-inflammatory medicines, narcotics, attempts at exercise and weight loss, cortisone injections and viscosupplementation.  Risks and benefits of surgery have been discussed, questions answered.    DESCRIPTION OF PROCEDURE: The patient identified by armband, received right adductor canal block and IV antibiotics, in the holding area at [facility]. Patient taken to the operating room, appropriate anesthetic monitors were attached. Spinal anesthesia induced with the patient in supine position, Foley catheter was inserted. Tourniquet applied high to the operative thigh. Lateral post and foot positioner applied to the table, the lower extremity was then prepped and draped in usual sterile fashion from the ankle to the tourniquet. Time-out procedure was performed. The limb was wrapped with an Esmarch bandage and the  tourniquet inflated to 365 mmHg.   We began the operation by making a 6cm anterior midline incision. Small bleeders in the skin and the subcutaneous tissue identified and cauterized. Transverse retinaculum was incised and reflected medially and a medial parapatellar arthrotomy was accomplished. the patella was everted and theprepatellar fat pad resected. The superficial medial collateral ligament was then elevated from anterior to posterior along the proximal flare of the tibia and anterior half of the menisci resected. The knee was hyperflexed exposing bone on bone arthritis. Peripheral and notch osteophytes as well as the cruciate ligaments were then resected. We continued to work our way around posteriorly along the proximal tibia, and externally rotated the tibia subluxing it out from underneath the femur. A McHale retractor was placed through the notch and a lateral Hohmann retractor placed, and an external tibial guide was placed.  The tibial cutting guide was pinned into place allowing resection of 4 mm of bone medially and about 6 mm of bone laterally because of her varus  deformity.   Satisfied with the tibial resection, we then entered the distal femur 2 mm anterior to the PCL origin with the intramedullary guide rod and applied the distal femoral cutting guide set at 1mm, with 5 degrees of valgus. This was pinned along the epicondylar axis. At this point, the distal femoral cut was accomplished without difficulty. We then sized for a 6 femoral component and pinned the guide in 3 degrees of external rotation.The chamfer cutting guide was pinned into place. The anterior, posterior, and chamfer cuts were accomplished without difficulty followed by the  RP box cutting guide and the box cut. We also removed posterior osteophytes from the posterior femoral condyles. At this time, the knee was brought into full extension. We checked our extension and flexion gaps and found them symmetric at 5.  The patella  thickness measured at 31m m. We set the cutting guide at 15 and removed the posterior patella sized for 32 button and drilled the lollipop. The knee was then once again hyperflexed exposing the proximal tibia. We sized for a # 6 tibial base plate, applied the smokestack and the conical reamer followed by the the Delta fin keel punch. We then hammered into place the  RP trial femoral component, inserted a trial bearing, trial patellar button, and took the knee through range of motion from 0-130 degrees. No thumb pressure was required for patellar tracking.   At this point, all trial components were removed, a double batch of DePuy HV cement  was mixed and applied to all bony metallic mating surfaces. In order, we hammered into place the tibial tray and removed excess cement, the femoral component and removed excess cement, a 5 mm  RP bearing was inserted, and the knee brought to full extension with compression. The patellar button was clamped into place, and excess cement removed. While the cement cured the wound was irrigated out with normal saline solution pulse lavage, and exparel was injected throughout the knee. Ligament stability and patellar tracking were checked and found to be excellent..   The parapatellar arthrotomy was closed with  #1 Vicryl suture. The subcutaneous tissue with 0 and 2-0 undyed Vicryl suture, and 4-0 Monocryl.. A dressing of Aquaseal, 4 x 4, dressing sponges, Webril, and Ace wrap applied. Needle and sponge count were correct times 2.The patient awakened, extubated, and taken to recovery room without difficulty. Vascular status was normal, pulses 2+ and symmetric.    Lorn Junes 06/23/2017, 8:56 AM

## 2019-06-28 NOTE — Interval H&P Note (Signed)
History and Physical Interval Note:  06/28/2019 6:32 AM  Shannon Mcintyre  has presented today for surgery, with the diagnosis of djd ight knee.  The various methods of treatment have been discussed with the patient and family. After consideration of risks, benefits and other options for treatment, the patient has consented to  Procedure(s): TOTAL KNEE ARTHROPLASTY (Right) as a surgical intervention.  The patient's history has been reviewed, patient examined, no change in status, stable for surgery.  I have reviewed the patient's chart and labs.  Questions were answered to the patient's satisfaction.     Nilda Simmer

## 2019-06-28 NOTE — Anesthesia Procedure Notes (Signed)
Anesthesia Regional Block: Adductor canal block   Pre-Anesthetic Checklist: ,, timeout performed, Correct Patient, Correct Site, Correct Laterality, Correct Procedure, Correct Position, site marked, Risks and benefits discussed,  Surgical consent,  Pre-op evaluation,  At surgeon's request and post-op pain management  Laterality: Right  Prep: chloraprep       Needles:  Injection technique: Single-shot  Needle Type: Echogenic Stimulator Needle     Needle Length: 9cm  Needle Gauge: 21     Additional Needles:   Procedures:,,,, ultrasound used (permanent image in chart),,,,  Narrative:  Start time: 06/28/2019 7:05 AM End time: 06/28/2019 7:10 AM Injection made incrementally with aspirations every 5 mL.  Performed by: Personally  Anesthesiologist: Shelton Silvas, MD  Additional Notes: Patient tolerated the procedure well. Local anesthetic introduced in an incremental fashion under minimal resistance after negative aspirations. No paresthesias were elicited. After completion of the procedure, no acute issues were identified and patient continued to be monitored by RN.

## 2019-06-28 NOTE — Evaluation (Signed)
Physical Therapy Evaluation Patient Details Name: Shannon Mcintyre MRN: [id number] DOB: 1932/09/01 Today's Date: 06/28/2019   History of Present Illness  Patient is 86 y.o. female s/p Rt TKA on 06/28/19 with PMH significant for lt TKA, HTN, OA, Lt elbow fracture s/p ORIF on 07/28/18.    Clinical Impression  Shannon Mcintyre is a 86 y.o. female POD 0 s/p Rt TKA. Patient reports independence with mobility at baseline. Patient is now limited by functional impairments (see PT problem list below) and requires min assist for transfers and gait with RW. Patient was able to ambulate ~8 feet with RW and min assist. Patient was limited by pain and poor quad activation and slight buckling on Rt LE. Patient instructed in exercise to facilitate ROM and circulation to manage edema. Patient will benefit from continued skilled PT interventions to address impairments and progress towards PLOF. Acute PT will follow to progress mobility and stair training in preparation for safe discharge home.     Follow Up Recommendations Follow surgeon's recommendation for DC plan and follow-up therapies;Home health PT([facility]PT at [facility])    Equipment Recommendations  Rolling walker with 5" wheels    Recommendations for Other Services       Precautions / Restrictions Precautions Precautions: Fall Restrictions Weight Bearing Restrictions: No      Mobility  Bed Mobility Overal bed mobility: Needs Assistance Bed Mobility: Supine to Sit     Supine to sit: HOB elevated;Min guard     General bed mobility comments: cues for use of bed rail and to scoot forward to EOB. no assist need to raise trunk (HOB elevated) or to walk LE's to EOB.  Transfers Overall transfer level: Needs assistance Equipment used: Rolling walker (2 wheeled) Transfers: Sit to/from [employer] Sit to Stand: Min assist;From elevated surface Stand pivot transfers: Min assist;From elevated surface       General transfer  comment: cues for safe hand placement/technique with RW, assist required to initiate power up from EOB, assist to steady with rising and facilitate Rt knee extension in weight bearing to prevent buckling.   Ambulation/Gait Ambulation/Gait assistance: Min assist Gait Distance (Feet): 8 Feet Assistive device: Rolling walker (2 wheeled) Gait Pattern/deviations: Step-to pattern;Decreased stride length;Decreased step length - right;Decreased step length - left;Decreased stance time - right;Decreased weight shift to right Gait velocity: decreased   General Gait Details: cues for safe step pattern and proximity to RW. manual facilitation for Rt knee extension to prevent buckling in stance phase. pt improved use of UE's to prevent buckling with practice however was limited by pain. pt taking small steps forward and backward after stand step transfer.  Stairs       Wheelchair Mobility    Modified Rankin (Stroke Patients Only)       Balance Overall balance assessment: Needs assistance Sitting-balance support: Feet supported Sitting balance-Leahy Scale: Good     Standing balance support: During functional activity;Bilateral upper extremity supported Standing balance-Leahy Scale: Poor              Pertinent Vitals/Pain Pain Assessment: 0-10 Pain Score: 3  Pain Location: Rt knee Pain Descriptors / Indicators: Aching;Discomfort;Sore Pain Intervention(s): Limited activity within patient's tolerance;Monitored during session;Repositioned    Home Living Family/patient expects to be discharged to:: Private residence([facility] apartment) Living Arrangements: Spouse/significant other Available Help at Discharge: Family Type of Home: Apartment Home Access: Stairs to enter   Secretary/administrator of Steps: 3 Home Layout: One level Home Equipment: Cane - single point;Grab bars - tub/shower;Shower  seat - built in Additional Comments: two daughter live 2-3 miles away, pt moved    Prior  Function Level of Independence: Independent               Hand Dominance   Dominant Hand: Right    Extremity/Trunk Assessment   Upper Extremity Assessment Upper Extremity Assessment: Overall WFL for tasks assessed    Lower Extremity Assessment Lower Extremity Assessment: Generalized weakness;RLE deficits/detail RLE Deficits / Details: pt with slight extensor lag with SLR, 3+/5 for quad strength with MMT, limitedby pain. some buckling in weight bearing.    Cervical / Trunk Assessment Cervical / Trunk Assessment: Normal  Communication   Communication: No difficulties  Cognition Arousal/Alertness: Awake/alert Behavior During Therapy: WFL for tasks assessed/performed Overall Cognitive Status: Within Functional Limits for tasks assessed           General Comments      Exercises Total Joint Exercises Ankle Circles/Pumps: AROM;Both;10 reps;Seated Quad Sets: AROM;Right;Seated;10 reps Heel Slides: Right;AAROM;Seated;10 reps   Assessment/Plan    PT Assessment Patient needs continued PT services  PT Problem List Decreased strength;Decreased activity tolerance;Decreased range of motion;Decreased balance;Decreased mobility;Decreased knowledge of use of DME       PT Treatment Interventions Gait training;DME instruction;Stair training;Functional mobility training;Therapeutic activities;Therapeutic exercise;Balance training;Patient/family education    PT Goals (Current goals can be found in the Care Plan section)  Acute Rehab PT Goals Patient Stated Goal: go back to [facility] and start walking PT Goal Formulation: With patient Time For Goal Achievement: 07/05/19 Potential to Achieve Goals: Good    Frequency 7X/week    AM-PAC PT "6 Clicks" Mobility  Outcome Measure Help needed turning from your back to your side while in a flat bed without using bedrails?: None Help needed moving from lying on your back to sitting on the side of a flat bed without using bedrails?: A  Little Help needed moving to and from a bed to a chair (including a wheelchair)?: A Little Help needed standing up from a chair using your arms (e.g., wheelchair or bedside chair)?: A Little Help needed to walk in hospital room?: A Little Help needed climbing 3-5 steps with a railing? : A Little 6 Click Score: 19    End of Session Equipment Utilized During Treatment: Gait belt Activity Tolerance: Patient tolerated treatment well Patient left: in chair;with call bell/phone within reach;with chair alarm set;with nursing/sitter in room Nurse Communication: Mobility status PT Visit Diagnosis: Muscle weakness (generalized) (M62.81);Difficulty in walking, not elsewhere classified (R26.2)    Time: 4193-7902 PT Time Calculation (min) (ACUTE ONLY): 28 min   Charges:   PT Evaluation $PT Eval Low Complexity: 1 Low PT Treatments $Therapeutic Exercise: 8-22 mins        Verner Mould, DPT Physical Therapist with [facility] [phone]  06/28/2019 2:10 PM

## 2019-06-29 ENCOUNTER — Encounter: Payer: Self-pay | Admitting: *Deleted

## 2019-06-29 DIAGNOSIS — M810 Age-related osteoporosis without current pathological fracture: Secondary | ICD-10-CM | POA: Diagnosis not present

## 2019-06-29 DIAGNOSIS — R2689 Other abnormalities of gait and mobility: Secondary | ICD-10-CM | POA: Diagnosis not present

## 2019-06-29 DIAGNOSIS — M62561 Muscle wasting and atrophy, not elsewhere classified, right lower leg: Secondary | ICD-10-CM | POA: Diagnosis not present

## 2019-06-29 DIAGNOSIS — Z471 Aftercare following joint replacement surgery: Secondary | ICD-10-CM | POA: Diagnosis not present

## 2019-06-29 DIAGNOSIS — N3281 Overactive bladder: Secondary | ICD-10-CM | POA: Diagnosis not present

## 2019-06-29 DIAGNOSIS — M1711 Unilateral primary osteoarthritis, right knee: Secondary | ICD-10-CM | POA: Diagnosis not present

## 2019-06-29 DIAGNOSIS — M25561 Pain in right knee: Secondary | ICD-10-CM | POA: Diagnosis not present

## 2019-06-29 DIAGNOSIS — I10 Essential (primary) hypertension: Secondary | ICD-10-CM | POA: Diagnosis not present

## 2019-06-29 DIAGNOSIS — E785 Hyperlipidemia, unspecified: Secondary | ICD-10-CM | POA: Diagnosis not present

## 2019-06-29 DIAGNOSIS — R278 Other lack of coordination: Secondary | ICD-10-CM | POA: Diagnosis not present

## 2019-06-29 DIAGNOSIS — M069 Rheumatoid arthritis, unspecified: Secondary | ICD-10-CM | POA: Diagnosis not present

## 2019-06-29 LAB — CBC
HCT: 28.1 % — ABNORMAL LOW (ref 36.0–46.0)
Hemoglobin: 9.5 g/dL — ABNORMAL LOW (ref 12.0–15.0)
MCH: 33.5 pg (ref 26.0–34.0)
MCHC: 33.8 g/dL (ref 30.0–36.0)
MCV: 98.9 fL (ref 80.0–100.0)
Platelets: 150 10*3/uL (ref 150–400)
RBC: 2.84 MIL/uL — ABNORMAL LOW (ref 3.87–5.11)
RDW: 14.7 % (ref 11.5–15.5)
WBC: 12.2 10*3/uL — ABNORMAL HIGH (ref 4.0–10.5)
nRBC: 0 % (ref 0.0–0.2)

## 2019-06-29 LAB — BASIC METABOLIC PANEL
Anion gap: 7 (ref 5–15)
BUN: 19 mg/dL (ref 8–23)
CO2: 23 mmol/L (ref 22–32)
Calcium: 8.9 mg/dL (ref 8.9–10.3)
Chloride: 113 mmol/L — ABNORMAL HIGH (ref 98–111)
Creatinine, Ser: 0.8 mg/dL (ref 0.44–1.00)
GFR calc Af Amer: >60 mL/min (ref >60)
GFR calc non Af Amer: >60 mL/min (ref >60)
Glucose, Bld: 157 mg/dL — ABNORMAL HIGH (ref 70–99)
Potassium: 4.7 mmol/L (ref 3.5–5.1)
Sodium: 143 mmol/L (ref 135–145)

## 2019-06-29 MED ORDER — ASPIRIN 325 MG PO TBEC: DELAYED_RELEASE_TABLET | ORAL | 0 refills | Status: DC

## 2019-06-29 MED ORDER — DOCUSATE SODIUM 100 MG PO CAPS: ORAL_CAPSULE | ORAL | 0 refills | Status: DC

## 2019-06-29 MED ORDER — GABAPENTIN 300 MG PO CAPS: ORAL_CAPSULE | ORAL | 0 refills | Status: DC

## 2019-06-29 MED ORDER — OXYCODONE HCL 5 MG PO TABS: ORAL_TABLET | ORAL | 0 refills | Status: DC

## 2019-06-29 MED ORDER — POLYETHYLENE GLYCOL 3350 17 G PO PACK: PACK | ORAL | 0 refills | Status: DC

## 2019-06-29 NOTE — TOC Transition Note (Addendum)
Transition of Care [facility]) - CM/SW Discharge Note   Patient Details  Name: Shannon Mcintyre MRN: [id number] Date of Birth: 08-Jul-1932  Transition of Care [facility]) CM/SW Contact:  Clearance Coots, LCSW Phone Number: 06/29/2019, 10:27 AM   Clinical Narrative:    Patient will discharge to [facility] for rehab.  FL2 completed per facility request CSW confirm the facility is ready to accept the patient.   Nurse call report to:[phone], room 115.  Spouse to transport.     Final next level of care: Skilled Nursing Facility Barriers to Discharge: Barriers Resolved   Patient Goals and CMS Choice     Choice offered to / list presented to : NA  Discharge Placement PASRR number recieved: (N/A)            Patient chooses bed at: [facility] Patient to be transferred to facility by: Spouse   Patient and family notified of of transfer: 06/29/19  Discharge Plan and Services In-house Referral: Clinical Social Work              DME Arranged: CPM, Environmental consultant rolling DME Agency: [employer]                  Social Determinants of Health (SDOH) Interventions     Readmission Risk Interventions No flowsheet data found.

## 2019-06-29 NOTE — [place] FL2 (Addendum)
[place] MEDICAID FL2 LEVEL OF CARE SCREENING TOOL     IDENTIFICATION  Patient Name: Shannon Mcintyre Birthdate: 1932/06/21 Sex: female Admission Date (Current Location): 06/28/2019  [facility] and [place] Number:  Producer, television/film/video and Address:  [facility],  [address] [place], [place] [address]      Provider Number: [id number]  Attending Physician Name and Address:  Salvatore Marvel, MD  Relative Name and Phone Number:     [place], [address] Myrtie Hawk [phone]  [phone]       Current Level of Care: Hospital Recommended Level of Care: Skilled Nursing Facility Prior Approval Number:    Date Approved/Denied:   PASRR Number:    Discharge Plan: SNF    Current Diagnoses: Patient Active Problem List   Diagnosis Date Noted  . S/P total knee replacement, left   . Closed fracture of left olecranon process 07/28/2018  . Primary localized osteoarthritis of right knee   . Vocal cord atrophy 11/22/2013  . Hyperlipidemia 11/12/2011  . Dysphonia 02/18/2011  . Eczema 11/05/2010  . Essential hypertension 08/01/2009  . ARTHRITIS 03/29/2008  . DIVERTICULOSIS, COLON 09/17/2006    Orientation RESPIRATION BLADDER Height & Weight     Self, Time, Situation, Place  Normal Continent Weight: 142 lb (64.4 kg) Height:  5\' 4"  (162.6 cm)  BEHAVIORAL SYMPTOMS/MOOD NEUROLOGICAL BOWEL NUTRITION STATUS      Continent Diet(Regular Diet)  AMBULATORY STATUS COMMUNICATION OF NEEDS Skin   Extensive Assist Verbally Surgical wounds(Right Knee)                       Personal Care Assistance Level of Assistance  Bathing, Dressing, Feeding Bathing Assistance: Limited assistance Feeding assistance: Independent Dressing Assistance: Limited assistance     Functional Limitations Info  Sight, Hearing, Speech Sight Info: Adequate Hearing Info: Adequate Speech Info: Adequate    SPECIAL CARE FACTORS FREQUENCY  PT (By licensed PT), OT (By licensed OT)     PT Frequency:  5x/week OT Frequency: 5x/week            Contractures Contractures Info: Not present    Additional Factors Info  Code Status, Allergies Code Status Info: Fullcode Allergies Info: Allergies: No Known Allergies           Current Medications (06/29/2019):  This is the current hospital active medication list Current Facility-Administered Medications  Medication Dose Route Frequency Provider Last Rate Last Admin  . 0.9 % NaCl with KCl 20 mEq/ L  infusion   Intravenous Continuous Shepperson, Kirstin, PA-C   Stopped at 06/29/19 0835  . alum & mag hydroxide-simeth (MAALOX/MYLANTA) 200-200-20 MG/5ML suspension 30 mL  30 mL Oral Q4H PRN Shepperson, Kirstin, PA-C      . aspirin EC tablet 325 mg  325 mg Oral Q breakfast Shepperson, Kirstin, PA-C   325 mg at 06/29/19 0836  . darifenacin (ENABLEX) 24 hr tablet 7.5 mg  7.5 mg Oral Daily Shepperson, Kirstin, PA-C   7.5 mg at 06/29/19 0836  . dexamethasone (DECADRON) injection 10 mg  10 mg Intravenous Q8H Shepperson, Kirstin, PA-C   10 mg at 06/29/19 07/01/19  . diphenhydrAMINE (BENADRYL) 12.5 MG/5ML elixir 12.5-25 mg  12.5-25 mg Oral Q4H PRN Shepperson, Kirstin, PA-C      . docusate sodium (COLACE) capsule 100 mg  100 mg Oral BID Shepperson, Kirstin, PA-C   100 mg at 06/29/19 0836  . gabapentin (NEURONTIN) capsule 300 mg  300 mg Oral QHS Shepperson, Kirstin, PA-C   300 mg  at 06/28/19 2135  . menthol-cetylpyridinium (CEPACOL) lozenge 3 mg  1 lozenge Oral PRN Shepperson, Kirstin, PA-C       Or  . phenol (CHLORASEPTIC) mouth spray 1 spray  1 spray Mouth/Throat PRN Shepperson, Kirstin, PA-C      . metoCLOPramide (REGLAN) tablet 5-10 mg  5-10 mg Oral Q8H PRN Shepperson, Kirstin, PA-C       Or  . metoCLOPramide (REGLAN) injection 5-10 mg  5-10 mg Intravenous Q8H PRN Shepperson, Kirstin, PA-C      . ondansetron (ZOFRAN) tablet 4 mg  4 mg Oral Q6H PRN Shepperson, Kirstin, PA-C       Or  . ondansetron (ZOFRAN) injection 4 mg  4 mg Intravenous Q6H PRN  Shepperson, Kirstin, PA-C      . oxyCODONE (Oxy IR/ROXICODONE) immediate release tablet 5-10 mg  5-10 mg Oral Q4H PRN Shepperson, Kirstin, PA-C   5 mg at 06/28/19 1455  . polyethylene glycol (MIRALAX / GLYCOLAX) packet 17 g  17 g Oral BID Shepperson, Kirstin, PA-C   17 g at 06/29/19 5638     Discharge Medications: Please see discharge summary for a list of discharge medications.  Relevant Imaging Results:  Relevant Lab Results:   Additional Information ssn: [id number]  Lia Hopping, LCSW

## 2019-06-29 NOTE — Progress Notes (Signed)
Physical Therapy Treatment Patient Details Name: Shannon Mcintyre MRN: [id number] DOB: [birth year] Today's Date: 06/29/2019    History of Present Illness Patient is 86 y.o. female s/p Rt TKA on 06/28/19 with PMH significant for lt TKA, HTN, OA, Lt elbow fracture s/p ORIF on 07/28/18.    PT Comments    Pt assisted with performed LE exercises and ambulated in hallway.  Pt anticipates d/c back to [facility] today.   Follow Up Recommendations  Follow surgeon's recommendation for DC plan and follow-up therapies;Home health PT     Equipment Recommendations  Rolling walker with 5" wheels    Recommendations for Other Services       Precautions / Restrictions Precautions Precautions: Fall;Knee Restrictions Weight Bearing Restrictions: No    Mobility  Bed Mobility               General bed mobility comments: pt in recliner on arrival  Transfers Overall transfer level: Needs assistance Equipment used: Rolling walker (2 wheeled) Transfers: Sit to/from Stand Sit to Stand: Min guard         General transfer comment: verbal cues for UE and LE positioning, min/guard for safety  Ambulation/Gait Ambulation/Gait assistance: Min guard Gait Distance (Feet): 80 Feet Assistive device: Rolling walker (2 wheeled) Gait Pattern/deviations: Step-through pattern;Decreased stride length;Antalgic;Decreased stance time - right     General Gait Details: verbal cues for heel strike, RW positioning   Stairs             Wheelchair Mobility    Modified Rankin (Stroke Patients Only)       Balance                                            Cognition Arousal/Alertness: Awake/alert Behavior During Therapy: WFL for tasks assessed/performed Overall Cognitive Status: Within Functional Limits for tasks assessed                                        Exercises Total Joint Exercises Ankle Circles/Pumps: AROM;Both;10 reps Quad Sets:  AROM;Both;10 reps Heel Slides: AAROM;Right;10 reps Hip ABduction/ADduction: AROM;Right;10 reps Straight Leg Raises: AROM;Right;10 reps Goniometric ROM: R knee AAROM approx 85* flexion    General Comments        Pertinent Vitals/Pain Pain Assessment: 0-10 Pain Score: 4  Pain Location: Rt knee Pain Descriptors / Indicators: Aching;Discomfort;Sore Pain Intervention(s): Repositioned;Monitored during session    Home Living                      Prior Function            PT Goals (current goals can now be found in the care plan section) Progress towards PT goals: Progressing toward goals    Frequency    7X/week      PT Plan Current plan remains appropriate    Co-evaluation              AM-PAC PT "6 Clicks" Mobility   Outcome Measure  Help needed turning from your back to your side while in a flat bed without using bedrails?: None Help needed moving from lying on your back to sitting on the side of a flat bed without using bedrails?: A Little Help needed moving to and from a bed to a chair (including  a wheelchair)?: A Little Help needed standing up from a chair using your arms (e.g., wheelchair or bedside chair)?: A Little Help needed to walk in hospital room?: A Little Help needed climbing 3-5 steps with a railing? : A Little 6 Click Score: 19    End of Session Equipment Utilized During Treatment: Gait belt Activity Tolerance: Patient tolerated treatment well Patient left: in chair;with call bell/phone within reach   PT Visit Diagnosis: Muscle weakness (generalized) (M62.81);Difficulty in walking, not elsewhere classified (R26.2)     Time: 9798-9211 PT Time Calculation (min) (ACUTE ONLY): 18 min  Charges:  $Therapeutic Exercise: 8-22 mins                     Arlyce Dice, DPT Acute Rehabilitation Services Office: [phone]   Trena Platt 06/29/2019, 2:49 PM

## 2019-06-29 NOTE — Discharge Summary (Signed)
Patient ID: Shannon Mcintyre MRN: [id number] DOB/AGE: 1932-05-04 86 y.o.  Admit date: 06/28/2019 Discharge date: 06/29/2019  Admission Diagnoses:  Active Problems:   Primary localized osteoarthritis of right knee   Discharge Diagnoses:  Same  Past Medical History:  Diagnosis Date  . Arthritis   . Closed fracture of left olecranon process 07/28/2018  . Diverticulosis of colon   . Eczema   . Hypertension   . Left elbow fracture   . S/P total knee replacement, left     Surgeries: Procedure(s): TOTAL KNEE ARTHROPLASTY on 06/28/2019   Consultants:   Discharged Condition: Improved  Hospital Course: Shannon Mcintyre is an 86 y.o. female who was admitted 06/28/2019 for operative treatment of<principal problem not specified>. Patient has severe unremitting pain that affects sleep, daily activities, and work/hobbies. After pre-op clearance the patient was taken to the operating room on 06/28/2019 and underwent  Procedure(s): TOTAL KNEE ARTHROPLASTY.    Patient was given perioperative antibiotics:  Anti-infectives (From admission, onward)   Start     Dose/Rate Route Frequency Ordered Stop   06/28/19 1400  ceFAZolin (ANCEF) IVPB 2g/100 mL premix     2 g 200 mL/hr over 30 Minutes Intravenous Every 6 hours 06/28/19 1029 06/29/19 0710   06/28/19 0645  ceFAZolin (ANCEF) IVPB 2g/100 mL premix     2 g 200 mL/hr over 30 Minutes Intravenous On call to O.R. 06/28/19 8309 06/28/19 0756   06/28/19 0635  ceFAZolin (ANCEF) 2-4 GM/100ML-% IVPB    Note to Pharmacy: Montel Clock   : cabinet override      06/28/19 0635 06/28/19 0753       Patient was given sequential compression devices, early ambulation, and chemoprophylaxis to prevent DVT.  Patient benefited maximally from hospital stay and there were no complications.    Recent vital signs:  Patient Vitals for the past 24 hrs:  BP Temp Temp src Pulse Resp SpO2  06/29/19 0540 (!) 143/65 98.3 F (36.8 C) Oral 72 18 96 %  06/29/19  0217 (!) 134/57 98.3 F (36.8 C) Oral 75 18 96 %  06/28/19 2217 138/61 98.5 F (36.9 C) Oral 78 18 97 %  06/28/19 1823 (!) 131/49 98 F (36.7 C) Oral 89 16 96 %  06/28/19 1336 (!) 135/52 97.6 F (36.4 C) Oral 84 16 98 %  06/28/19 1238 (!) 148/61 97.6 F (36.4 C) -- 78 16 100 %  06/28/19 1134 (!) 124/59 98.6 F (37 C) Axillary 72 16 100 %  06/28/19 1028 (!) 145/60 97.8 F (36.6 C) -- 67 18 100 %  06/28/19 1000 126/69 (!) 97.4 F (36.3 C) -- 72 17 100 %  06/28/19 0945 (!) 114/48 -- -- 71 12 100 %  06/28/19 0930 139/66 -- -- 71 13 100 %  06/28/19 0922 108/89 (!) 97.5 F (36.4 C) -- 71 13 100 %     Recent laboratory studies:  Recent Labs    06/29/19 0403  WBC 12.2*  HGB 9.5*  HCT 28.1*  PLT 150  NA 143  K 4.7  CL 113*  CO2 23  BUN 19  CREATININE 0.80  GLUCOSE 157*  CALCIUM 8.9     Discharge Medications:   Allergies as of 06/29/2019   No Known Allergies     Medication List    STOP taking these medications   alendronate 70 MG tablet Commonly known as: FOSAMAX   losartan 25 MG tablet Commonly known as: COZAAR   methotrexate 2.5 MG tablet Commonly known as:  RHEUMATREX     TAKE these medications   aspirin 325 MG EC tablet 1 tab a day for the next 30 days to prevent blood clots   Biotin 1000 MCG tablet Take 1,000 mcg by mouth daily.   docusate sodium 100 MG capsule Commonly known as: COLACE 1 tab 2 times a day while on narcotics.  STOOL SOFTENER   Folic Acid 5 MG Caps Take 5 mg by mouth daily.   gabapentin 300 MG capsule Commonly known as: NEURONTIN 1 po qhs for nerve pain   oxyCODONE 5 MG immediate release tablet Commonly known as: Oxy IR/ROXICODONE 1 po q 4 hrs prn pain   polyethylene glycol 17 g packet Commonly known as: MIRALAX / GLYCOLAX 17grams in 6 oz of something to drink twice a day until bowel movement.  LAXITIVE.  Restart if two days since last bowel movement   solifenacin 5 MG tablet Commonly known as: VESICARE Take 5 mg by mouth  daily.   Vitamin D3 50 MCG (2000 UT) Tabs Take 2,000 Units by mouth daily.            Discharge Care Instructions  (From admission, onward)         Start     Ordered   06/29/19 0000  Change dressing    Comments: DO NOT REMOVE BANDAGE OVER SURGICAL INCISION.  WASH WHOLE LEG INCLUDING OVER THE WATERPROOF BANDAGE WITH SOAP AND WATER EVERY DAY.   06/29/19 7903          Diagnostic Studies: No results found.  Disposition: Discharge disposition: 01-Home or Self Care       Discharge Instructions    CPM   Complete by: As directed    Continuous passive motion machine (CPM):      Use the CPM from 0 to 90 for 6 hours per day.       You may break it up into 2 or 3 sessions per day.      Use CPM for 2 weeks or until you are told to stop.   Call MD / Call 911   Complete by: As directed    If you experience chest pain or shortness of breath, CALL 911 and be transported to the hospital emergency room.  If you develope a fever above 101 F, pus (white drainage) or increased drainage or redness at the wound, or calf pain, call your surgeon's office.   Change dressing   Complete by: As directed    DO NOT REMOVE BANDAGE OVER SURGICAL INCISION.  WASH WHOLE LEG INCLUDING OVER THE WATERPROOF BANDAGE WITH SOAP AND WATER EVERY DAY.   Constipation Prevention   Complete by: As directed    Drink plenty of fluids.  Prune juice may be helpful.  You may use a stool softener, such as Colace (over the counter) 100 mg twice a day.  Use MiraLax (over the counter) for constipation as needed.   Diet - low sodium heart healthy   Complete by: As directed    Discharge instructions   Complete by: As directed    INSTRUCTIONS AFTER JOINT REPLACEMENT   GET UP AND WALK EVERY HOUR DURING THE DAY.   USE BLUE BLOCK (ZERO DEGREE BONE FOAM) WHEN YOU ARE SITTING OR LYING DOWN. ATLEAST 3-4 TIMES A DAY DO NOT TAKE BLOOD PRESSURE MEDICINE UNTIL PRESSURE IS GREATER THAN 140/90.  LOW PRESSURE AFTER SURGERY IS NORMAL.   BLOOD PRESSURE MEDICINE CAN MAKE IT ABNORMALLY LOW ESPECIALLY THE FIRST WEEK OR TWO AFTER SURGERY.  Remove items at home which could result in a fall. This includes throw rugs or furniture in walking pathways You may notice swelling that will progress down to the foot and ankle.  This is normal after surgery.  Elevate your leg when you are not up walking on it.   Continue to use the breathing machine you got in the hospital (incentive spirometer) which will help keep your temperature down.  It is common for your temperature to cycle up and down following surgery, especially at night when you are not up moving around and exerting yourself.  The breathing machine keeps your lungs expanded and your temperature down.   DIET:  As you were doing prior to hospitalization, we recommend a well-balanced diet.  DRESSING / WOUND CARE / SHOWERING  Keep the surgical dressing until follow up.  The dressing is water proof, so you can shower without any extra covering.  IF THE DRESSING FALLS OFF or the wound gets wet inside, CALL THE OFFICE.    Do not use any lotions or creams on the incision until instructed by your surgeon.    ACTIVITY  Increase activity slowly as tolerated, but follow the weight bearing instructions below.   No driving for 4 weeks or until further direction given by your physician.  You cannot drive while taking narcotics.  No lifting or carrying greater than 10 lbs. until further directed by your surgeon. Avoid periods of inactivity such as sitting longer than an hour when not asleep. This helps prevent blood clots.  You may return to work once you are authorized by your doctor.     WEIGHT BEARING   Weight bearing as tolerated with assist device (walker, cane, etc) as directed, use it as long as suggested by your surgeon or therapist, typically at least 2-3 weeks.   EXERCISES  Results after joint replacement surgery are often greatly improved when you follow the exercise, range  of motion and muscle strengthening exercises prescribed by your doctor. Safety measures are also important to protect the joint from further injury. Any time any of these exercises cause you to have increased pain or swelling, decrease what you are doing until you are comfortable again and then slowly increase them. If you have problems or questions, call your caregiver or physical therapist for advice.   Rehabilitation is important following a joint replacement. After just a few days of immobilization, the muscles of the leg can become weakened and shrink (atrophy).  These exercises are designed to build up the tone and strength of the thigh and leg muscles and to improve motion. Often times heat used for twenty to thirty minutes before working out will loosen up your tissues and help with improving the range of motion but do not use heat for the first two weeks following surgery (sometimes heat can increase post-operative swelling).   These exercises can be done on a training (exercise) mat, on the floor, on a table or on a bed. Use whatever works the best and is most comfortable for you.    Use music or television while you are exercising so that the exercises are a pleasant break in your day. This will make your life better with the exercises acting as a break in your routine that you can look forward to.   Perform all exercises about fifteen times, three times per day or as directed.  You should exercise both the operative leg and the other leg as well.   Exercises include:  [employer]  Sets - Tighten up the muscle on the front of the thigh (Quad) and hold for 5-10 seconds.   Straight Leg Raises - With your knee straight (if you were given a brace, keep it on), lift the leg to 60 degrees, hold for 3 seconds, and slowly lower the leg.  Perform this exercise against resistance later as your leg gets stronger.  Leg Slides: Lying on your back, slowly slide your foot toward your buttocks, bending your knee up off  the floor (only go as far as is comfortable). Then slowly slide your foot back down until your leg is flat on the floor again.  Angel Wings: Lying on your back spread your legs to the side as far apart as you can without causing discomfort.  Hamstring Strength:  Lying on your back, push your heel against the floor with your leg straight by tightening up the muscles of your buttocks.  Repeat, but this time bend your knee to a comfortable angle, and push your heel against the floor.  You may put a pillow under the heel to make it more comfortable if necessary.   A rehabilitation program following joint replacement surgery can speed recovery and prevent re-injury in the future due to weakened muscles. Contact your doctor or a physical therapist for more information on knee rehabilitation.    CONSTIPATION  Constipation is defined medically as fewer than three stools per week and severe constipation as less than one stool per week.  Even if you have a regular bowel pattern at home, your normal regimen is likely to be disrupted due to multiple reasons following surgery.  Combination of anesthesia, postoperative narcotics, change in appetite and fluid intake all can affect your bowels.   YOU MUST use at least one of the following options; they are listed in order of increasing strength to get the job done.  They are all available over the counter, and you may need to use some, POSSIBLY even all of these options:    Drink plenty of fluids (prune juice may be helpful) and high fiber foods Colace 100 mg by mouth twice a day  Senokot for constipation as directed and as needed Dulcolax (bisacodyl), take with full glass of water  Miralax (polyethylene glycol) once or twice a day as needed.  If you have tried all these things and are unable to have a bowel movement in the first 3-4 days after surgery call either your surgeon or your primary doctor.    If you experience loose stools or diarrhea, hold the  medications until you stool forms back up.  If your symptoms do not get better within 1 week or if they get worse, check with your doctor.  If you experience "the worst abdominal pain ever" or develop nausea or vomiting, please contact the office immediately for further recommendations for treatment.   ITCHING:  If you experience itching with your medications, try taking only a single pain pill, or even half a pain pill at a time.  You can also use Benadryl over the counter for itching or also to help with sleep.   TED HOSE STOCKINGS:  Use stockings on both legs until for at least 4 weeks or as directed by physician office. They may be removed at night for sleeping.  MEDICATIONS:  See your medication summary on the "After Visit Summary" that nursing will review with you.  You may have some home medications which will be placed on hold until you complete the course of  blood thinner medication.  It is important for you to complete the blood thinner medication as prescribed.  PRECAUTIONS:  If you experience chest pain or shortness of breath - call 911 immediately for transfer to the hospital emergency department.   If you develop a fever greater that 101 F, purulent drainage from wound, increased redness or drainage from wound, foul odor from the wound/dressing, or calf pain - CONTACT YOUR SURGEON.                                                   FOLLOW-UP APPOINTMENTS:  If you do not already have a post-op appointment, please call the office for an appointment to be seen by your surgeon.  Guidelines for how soon to be seen are listed in your "After Visit Summary", but are typically between 1-4 weeks after surgery.  OTHER INSTRUCTIONS:   Knee Replacement:  Do not place pillow under knee, focus on keeping the knee straight while resting. CPM instructions: 0-90 degrees, 2 hours in the morning, 2 hours in the afternoon, and 2 hours in the evening. Place foam block, curve side up under heel at all times  except when in CPM or when walking.  DO NOT modify, tear, cut, or change the foam block in any way.  MAKE SURE YOU:  Understand these instructions.  Get help right away if you are not doing well or get worse.    Thank you for letting us be a part of your medical care team.  It is a privilege we respect greatly.  We hope these instructions will help you stay on track for a fast and full recovery!   Do not put a pillow under the knee. Place it under the heel.   Complete by: As directed    Place gray foam block, curve side up under heel at all times except when in CPM or when walking.  DO NOT modify, tear, cut, or change in any way the gray foam block.   Increase activity slowly as tolerated   Complete by: As directed    Patient may shower   Complete by: As directed    Aquacel dressing is water proof    Wash over it and the whole leg with soap and water at the end of your shower   TED hose   Complete by: As directed    Use stockings (TED hose) for 2 weeks on both leg(s).  You may remove them at night for sleeping.      Follow-up Information    Elsie Saas, MD. Go on 07/12/2019.   Specialty: Orthopedic Surgery Why: Your appointment has been made for 2:00.  Contact information: [place] [address] [phone]            Signed: Linda Hedges 06/29/2019, 8:43 AM

## 2019-06-30 ENCOUNTER — Non-Acute Institutional Stay (SKILLED_NURSING_FACILITY): Payer: Medicare Other | Admitting: Internal Medicine

## 2019-06-30 ENCOUNTER — Encounter: Payer: Self-pay | Admitting: Internal Medicine

## 2019-06-30 DIAGNOSIS — E7800 Pure hypercholesterolemia, unspecified: Secondary | ICD-10-CM

## 2019-06-30 DIAGNOSIS — M62561 Muscle wasting and atrophy, not elsewhere classified, right lower leg: Secondary | ICD-10-CM | POA: Diagnosis not present

## 2019-06-30 DIAGNOSIS — M1711 Unilateral primary osteoarthritis, right knee: Secondary | ICD-10-CM | POA: Diagnosis not present

## 2019-06-30 DIAGNOSIS — L308 Other specified dermatitis: Secondary | ICD-10-CM | POA: Diagnosis not present

## 2019-06-30 DIAGNOSIS — J383 Other diseases of vocal cords: Secondary | ICD-10-CM

## 2019-06-30 DIAGNOSIS — R2689 Other abnormalities of gait and mobility: Secondary | ICD-10-CM | POA: Diagnosis not present

## 2019-06-30 DIAGNOSIS — Z96652 Presence of left artificial knee joint: Secondary | ICD-10-CM | POA: Diagnosis not present

## 2019-06-30 DIAGNOSIS — I10 Essential (primary) hypertension: Secondary | ICD-10-CM | POA: Diagnosis not present

## 2019-06-30 DIAGNOSIS — Z471 Aftercare following joint replacement surgery: Secondary | ICD-10-CM | POA: Diagnosis not present

## 2019-06-30 DIAGNOSIS — R490 Dysphonia: Secondary | ICD-10-CM | POA: Diagnosis not present

## 2019-06-30 DIAGNOSIS — M25561 Pain in right knee: Secondary | ICD-10-CM | POA: Diagnosis not present

## 2019-06-30 DIAGNOSIS — R278 Other lack of coordination: Secondary | ICD-10-CM | POA: Diagnosis not present

## 2019-06-30 NOTE — Progress Notes (Signed)
Patient ID: Shannon Mcintyre, female   DOB: 1933/02/28, 86 y.o.   MRN: [id number]  Provider:  Gwenith Spitz. Renato Gails, D.O., C.M.D. Location:  [facility] Room Number: 115 Place of Service:  SNF (31)  Previous PCP: Merlene Laughter, MD Patient Care Team: Merlene Laughter, MD as PCP - General (Internal Medicine)  Extended Emergency Contact Information Primary Emergency Contact: Hale [address]one [address] Address: [address]          [place], [place] [address] D[place] of [place] Home Phone: [phone] Mobile Phone: [phone] Relation: Spouse  Code Status: full code Goals of Care: Advanced Directive information Advanced Directives 06/30/2019  Does Patient Have a Medical Advance Directive? Yes  Type of Advance Directive Healthcare Power of Attorney  Does patient want to make changes to medical advance directive? No - Patient declined  Copy of Healthcare Power of Attorney in Chart? No - copy requested  Would patient like information on creating a medical advance directive? -      Chief Complaint  Patient presents with  . New Admit To SNF    To admit to establish care     HPI: Patient is a 86 y.o. female with a h/o generalized OA with the right knee no longer responding to conservative therapy, HTN, vocal cord atrophy with dysphonia seen today for admission to [facility] for rehab s/p right total knee arthroplasty by Dr. Thurston Hole on 06/28/19.  Last note from Dr. Pete Glatter requested for more information about her history.  She is here for PT, OT.  She is doing well so far.  She's using her CPM machine as directed.  She still has swelling of the right leg, but minimal pain.  She's primarily been using tylenol.  She's a bit confused which is not normal for her.  She keeps referring to [facility] as if she's somewhere else right now.  Nursing also noticed this and attributed it to postop anesthesia effect (or from the oxycodone, but not using anymore).   She is also on gabapentin for pain per ortho orders and a bowel regimen.  She is moving her bowels well.  She is on full dose aspirin DVT prophylaxis per ortho for 30 days from 3/30.    She has been on methotrexate with folic acid for her eczema.    She takes vesicare for overactive bladder.    She is now on a bowel regimen due to oxycodone for pain which patient is not taking.  She does have gabapentin, also, for pain ordered by ortho.    She is here for PT, OT with goal to return home.    She does not drink, smoke or use illicit drugs.   She is married and lives with her husband, Dr. Clenton Pare (dentist), here at [facility] in [place]    She had her other knee replaced in the past and it went well.  She feels like this one is going even better.    Past Medical History:  Diagnosis Date  . Arthritis   . Closed fracture of left olecranon process 07/28/2018  . Diverticulosis of colon   . Eczema   . Hypertension   . Left elbow fracture   . S/P total knee replacement, left    Past Surgical History:  Procedure Laterality Date  . ABDOMINAL HYSTERECTOMY  1980   BSO  . APPENDECTOMY  1980  . knee arthoscopy  2010   right  . KNEE ARTHROSCOPY  2001  left  . ORIF ELBOW FRACTURE Left 07/28/2018   Procedure: OPEN REDUCTION INTERNAL FIXATION (ORIF) ELBOW/OLECRANON FRACTURE;  Surgeon: Teryl Lucy, MD;  Location:  SURGERY CENTER;  Service: Orthopedics;  Laterality: Left;  . TOTAL KNEE ARTHROPLASTY Left 08/12/2016   Procedure: LEFT TOTAL KNEE ARTHROPLASTY;  Surgeon: Salvatore Marvel, MD;  Location: [facility] OR;  Service: Orthopedics;  Laterality: Left;  . TOTAL KNEE ARTHROPLASTY Right 06/28/2019   Procedure: TOTAL KNEE ARTHROPLASTY;  Surgeon: Salvatore Marvel, MD;  Location: [facility] ORS;  Service: Orthopedics;  Laterality: Right;    Social History   Socioeconomic History  . Marital status: Married    Spouse name: Not on file  . Number of children: Not on file  . Years of education:  Not on file  . Highest education level: Not on file  Occupational History  . Not on file  Tobacco Use  . Smoking status: Never Smoker  . Smokeless tobacco: Never Used  Substance and Sexual Activity  . Alcohol use: No  . Drug use: No  . Sexual activity: Not on file  Other Topics Concern  . Not on file  Social History Narrative   Married, lives with her husband Molly Maduro at [facility]   Social Determinants of Health   Financial Resource Strain:   . Difficulty of Paying Living Expenses:   Food Insecurity:   . Worried About Programme researcher, broadcasting/film/video in the Last Year:   . Barista in the Last Year:   Transportation Needs:   . Freight forwarder (Medical):   Marland Kitchen Lack of Transportation (Non-Medical):   Physical Activity:   . Days of Exercise per Week:   . Minutes of Exercise per Session:   Stress:   . Feeling of Stress :   Social Connections:   . Frequency of Communication with Friends and Family:   . Frequency of Social Gatherings with Friends and Family:   . Attends Religious Services:   . Active Member of Clubs or Organizations:   . Attends Banker Meetings:   Marland Kitchen Marital Status:     reports that she has never smoked. She has never used smokeless tobacco. She reports that she does not drink alcohol or use drugs.  Functional Status Survey:    Family History  Problem Relation Age of Onset  . Pneumonia Mother 48  . Heart attack Father 93       Possibly CAD starting in his 40s  . Parkinson's disease Brother     Health Maintenance  Topic Date Due  . PNA vac Low Risk Adult (2 of 2 - PCV13) 01/20/2010  . TETANUS/TDAP  09/24/2018  . INFLUENZA VACCINE  Completed  . DEXA SCAN  Completed    No Known Allergies  Outpatient Encounter Medications as of 06/30/2019  Medication Sig  . aspirin EC 325 MG EC tablet 1 tab a day for the next 30 days to prevent blood clots  . Biotin 1000 MCG tablet Take 1,000 mcg by mouth daily.   . Cholecalciferol (VITAMIN D3) 50 MCG  (2000 UT) TABS Take 2,000 Units by mouth daily.  Marland Kitchen docusate sodium (COLACE) 100 MG capsule 1 tab 2 times a day while on narcotics.  STOOL SOFTENER  . Folic Acid 5 MG CAPS Take 5 mg by mouth daily.   Marland Kitchen gabapentin (NEURONTIN) 300 MG capsule 1 po qhs for nerve pain  . methotrexate (RHEUMATREX) 2.5 MG tablet Take 2.5 mg by mouth once a week. Caution:Chemotherapy. Protect from light.  Marland Kitchen  polyethylene glycol (MIRALAX / GLYCOLAX) 17 g packet 17grams in 6 oz of something to drink twice a day until bowel movement.  LAXITIVE.  Restart if two days since last bowel movement  . solifenacin (VESICARE) 5 MG tablet Take 5 mg by mouth daily.  . [DISCONTINUED] oxyCODONE (OXY IR/ROXICODONE) 5 MG immediate release tablet 1 po q 4 hrs prn pain   No facility-administered encounter medications on file as of 06/30/2019.    Review of Systems  Constitutional: Negative for chills, fever and malaise/fatigue.  HENT: Negative for congestion, hearing loss and sore throat.   Eyes: Negative for blurred vision.  Respiratory: Negative for cough and shortness of breath.   Cardiovascular: Positive for leg swelling. Negative for chest pain, palpitations, orthopnea and PND.       Right leg swollen postop  Gastrointestinal: Negative for abdominal pain and constipation.  Genitourinary: Positive for frequency and urgency. Negative for dysuria.       Uses vesicare with benefit  Musculoskeletal: Positive for joint pain. Negative for falls.  Skin: Negative for itching and rash.       Chronic eczema of scalp on methotrexate  Neurological: Negative for dizziness and loss of consciousness.  Endo/Heme/Allergies: Bruises/bleeds easily.  Psychiatric/Behavioral: Negative for depression and memory loss. The patient is not nervous/anxious and does not have insomnia.        Reportedly no memory problems baseline--asked nurse to get last note from Dr. Pete Glatter for chart and due to this    Vitals:   06/30/19 1023  BP: 139/65  Pulse: 63    Temp: (!) 97.4 F (36.3 C)  SpO2: 96%  Weight: 148 lb 9.6 oz (67.4 kg)  Height: 5\' 4"  (1.626 m)   Body mass index is 25.51 kg/m. Physical Exam Vitals and nursing note reviewed.  Constitutional:      General: She is not in acute distress.    Appearance: Normal appearance. She is normal weight. She is not ill-appearing or toxic-appearing.  HENT:     Head: Normocephalic and atraumatic.     Right Ear: External ear normal.     Left Ear: External ear normal.     Nose: Nose normal.     Mouth/Throat:     Pharynx: Oropharynx is clear.     Comments: Hoarse, soft voice Eyes:     Extraocular Movements: Extraocular movements intact.     Conjunctiva/sclera: Conjunctivae normal.     Pupils: Pupils are equal, round, and reactive to light.  Cardiovascular:     Rate and Rhythm: Normal rate and regular rhythm.     Pulses: Normal pulses.     Heart sounds: Normal heart sounds. No murmur.  Pulmonary:     Effort: Pulmonary effort is normal.     Breath sounds: Normal breath sounds. No wheezing, rhonchi or rales.  Abdominal:     General: Bowel sounds are normal. There is no distension.     Palpations: Abdomen is soft. There is no mass.     Tenderness: There is no abdominal tenderness. There is no guarding or rebound.  Musculoskeletal:     Cervical back: Neck supple.     Right lower leg: Edema present.     Left lower leg: No edema.     Comments: Right leg notably swollen from groin to foot vs left, mildly tender when pants pulled up to see knee--hydrocolloid dressing intact, ecchymoses around knee, but no erythema, very minimal warmth vs left  Lymphadenopathy:     Cervical: No cervical adenopathy.  Skin:    General: Skin is warm and dry.     Capillary Refill: Capillary refill takes less than 2 seconds.     Coloration: Skin is pale.  Neurological:     General: No focal deficit present.     Mental Status: She is alert.     Cranial Nerves: No cranial nerve deficit.     Motor: No weakness.      Gait: Gait normal.     Comments: Oriented to day, person, not place  Psychiatric:        Mood and Affect: Mood normal.        Behavior: Behavior normal.     Labs reviewed: Basic Metabolic Panel: Recent Labs    06/18/19 1333 06/29/19 0403  NA 143 143  K 4.7 4.7  CL 110 113*  CO2 26 23  GLUCOSE 108* 157*  BUN 23 19  CREATININE 0.89 0.80  CALCIUM 9.3 8.9   Liver Function Tests: Recent Labs    06/18/19 1333  AST 30  ALT 51*  ALKPHOS 40  BILITOT 0.8  PROT 7.0  ALBUMIN 4.5   No results for input(s): LIPASE, AMYLASE in the last 8760 hours. No results for input(s): AMMONIA in the last 8760 hours. CBC: Recent Labs    06/18/19 1333 06/29/19 0403  WBC 6.6 12.2*  NEUTROABS 4.4  --   HGB 11.6* 9.5*  HCT 35.0* 28.1*  MCV 99.4 98.9  PLT 189 150   Cardiac Enzymes: No results for input(s): CKTOTAL, CKMB, CKMBINDEX, TROPONINI in the last 8760 hours. BNP: Invalid input(s): POCBNP No results found for: HGBA1C Lab Results  Component Value Date   TSH 1.16 12/22/2012   Assessment/Plan 1. Primary localized osteoarthritis of right knee -was not responsive to conservative therapy any longer so underwent left TKA  2. S/P total knee replacement, left -here for PT postop and doing well with minimal pain -keep f/u with Dr. Noemi Chapel as planned  3. Dysphonia -due to vocal cord atrophy  4. Vocal cord atrophy -noted as cause of dysphonia  5. Pure hypercholesterolemia -not on medication for this at 86  6. Other eczema -continue methotrexate and folic acid  7. Essential hypertension -bp controlled here thus far, monitor -not on meds for this at this time either  Labs/tests ordered:  No new  Ashle Stief L. Fitzroy Mikami, D.O. [facility] [address], [place] [address] Cell Phone (Mon-Fri 8am-5pm):  [phone] On Call:  [phone] & follow prompts after 5pm & weekends Office Phone:  [phone] Office Fax:   [phone]

## 2019-07-01 DIAGNOSIS — Z96651 Presence of right artificial knee joint: Secondary | ICD-10-CM | POA: Diagnosis not present

## 2019-07-01 DIAGNOSIS — N3946 Mixed incontinence: Secondary | ICD-10-CM | POA: Diagnosis not present

## 2019-07-01 DIAGNOSIS — Z471 Aftercare following joint replacement surgery: Secondary | ICD-10-CM | POA: Diagnosis not present

## 2019-07-01 DIAGNOSIS — R2689 Other abnormalities of gait and mobility: Secondary | ICD-10-CM | POA: Diagnosis not present

## 2019-07-01 DIAGNOSIS — R278 Other lack of coordination: Secondary | ICD-10-CM | POA: Diagnosis not present

## 2019-07-01 DIAGNOSIS — M25561 Pain in right knee: Secondary | ICD-10-CM | POA: Diagnosis not present

## 2019-07-01 DIAGNOSIS — Z9181 History of falling: Secondary | ICD-10-CM | POA: Diagnosis not present

## 2019-07-01 DIAGNOSIS — R41840 Attention and concentration deficit: Secondary | ICD-10-CM | POA: Diagnosis not present

## 2019-07-01 DIAGNOSIS — M63861 Disorders of muscle in diseases classified elsewhere, right lower leg: Secondary | ICD-10-CM | POA: Diagnosis not present

## 2019-07-01 DIAGNOSIS — R4189 Other symptoms and signs involving cognitive functions and awareness: Secondary | ICD-10-CM | POA: Diagnosis not present

## 2019-07-01 DIAGNOSIS — M62561 Muscle wasting and atrophy, not elsewhere classified, right lower leg: Secondary | ICD-10-CM | POA: Diagnosis not present

## 2019-07-01 DIAGNOSIS — M1711 Unilateral primary osteoarthritis, right knee: Secondary | ICD-10-CM | POA: Diagnosis not present

## 2019-07-02 DIAGNOSIS — M25561 Pain in right knee: Secondary | ICD-10-CM | POA: Diagnosis not present

## 2019-07-02 DIAGNOSIS — R278 Other lack of coordination: Secondary | ICD-10-CM | POA: Diagnosis not present

## 2019-07-02 DIAGNOSIS — M63861 Disorders of muscle in diseases classified elsewhere, right lower leg: Secondary | ICD-10-CM | POA: Diagnosis not present

## 2019-07-02 DIAGNOSIS — R2689 Other abnormalities of gait and mobility: Secondary | ICD-10-CM | POA: Diagnosis not present

## 2019-07-02 DIAGNOSIS — M62561 Muscle wasting and atrophy, not elsewhere classified, right lower leg: Secondary | ICD-10-CM | POA: Diagnosis not present

## 2019-07-02 DIAGNOSIS — R41840 Attention and concentration deficit: Secondary | ICD-10-CM | POA: Diagnosis not present

## 2019-07-05 DIAGNOSIS — M25561 Pain in right knee: Secondary | ICD-10-CM | POA: Diagnosis not present

## 2019-07-05 DIAGNOSIS — M63861 Disorders of muscle in diseases classified elsewhere, right lower leg: Secondary | ICD-10-CM | POA: Diagnosis not present

## 2019-07-05 DIAGNOSIS — M62561 Muscle wasting and atrophy, not elsewhere classified, right lower leg: Secondary | ICD-10-CM | POA: Diagnosis not present

## 2019-07-05 DIAGNOSIS — R2689 Other abnormalities of gait and mobility: Secondary | ICD-10-CM | POA: Diagnosis not present

## 2019-07-05 DIAGNOSIS — R41840 Attention and concentration deficit: Secondary | ICD-10-CM | POA: Diagnosis not present

## 2019-07-05 DIAGNOSIS — R278 Other lack of coordination: Secondary | ICD-10-CM | POA: Diagnosis not present

## 2019-07-06 DIAGNOSIS — M25561 Pain in right knee: Secondary | ICD-10-CM | POA: Diagnosis not present

## 2019-07-06 DIAGNOSIS — R2689 Other abnormalities of gait and mobility: Secondary | ICD-10-CM | POA: Diagnosis not present

## 2019-07-06 DIAGNOSIS — M62561 Muscle wasting and atrophy, not elsewhere classified, right lower leg: Secondary | ICD-10-CM | POA: Diagnosis not present

## 2019-07-06 DIAGNOSIS — M63861 Disorders of muscle in diseases classified elsewhere, right lower leg: Secondary | ICD-10-CM | POA: Diagnosis not present

## 2019-07-06 DIAGNOSIS — R41840 Attention and concentration deficit: Secondary | ICD-10-CM | POA: Diagnosis not present

## 2019-07-06 DIAGNOSIS — R278 Other lack of coordination: Secondary | ICD-10-CM | POA: Diagnosis not present

## 2019-07-07 DIAGNOSIS — M62561 Muscle wasting and atrophy, not elsewhere classified, right lower leg: Secondary | ICD-10-CM | POA: Diagnosis not present

## 2019-07-07 DIAGNOSIS — R2689 Other abnormalities of gait and mobility: Secondary | ICD-10-CM | POA: Diagnosis not present

## 2019-07-07 DIAGNOSIS — M63861 Disorders of muscle in diseases classified elsewhere, right lower leg: Secondary | ICD-10-CM | POA: Diagnosis not present

## 2019-07-07 DIAGNOSIS — R41840 Attention and concentration deficit: Secondary | ICD-10-CM | POA: Diagnosis not present

## 2019-07-07 DIAGNOSIS — M25561 Pain in right knee: Secondary | ICD-10-CM | POA: Diagnosis not present

## 2019-07-07 DIAGNOSIS — R278 Other lack of coordination: Secondary | ICD-10-CM | POA: Diagnosis not present

## 2019-07-08 DIAGNOSIS — R278 Other lack of coordination: Secondary | ICD-10-CM | POA: Diagnosis not present

## 2019-07-08 DIAGNOSIS — R2689 Other abnormalities of gait and mobility: Secondary | ICD-10-CM | POA: Diagnosis not present

## 2019-07-08 DIAGNOSIS — M62561 Muscle wasting and atrophy, not elsewhere classified, right lower leg: Secondary | ICD-10-CM | POA: Diagnosis not present

## 2019-07-08 DIAGNOSIS — M63861 Disorders of muscle in diseases classified elsewhere, right lower leg: Secondary | ICD-10-CM | POA: Diagnosis not present

## 2019-07-08 DIAGNOSIS — M25561 Pain in right knee: Secondary | ICD-10-CM | POA: Diagnosis not present

## 2019-07-08 DIAGNOSIS — R41840 Attention and concentration deficit: Secondary | ICD-10-CM | POA: Diagnosis not present

## 2019-07-09 DIAGNOSIS — I129 Hypertensive chronic kidney disease with stage 1 through stage 4 chronic kidney disease, or unspecified chronic kidney disease: Secondary | ICD-10-CM | POA: Diagnosis not present

## 2019-07-09 DIAGNOSIS — R41840 Attention and concentration deficit: Secondary | ICD-10-CM | POA: Diagnosis not present

## 2019-07-09 DIAGNOSIS — M63861 Disorders of muscle in diseases classified elsewhere, right lower leg: Secondary | ICD-10-CM | POA: Diagnosis not present

## 2019-07-09 DIAGNOSIS — M25561 Pain in right knee: Secondary | ICD-10-CM | POA: Diagnosis not present

## 2019-07-09 DIAGNOSIS — R2689 Other abnormalities of gait and mobility: Secondary | ICD-10-CM | POA: Diagnosis not present

## 2019-07-09 DIAGNOSIS — M62561 Muscle wasting and atrophy, not elsewhere classified, right lower leg: Secondary | ICD-10-CM | POA: Diagnosis not present

## 2019-07-09 DIAGNOSIS — N1831 Chronic kidney disease, stage 3a: Secondary | ICD-10-CM | POA: Diagnosis not present

## 2019-07-09 DIAGNOSIS — R278 Other lack of coordination: Secondary | ICD-10-CM | POA: Diagnosis not present

## 2019-07-09 DIAGNOSIS — M810 Age-related osteoporosis without current pathological fracture: Secondary | ICD-10-CM | POA: Diagnosis not present

## 2019-07-12 DIAGNOSIS — R41840 Attention and concentration deficit: Secondary | ICD-10-CM | POA: Diagnosis not present

## 2019-07-12 DIAGNOSIS — R2689 Other abnormalities of gait and mobility: Secondary | ICD-10-CM | POA: Diagnosis not present

## 2019-07-12 DIAGNOSIS — M63861 Disorders of muscle in diseases classified elsewhere, right lower leg: Secondary | ICD-10-CM | POA: Diagnosis not present

## 2019-07-12 DIAGNOSIS — M25561 Pain in right knee: Secondary | ICD-10-CM | POA: Diagnosis not present

## 2019-07-12 DIAGNOSIS — R278 Other lack of coordination: Secondary | ICD-10-CM | POA: Diagnosis not present

## 2019-07-12 DIAGNOSIS — M1711 Unilateral primary osteoarthritis, right knee: Secondary | ICD-10-CM | POA: Diagnosis not present

## 2019-07-12 DIAGNOSIS — M62561 Muscle wasting and atrophy, not elsewhere classified, right lower leg: Secondary | ICD-10-CM | POA: Diagnosis not present

## 2019-07-14 DIAGNOSIS — R41840 Attention and concentration deficit: Secondary | ICD-10-CM | POA: Diagnosis not present

## 2019-07-14 DIAGNOSIS — M63861 Disorders of muscle in diseases classified elsewhere, right lower leg: Secondary | ICD-10-CM | POA: Diagnosis not present

## 2019-07-14 DIAGNOSIS — M62561 Muscle wasting and atrophy, not elsewhere classified, right lower leg: Secondary | ICD-10-CM | POA: Diagnosis not present

## 2019-07-14 DIAGNOSIS — R278 Other lack of coordination: Secondary | ICD-10-CM | POA: Diagnosis not present

## 2019-07-14 DIAGNOSIS — R2689 Other abnormalities of gait and mobility: Secondary | ICD-10-CM | POA: Diagnosis not present

## 2019-07-14 DIAGNOSIS — M25561 Pain in right knee: Secondary | ICD-10-CM | POA: Diagnosis not present

## 2019-07-15 DIAGNOSIS — M6281 Muscle weakness (generalized): Secondary | ICD-10-CM | POA: Diagnosis not present

## 2019-07-15 DIAGNOSIS — M1711 Unilateral primary osteoarthritis, right knee: Secondary | ICD-10-CM | POA: Diagnosis not present

## 2019-07-15 DIAGNOSIS — R2689 Other abnormalities of gait and mobility: Secondary | ICD-10-CM | POA: Diagnosis not present

## 2019-07-15 DIAGNOSIS — R41840 Attention and concentration deficit: Secondary | ICD-10-CM | POA: Diagnosis not present

## 2019-07-15 DIAGNOSIS — M25561 Pain in right knee: Secondary | ICD-10-CM | POA: Diagnosis not present

## 2019-07-15 DIAGNOSIS — M62561 Muscle wasting and atrophy, not elsewhere classified, right lower leg: Secondary | ICD-10-CM | POA: Diagnosis not present

## 2019-07-15 DIAGNOSIS — M63861 Disorders of muscle in diseases classified elsewhere, right lower leg: Secondary | ICD-10-CM | POA: Diagnosis not present

## 2019-07-15 DIAGNOSIS — Z96651 Presence of right artificial knee joint: Secondary | ICD-10-CM | POA: Diagnosis not present

## 2019-07-15 DIAGNOSIS — R278 Other lack of coordination: Secondary | ICD-10-CM | POA: Diagnosis not present

## 2019-07-16 DIAGNOSIS — R2689 Other abnormalities of gait and mobility: Secondary | ICD-10-CM | POA: Diagnosis not present

## 2019-07-16 DIAGNOSIS — R41840 Attention and concentration deficit: Secondary | ICD-10-CM | POA: Diagnosis not present

## 2019-07-16 DIAGNOSIS — R278 Other lack of coordination: Secondary | ICD-10-CM | POA: Diagnosis not present

## 2019-07-16 DIAGNOSIS — M62561 Muscle wasting and atrophy, not elsewhere classified, right lower leg: Secondary | ICD-10-CM | POA: Diagnosis not present

## 2019-07-16 DIAGNOSIS — M25561 Pain in right knee: Secondary | ICD-10-CM | POA: Diagnosis not present

## 2019-07-16 DIAGNOSIS — M63861 Disorders of muscle in diseases classified elsewhere, right lower leg: Secondary | ICD-10-CM | POA: Diagnosis not present

## 2019-07-20 DIAGNOSIS — M6281 Muscle weakness (generalized): Secondary | ICD-10-CM | POA: Diagnosis not present

## 2019-07-20 DIAGNOSIS — M25561 Pain in right knee: Secondary | ICD-10-CM | POA: Diagnosis not present

## 2019-07-20 DIAGNOSIS — M1711 Unilateral primary osteoarthritis, right knee: Secondary | ICD-10-CM | POA: Diagnosis not present

## 2019-07-21 DIAGNOSIS — M1711 Unilateral primary osteoarthritis, right knee: Secondary | ICD-10-CM | POA: Diagnosis not present

## 2019-07-21 DIAGNOSIS — M6281 Muscle weakness (generalized): Secondary | ICD-10-CM | POA: Diagnosis not present

## 2019-07-21 DIAGNOSIS — M25561 Pain in right knee: Secondary | ICD-10-CM | POA: Diagnosis not present

## 2019-07-23 DIAGNOSIS — M6281 Muscle weakness (generalized): Secondary | ICD-10-CM | POA: Diagnosis not present

## 2019-07-23 DIAGNOSIS — M1711 Unilateral primary osteoarthritis, right knee: Secondary | ICD-10-CM | POA: Diagnosis not present

## 2019-07-23 DIAGNOSIS — M25561 Pain in right knee: Secondary | ICD-10-CM | POA: Diagnosis not present

## 2019-07-26 DIAGNOSIS — M6281 Muscle weakness (generalized): Secondary | ICD-10-CM | POA: Diagnosis not present

## 2019-07-26 DIAGNOSIS — M25561 Pain in right knee: Secondary | ICD-10-CM | POA: Diagnosis not present

## 2019-07-26 DIAGNOSIS — M1711 Unilateral primary osteoarthritis, right knee: Secondary | ICD-10-CM | POA: Diagnosis not present

## 2019-07-27 DIAGNOSIS — R278 Other lack of coordination: Secondary | ICD-10-CM | POA: Diagnosis not present

## 2019-07-27 DIAGNOSIS — M25561 Pain in right knee: Secondary | ICD-10-CM | POA: Diagnosis not present

## 2019-07-27 DIAGNOSIS — M63861 Disorders of muscle in diseases classified elsewhere, right lower leg: Secondary | ICD-10-CM | POA: Diagnosis not present

## 2019-07-27 DIAGNOSIS — M62561 Muscle wasting and atrophy, not elsewhere classified, right lower leg: Secondary | ICD-10-CM | POA: Diagnosis not present

## 2019-07-27 DIAGNOSIS — R41840 Attention and concentration deficit: Secondary | ICD-10-CM | POA: Diagnosis not present

## 2019-07-27 DIAGNOSIS — R2689 Other abnormalities of gait and mobility: Secondary | ICD-10-CM | POA: Diagnosis not present

## 2019-08-02 DIAGNOSIS — M6281 Muscle weakness (generalized): Secondary | ICD-10-CM | POA: Diagnosis not present

## 2019-08-02 DIAGNOSIS — M25561 Pain in right knee: Secondary | ICD-10-CM | POA: Diagnosis not present

## 2019-08-02 DIAGNOSIS — M1711 Unilateral primary osteoarthritis, right knee: Secondary | ICD-10-CM | POA: Diagnosis not present

## 2019-08-05 DIAGNOSIS — M1711 Unilateral primary osteoarthritis, right knee: Secondary | ICD-10-CM | POA: Diagnosis not present

## 2019-08-05 DIAGNOSIS — M25561 Pain in right knee: Secondary | ICD-10-CM | POA: Diagnosis not present

## 2019-08-05 DIAGNOSIS — M6281 Muscle weakness (generalized): Secondary | ICD-10-CM | POA: Diagnosis not present

## 2019-08-09 DIAGNOSIS — I700 Atherosclerosis of aorta: Secondary | ICD-10-CM | POA: Diagnosis not present

## 2019-08-09 DIAGNOSIS — I129 Hypertensive chronic kidney disease with stage 1 through stage 4 chronic kidney disease, or unspecified chronic kidney disease: Secondary | ICD-10-CM | POA: Diagnosis not present

## 2019-08-09 DIAGNOSIS — N1831 Chronic kidney disease, stage 3a: Secondary | ICD-10-CM | POA: Diagnosis not present

## 2019-08-16 DIAGNOSIS — M25561 Pain in right knee: Secondary | ICD-10-CM | POA: Diagnosis not present

## 2019-08-16 DIAGNOSIS — M6281 Muscle weakness (generalized): Secondary | ICD-10-CM | POA: Diagnosis not present

## 2019-08-16 DIAGNOSIS — M1711 Unilateral primary osteoarthritis, right knee: Secondary | ICD-10-CM | POA: Diagnosis not present

## 2019-08-18 DIAGNOSIS — M25561 Pain in right knee: Secondary | ICD-10-CM | POA: Diagnosis not present

## 2019-08-18 DIAGNOSIS — M1711 Unilateral primary osteoarthritis, right knee: Secondary | ICD-10-CM | POA: Diagnosis not present

## 2019-08-18 DIAGNOSIS — M6281 Muscle weakness (generalized): Secondary | ICD-10-CM | POA: Diagnosis not present

## 2019-08-18 DIAGNOSIS — Z96651 Presence of right artificial knee joint: Secondary | ICD-10-CM | POA: Diagnosis not present

## 2019-08-24 DIAGNOSIS — M6281 Muscle weakness (generalized): Secondary | ICD-10-CM | POA: Diagnosis not present

## 2019-08-24 DIAGNOSIS — M25561 Pain in right knee: Secondary | ICD-10-CM | POA: Diagnosis not present

## 2019-08-24 DIAGNOSIS — M1711 Unilateral primary osteoarthritis, right knee: Secondary | ICD-10-CM | POA: Diagnosis not present

## 2019-08-26 DIAGNOSIS — M6281 Muscle weakness (generalized): Secondary | ICD-10-CM | POA: Diagnosis not present

## 2019-08-26 DIAGNOSIS — M25561 Pain in right knee: Secondary | ICD-10-CM | POA: Diagnosis not present

## 2019-08-26 DIAGNOSIS — M1711 Unilateral primary osteoarthritis, right knee: Secondary | ICD-10-CM | POA: Diagnosis not present

## 2019-09-03 DIAGNOSIS — M25561 Pain in right knee: Secondary | ICD-10-CM | POA: Diagnosis not present

## 2019-09-03 DIAGNOSIS — M1711 Unilateral primary osteoarthritis, right knee: Secondary | ICD-10-CM | POA: Diagnosis not present

## 2019-09-03 DIAGNOSIS — M6281 Muscle weakness (generalized): Secondary | ICD-10-CM | POA: Diagnosis not present

## 2019-11-19 DIAGNOSIS — L400 Psoriasis vulgaris: Secondary | ICD-10-CM | POA: Diagnosis not present

## 2019-12-13 DIAGNOSIS — M810 Age-related osteoporosis without current pathological fracture: Secondary | ICD-10-CM | POA: Diagnosis not present

## 2019-12-13 DIAGNOSIS — I129 Hypertensive chronic kidney disease with stage 1 through stage 4 chronic kidney disease, or unspecified chronic kidney disease: Secondary | ICD-10-CM | POA: Diagnosis not present

## 2019-12-13 DIAGNOSIS — M1990 Unspecified osteoarthritis, unspecified site: Secondary | ICD-10-CM | POA: Diagnosis not present

## 2019-12-13 DIAGNOSIS — N1831 Chronic kidney disease, stage 3a: Secondary | ICD-10-CM | POA: Diagnosis not present

## 2020-01-10 DIAGNOSIS — R059 Cough, unspecified: Secondary | ICD-10-CM | POA: Diagnosis not present

## 2020-01-10 DIAGNOSIS — Z20822 Contact with and (suspected) exposure to covid-19: Secondary | ICD-10-CM | POA: Diagnosis not present

## 2020-01-17 DIAGNOSIS — N1831 Chronic kidney disease, stage 3a: Secondary | ICD-10-CM | POA: Diagnosis not present

## 2020-01-17 DIAGNOSIS — I129 Hypertensive chronic kidney disease with stage 1 through stage 4 chronic kidney disease, or unspecified chronic kidney disease: Secondary | ICD-10-CM | POA: Diagnosis not present

## 2020-01-17 DIAGNOSIS — R059 Cough, unspecified: Secondary | ICD-10-CM | POA: Diagnosis not present

## 2020-01-17 DIAGNOSIS — R413 Other amnesia: Secondary | ICD-10-CM | POA: Diagnosis not present

## 2020-02-03 DIAGNOSIS — Z23 Encounter for immunization: Secondary | ICD-10-CM | POA: Diagnosis not present

## 2020-02-08 DIAGNOSIS — L400 Psoriasis vulgaris: Secondary | ICD-10-CM | POA: Diagnosis not present

## 2020-02-10 DIAGNOSIS — I129 Hypertensive chronic kidney disease with stage 1 through stage 4 chronic kidney disease, or unspecified chronic kidney disease: Secondary | ICD-10-CM | POA: Diagnosis not present

## 2020-02-10 DIAGNOSIS — K219 Gastro-esophageal reflux disease without esophagitis: Secondary | ICD-10-CM | POA: Diagnosis not present

## 2020-02-10 DIAGNOSIS — N1831 Chronic kidney disease, stage 3a: Secondary | ICD-10-CM | POA: Diagnosis not present

## 2020-02-10 DIAGNOSIS — M810 Age-related osteoporosis without current pathological fracture: Secondary | ICD-10-CM | POA: Diagnosis not present

## 2020-02-10 DIAGNOSIS — M1990 Unspecified osteoarthritis, unspecified site: Secondary | ICD-10-CM | POA: Diagnosis not present

## 2020-02-15 DIAGNOSIS — I129 Hypertensive chronic kidney disease with stage 1 through stage 4 chronic kidney disease, or unspecified chronic kidney disease: Secondary | ICD-10-CM | POA: Diagnosis not present

## 2020-02-15 DIAGNOSIS — Z0000 Encounter for general adult medical examination without abnormal findings: Secondary | ICD-10-CM | POA: Diagnosis not present

## 2020-02-15 DIAGNOSIS — N1831 Chronic kidney disease, stage 3a: Secondary | ICD-10-CM | POA: Diagnosis not present

## 2020-02-15 DIAGNOSIS — Z1389 Encounter for screening for other disorder: Secondary | ICD-10-CM | POA: Diagnosis not present

## 2020-02-15 DIAGNOSIS — I700 Atherosclerosis of aorta: Secondary | ICD-10-CM | POA: Diagnosis not present

## 2020-03-03 ENCOUNTER — Other Ambulatory Visit: Payer: Self-pay | Admitting: Geriatric Medicine

## 2020-03-03 DIAGNOSIS — N1831 Chronic kidney disease, stage 3a: Secondary | ICD-10-CM | POA: Diagnosis not present

## 2020-03-03 DIAGNOSIS — I129 Hypertensive chronic kidney disease with stage 1 through stage 4 chronic kidney disease, or unspecified chronic kidney disease: Secondary | ICD-10-CM | POA: Diagnosis not present

## 2020-03-03 DIAGNOSIS — R413 Other amnesia: Secondary | ICD-10-CM | POA: Diagnosis not present

## 2020-03-03 DIAGNOSIS — Z79899 Other long term (current) drug therapy: Secondary | ICD-10-CM | POA: Diagnosis not present

## 2020-03-22 ENCOUNTER — Ambulatory Visit
Admission: RE | Admit: 2020-03-22 | Discharge: 2020-03-22 | Disposition: A | Discharge status: Home / Self Care | Payer: Medicare Other | Source: Ambulatory Visit | Attending: Geriatric Medicine | Admitting: Geriatric Medicine

## 2020-03-22 DIAGNOSIS — I6782 Cerebral ischemia: Secondary | ICD-10-CM | POA: Diagnosis not present

## 2020-03-22 DIAGNOSIS — G319 Degenerative disease of nervous system, unspecified: Secondary | ICD-10-CM | POA: Diagnosis not present

## 2020-03-22 DIAGNOSIS — R410 Disorientation, unspecified: Secondary | ICD-10-CM | POA: Diagnosis not present

## 2020-03-22 DIAGNOSIS — R413 Other amnesia: Secondary | ICD-10-CM

## 2020-03-22 MED ORDER — GADOBENATE DIMEGLUMINE 529 MG/ML IV SOLN
15.0000 mL | Freq: Once | INTRAVENOUS | Status: AC | PRN
  Administered 2020-03-22: 15 mL via INTRAVENOUS

## 2020-04-06 ENCOUNTER — Encounter: Payer: Self-pay | Admitting: Neurology

## 2020-05-18 DIAGNOSIS — Z79899 Other long term (current) drug therapy: Secondary | ICD-10-CM | POA: Diagnosis not present

## 2020-05-18 DIAGNOSIS — L400 Psoriasis vulgaris: Secondary | ICD-10-CM | POA: Diagnosis not present

## 2020-05-22 DIAGNOSIS — I129 Hypertensive chronic kidney disease with stage 1 through stage 4 chronic kidney disease, or unspecified chronic kidney disease: Secondary | ICD-10-CM | POA: Diagnosis not present

## 2020-05-22 DIAGNOSIS — N1831 Chronic kidney disease, stage 3a: Secondary | ICD-10-CM | POA: Diagnosis not present

## 2020-05-25 DIAGNOSIS — I129 Hypertensive chronic kidney disease with stage 1 through stage 4 chronic kidney disease, or unspecified chronic kidney disease: Secondary | ICD-10-CM | POA: Diagnosis not present

## 2020-05-25 DIAGNOSIS — M1990 Unspecified osteoarthritis, unspecified site: Secondary | ICD-10-CM | POA: Diagnosis not present

## 2020-05-25 DIAGNOSIS — K219 Gastro-esophageal reflux disease without esophagitis: Secondary | ICD-10-CM | POA: Diagnosis not present

## 2020-05-25 DIAGNOSIS — M810 Age-related osteoporosis without current pathological fracture: Secondary | ICD-10-CM | POA: Diagnosis not present

## 2020-05-25 DIAGNOSIS — N1831 Chronic kidney disease, stage 3a: Secondary | ICD-10-CM | POA: Diagnosis not present

## 2020-06-12 DIAGNOSIS — I129 Hypertensive chronic kidney disease with stage 1 through stage 4 chronic kidney disease, or unspecified chronic kidney disease: Secondary | ICD-10-CM | POA: Diagnosis not present

## 2020-06-12 DIAGNOSIS — M810 Age-related osteoporosis without current pathological fracture: Secondary | ICD-10-CM | POA: Diagnosis not present

## 2020-06-12 DIAGNOSIS — S93491A Sprain of other ligament of right ankle, initial encounter: Secondary | ICD-10-CM | POA: Diagnosis not present

## 2020-06-12 DIAGNOSIS — M1990 Unspecified osteoarthritis, unspecified site: Secondary | ICD-10-CM | POA: Diagnosis not present

## 2020-06-12 DIAGNOSIS — N1831 Chronic kidney disease, stage 3a: Secondary | ICD-10-CM | POA: Diagnosis not present

## 2020-06-12 DIAGNOSIS — K219 Gastro-esophageal reflux disease without esophagitis: Secondary | ICD-10-CM | POA: Diagnosis not present

## 2020-06-21 DIAGNOSIS — L400 Psoriasis vulgaris: Secondary | ICD-10-CM | POA: Diagnosis not present

## 2020-06-21 DIAGNOSIS — Z79899 Other long term (current) drug therapy: Secondary | ICD-10-CM | POA: Diagnosis not present

## 2020-06-23 ENCOUNTER — Other Ambulatory Visit: Payer: Self-pay

## 2020-06-23 ENCOUNTER — Ambulatory Visit (INDEPENDENT_AMBULATORY_CARE_PROVIDER_SITE_OTHER): Payer: Medicare Other | Admitting: Neurology

## 2020-06-23 ENCOUNTER — Encounter: Payer: Self-pay | Admitting: Neurology

## 2020-06-23 VITALS — BP 172/71 | HR 81 | Ht 67.0 in | Wt 146.0 lb

## 2020-06-23 DIAGNOSIS — F03A0 Unspecified dementia, mild, without behavioral disturbance, psychotic disturbance, mood disturbance, and anxiety: Secondary | ICD-10-CM

## 2020-06-23 DIAGNOSIS — F0390 Unspecified dementia without behavioral disturbance: Secondary | ICD-10-CM | POA: Diagnosis not present

## 2020-06-23 MED ORDER — DONEPEZIL HCL 10 MG PO TABS: ORAL_TABLET | ORAL | 11 refills | Status: DC

## 2020-06-23 NOTE — Progress Notes (Signed)
NEUROLOGY CONSULTATION NOTE  BEXLIE MERRICK MRN: [id number] DOB: [birth year]  Referring provider: Dr. Merlene Laughter Primary care provider: Dr. Merlene Laughter  Reason for consult:  Memory loss, abnormal brain MRI  Dear Dr Pete Glatter:  Thank you for your kind referral of Shannon Mcintyre for consultation of the above symptoms. Although her history is well known to you, please allow me to reiterate it for the purpose of our medical record. The patient was accompanied to the clinic by her husband and her son [name] on speakerphone who also provides collateral information. Records and images were personally reviewed where available.   HISTORY OF PRESENT ILLNESS: This is an 87 year old right-handed woman with a history of hypertension, hyperlipidemia, vocal cord atrophy, presenting for evaluation of memory loss and slight ventriculomegaly on brain MRI. Her husband is present and her son [name] from [place] is on speakerphone to provide additional information. She feels her memory is "pretty good as far as I can tell." Her husband started noticing memory changes around a year ago, however [name] started noticing minor changes 5 years ago where she would pick up the wrong purse or ask the same question. She was misplacing things and asking where to put things back. They would find objects where they don't belong. Cooking was becoming an issue, she would leave the stove on. She stated she manages her own medications, [name] reports medications are put in her pillbox and they give it to her AM and PM. Her husband manages finances. She stopped driving after her knee surgery, "they won't let me drive," denies getting lost. Family agrees her sense of direction is perfect but reaction times are slower. They moved to [facility] a year ago. A caregiver comes three times a week to help with showering. She is independent with dressing. There is no family history of dementia. No history of significant  head injuries or alcohol use.  She denies any headaches, dizziness, diplopia, dysarthria, dysphagia, neck/back pain, focal numbness/tingling/weakness, anosmia, tremors. She has had problems with incontinence, there has been some improvement but "not fullproof," she still wears Depends and wakes up wet in the morning. They deny any falls, she states her walking could be better, she can't walk straight and sometimes has trouble walking. She did not want to do physical therapy after her 2 knee replacements and "walks like a cowboy." Sleep is good, no REM behavior disorder. Mood is pretty good, no paranoia or hallucinations.   I personally reviewed MRI brain with and without contrast done 03/2020 which did not show any acute changes. There was diffuse atrophy with ventricles slightly prominent relative to sulci, moderate chronic microvascular disease.   PAST MEDICAL HISTORY: Past Medical History:  Diagnosis Date  . Arthritis   . Closed fracture of left olecranon process 07/28/2018  . Diverticulosis of colon   . Eczema   . Hypertension   . Left elbow fracture   . S/P total knee replacement, left     PAST SURGICAL HISTORY: Past Surgical History:  Procedure Laterality Date  . ABDOMINAL HYSTERECTOMY  1980   BSO  . APPENDECTOMY  1980  . knee arthoscopy  2010   right  . KNEE ARTHROSCOPY  2001   left  . ORIF ELBOW FRACTURE Left 07/28/2018   Procedure: OPEN REDUCTION INTERNAL FIXATION (ORIF) ELBOW/OLECRANON FRACTURE;  Surgeon: Teryl Lucy, MD;  Location: [facility];  Service: Orthopedics;  Laterality: Left;  . TOTAL KNEE ARTHROPLASTY Left 08/12/2016   Procedure: LEFT TOTAL  KNEE ARTHROPLASTY;  Surgeon: Salvatore Marvel, MD;  Location: [facility] OR;  Service: Orthopedics;  Laterality: Left;  . TOTAL KNEE ARTHROPLASTY Right 06/28/2019   Procedure: TOTAL KNEE ARTHROPLASTY;  Surgeon: Salvatore Marvel, MD;  Location: [facility];  Service: Orthopedics;  Laterality: Right;    MEDICATIONS: Current  Outpatient Medications on File Prior to Visit  Medication Sig Dispense Refill  . amLODipine (NORVASC) 2.5 MG tablet 1 tablet    . Biotin 1000 MCG tablet Take 1,000 mcg by mouth daily.     . Cholecalciferol (VITAMIN D3) 50 MCG (2000 UT) TABS Take 2,000 Units by mouth daily.    . Cyanocobalamin (VITAMIN B12) 1000 MCG TBCR 1 tablet    . Folic Acid 5 MG CAPS Take 5 mg by mouth daily.      No current facility-administered medications on file prior to visit.    ALLERGIES: No Known Allergies  FAMILY HISTORY: Family History  Problem Relation Age of Onset  . Pneumonia Mother 32  . Heart attack Father 5       Possibly CAD starting in his 40s  . Parkinson's disease Brother     SOCIAL HISTORY: Social History   Socioeconomic History  . Marital status: Married    Spouse name: Not on file  . Number of children: Not on file  . Years of education: Not on file  . Highest education level: Not on file  Occupational History  . Not on file  Tobacco Use  . Smoking status: Never Smoker  . Smokeless tobacco: Never Used  Vaping Use  . Vaping Use: Never used  Substance and Sexual Activity  . Alcohol use: No  . Drug use: No  . Sexual activity: Not on file  Other Topics Concern  . Not on file  Social History Narrative   Married, lives with her husband Molly Maduro at [facility], right handed    Social Determinants of Health   Financial Resource Strain: Not on file  Food Insecurity: Not on file  Transportation Needs: Not on file  Physical Activity: Not on file  Stress: Not on file  Social Connections: Not on file  Intimate Partner Violence: Not on file     PHYSICAL EXAM: Vitals:   06/23/20 1349  BP: (!) 172/71  Pulse: 81  SpO2: 100%   General: No acute distress Head:  Normocephalic/atraumatic Skin/Extremities: No rash, no edema Neurological Exam: Mental status: alert and oriented to person, place, and time, no dysarthria or aphasia, Fund of knowledge is reduced.  Recent and  remote memory are impaired.  Attention and concentration are reduced.  Able to name objects and repeat phrases. MOCA score 11/30 Montreal Cognitive Assessment  06/23/2020  Visuospatial/ Executive (0/5) 0  Naming (0/3) 2  Attention: Read list of digits (0/2) 1  Attention: Read list of letters (0/1) 1  Attention: Serial 7 subtraction starting at 100 (0/3) 1  Language: Repeat phrase (0/2) 1  Language : Fluency (0/1) 0  Abstraction (0/2) 0  Delayed Recall (0/5) 0  Orientation (0/6) 5  Total 11  Adjusted Score (based on education) 11    Cranial nerves: CN I: not tested CN II: pupils equal, round and reactive to light, visual fields intact CN III, IV, VI:  full range of motion, no nystagmus, no ptosis CN V: decreased cold on right V1, left V2 CN VII: upper and lower face symmetric CN VIII: hearing intact to conversation CN XI: sternocleidomastoid and trapezius muscles intact CN XII: tongue midline Bulk & Tone: normal, no  fasciculations. Motor: 5/5 throughout with no pronator drift. Sensation: intact to light touch, cold, pin, vibration sense.  No extinction to double simultaneous stimulation.  Romberg test negative Deep Tendon Reflexes: +1 throughout Cerebellar: no incoordination on finger to nose testing Gait: slightly wide-based, slow and cautious, no ataxia. Gait is not typical magnetic gait Tremor: none   IMPRESSION: This is an 87 year old right-handed woman with a history of hypertension, hyperlipidemia, vocal cord atrophy, presenting for evaluation of memory loss and slight ventriculomegaly on brain MRI. Her neurological exam is non-focal, she does not have typical magnetic gait seen with normal pressure hydrocephalus (NPH). MOCA score today 11/30. We reviewed MRI brain with generalized diffuse atrophy and slightly enlarged ventricles noted, however discussed that NPH is less likely cause of mild dementia. More likely it is due to Alzheimer's disease. We discussed starting  Donepezil, including side effects and expectations. Since she recently started [facility] for psoriasis, we agreed to start one medication at a time to monitor side effects, she will start Donepezil in another 2 weeks, take 1/2 tab daily for 2 weeks, then increase to 1 tablet daily. Recommend physical therapy/balance therapy. Follow-up in 6-8 months, call for any changes.   Thank you for allowing me to participate in the care of this patient. Please do not hesitate to call for any questions or concerns.   Patrcia Dolly, M.D.  CC: Dr. Pete Glatter

## 2020-06-23 NOTE — Patient Instructions (Signed)
1. Since you just started [facility], wait 2 weeks before starting memory medication Donepezil to see if you have any side effects on Otezla. Start Donepezil 10mg : take 1/2 tablet daily for 2 weeks, then increase to 1 tablet daily  2. Please check on physical therapy/balance therapy at [facility], if none, please let know and we will send an order for home physical therapy  3. Follow-up in 6-8 months, call for any changes   FALL PRECAUTIONS: Be cautious when walking. Scan the area for obstacles that may increase the risk of trips and falls. When getting up in the mornings, sit up at the edge of the bed for a few minutes before getting out of bed. Consider elevating the bed at the head end to avoid drop of blood pressure when getting up. Walk always in a well-lit room (use night lights in the walls). Avoid area rugs or power cords from appliances in the middle of the walkways. Use a walker or a cane if necessary and consider physical therapy for balance exercise. Get your eyesight checked regularly.  FINANCIAL OVERSIGHT: Supervision, especially oversight when making financial decisions or transactions is also recommended.  HOME SAFETY: Consider the safety of the kitchen when operating appliances like stoves, microwave oven, and blender. Consider having supervision and share cooking responsibilities until no longer able to participate in those. Accidents with firearms and other hazards in the house should be identified and addressed as well.  DRIVING: Regarding driving, in patients with progressive memory problems, driving will be impaired. We advise to have someone else do the driving if trouble finding directions or if minor accidents are reported. Independent driving assessment is available to determine safety of driving.  ABILITY TO BE LEFT ALONE: If patient is unable to contact 911 operator, consider using LifeLine, or when the need is there, arrange for someone to stay with patients. Smoking is a  fire hazard, consider supervision or cessation. Risk of wandering should be assessed by caregiver and if detected at any point, supervision and safe proof recommendations should be instituted.  MEDICATION SUPERVISION: Inability to self-administer medication needs to be constantly addressed. Implement a mechanism to ensure safe administration of the medications.  RECOMMENDATIONS FOR ALL PATIENTS WITH MEMORY PROBLEMS: 1. Continue to exercise (Recommend 30 minutes of walking everyday, or 3 hours every week) 2. Increase social interactions - continue going to [place] and enjoy social gatherings with friends and family 3. Eat healthy, avoid fried foods and eat more fruits and vegetables 4. Maintain adequate blood pressure, blood sugar, and blood cholesterol level. Reducing the risk of stroke and cardiovascular disease also helps promoting better memory. 5. Avoid stressful situations. Live a simple life and avoid aggravations. Organize your time and prepare for the next day in anticipation. 6. Sleep well, avoid any interruptions of sleep and avoid any distractions in the bedroom that may interfere with adequate sleep quality 7. Avoid sugar, avoid sweets as there is a strong link between excessive sugar intake, diabetes, and cognitive impairment We discussed the Mediterranean diet, which has been shown to help patients reduce the risk of progressive memory disorders and reduces cardiovascular risk. This includes eating fish, eat fruits and green leafy vegetables, nuts like almonds and hazelnuts, walnuts, and also use olive oil. Avoid fast foods and fried foods as much as possible. Avoid sweets and sugar as sugar use has been linked to worsening of memory function.  There is always a concern of gradual progression of memory problems. If this is the case,  then we may need to adjust level of care according to patient needs. Support, both to the patient and caregiver, should then be put into place.

## 2020-07-05 DIAGNOSIS — Z111 Encounter for screening for respiratory tuberculosis: Secondary | ICD-10-CM | POA: Diagnosis not present

## 2020-08-15 DIAGNOSIS — Z23 Encounter for immunization: Secondary | ICD-10-CM | POA: Diagnosis not present

## 2020-08-24 DIAGNOSIS — N1831 Chronic kidney disease, stage 3a: Secondary | ICD-10-CM | POA: Diagnosis not present

## 2020-08-24 DIAGNOSIS — M1990 Unspecified osteoarthritis, unspecified site: Secondary | ICD-10-CM | POA: Diagnosis not present

## 2020-08-24 DIAGNOSIS — M810 Age-related osteoporosis without current pathological fracture: Secondary | ICD-10-CM | POA: Diagnosis not present

## 2020-08-24 DIAGNOSIS — I129 Hypertensive chronic kidney disease with stage 1 through stage 4 chronic kidney disease, or unspecified chronic kidney disease: Secondary | ICD-10-CM | POA: Diagnosis not present

## 2020-08-24 DIAGNOSIS — K219 Gastro-esophageal reflux disease without esophagitis: Secondary | ICD-10-CM | POA: Diagnosis not present

## 2020-09-19 DIAGNOSIS — N1831 Chronic kidney disease, stage 3a: Secondary | ICD-10-CM | POA: Diagnosis not present

## 2020-09-19 DIAGNOSIS — I129 Hypertensive chronic kidney disease with stage 1 through stage 4 chronic kidney disease, or unspecified chronic kidney disease: Secondary | ICD-10-CM | POA: Diagnosis not present

## 2020-09-19 DIAGNOSIS — I700 Atherosclerosis of aorta: Secondary | ICD-10-CM | POA: Diagnosis not present

## 2020-09-25 DIAGNOSIS — K219 Gastro-esophageal reflux disease without esophagitis: Secondary | ICD-10-CM | POA: Diagnosis not present

## 2020-09-25 DIAGNOSIS — I129 Hypertensive chronic kidney disease with stage 1 through stage 4 chronic kidney disease, or unspecified chronic kidney disease: Secondary | ICD-10-CM | POA: Diagnosis not present

## 2020-09-25 DIAGNOSIS — M810 Age-related osteoporosis without current pathological fracture: Secondary | ICD-10-CM | POA: Diagnosis not present

## 2020-09-25 DIAGNOSIS — N1831 Chronic kidney disease, stage 3a: Secondary | ICD-10-CM | POA: Diagnosis not present

## 2020-09-25 DIAGNOSIS — M1990 Unspecified osteoarthritis, unspecified site: Secondary | ICD-10-CM | POA: Diagnosis not present

## 2020-11-10 DIAGNOSIS — K219 Gastro-esophageal reflux disease without esophagitis: Secondary | ICD-10-CM | POA: Diagnosis not present

## 2020-11-10 DIAGNOSIS — M810 Age-related osteoporosis without current pathological fracture: Secondary | ICD-10-CM | POA: Diagnosis not present

## 2020-11-10 DIAGNOSIS — I129 Hypertensive chronic kidney disease with stage 1 through stage 4 chronic kidney disease, or unspecified chronic kidney disease: Secondary | ICD-10-CM | POA: Diagnosis not present

## 2020-11-10 DIAGNOSIS — M1990 Unspecified osteoarthritis, unspecified site: Secondary | ICD-10-CM | POA: Diagnosis not present

## 2020-11-10 DIAGNOSIS — N1831 Chronic kidney disease, stage 3a: Secondary | ICD-10-CM | POA: Diagnosis not present

## 2020-11-16 DIAGNOSIS — L509 Urticaria, unspecified: Secondary | ICD-10-CM | POA: Diagnosis not present

## 2020-11-21 DIAGNOSIS — I129 Hypertensive chronic kidney disease with stage 1 through stage 4 chronic kidney disease, or unspecified chronic kidney disease: Secondary | ICD-10-CM | POA: Diagnosis not present

## 2020-11-21 DIAGNOSIS — N1831 Chronic kidney disease, stage 3a: Secondary | ICD-10-CM | POA: Diagnosis not present

## 2020-12-19 DIAGNOSIS — L820 Inflamed seborrheic keratosis: Secondary | ICD-10-CM | POA: Diagnosis not present

## 2020-12-19 DIAGNOSIS — L720 Epidermal cyst: Secondary | ICD-10-CM | POA: Diagnosis not present

## 2020-12-19 DIAGNOSIS — L400 Psoriasis vulgaris: Secondary | ICD-10-CM | POA: Diagnosis not present

## 2021-01-11 DIAGNOSIS — L400 Psoriasis vulgaris: Secondary | ICD-10-CM | POA: Diagnosis not present

## 2021-01-12 DIAGNOSIS — Z23 Encounter for immunization: Secondary | ICD-10-CM | POA: Diagnosis not present

## 2021-01-19 ENCOUNTER — Ambulatory Visit: Payer: Medicare Other | Admitting: Neurology

## 2021-01-19 DIAGNOSIS — Z23 Encounter for immunization: Secondary | ICD-10-CM | POA: Diagnosis not present

## 2021-02-02 DIAGNOSIS — L400 Psoriasis vulgaris: Secondary | ICD-10-CM | POA: Diagnosis not present

## 2021-02-09 DIAGNOSIS — I129 Hypertensive chronic kidney disease with stage 1 through stage 4 chronic kidney disease, or unspecified chronic kidney disease: Secondary | ICD-10-CM | POA: Diagnosis not present

## 2021-02-09 DIAGNOSIS — Z79899 Other long term (current) drug therapy: Secondary | ICD-10-CM | POA: Diagnosis not present

## 2021-02-09 DIAGNOSIS — Z0000 Encounter for general adult medical examination without abnormal findings: Secondary | ICD-10-CM | POA: Diagnosis not present

## 2021-02-09 DIAGNOSIS — M810 Age-related osteoporosis without current pathological fracture: Secondary | ICD-10-CM | POA: Diagnosis not present

## 2021-02-09 DIAGNOSIS — K219 Gastro-esophageal reflux disease without esophagitis: Secondary | ICD-10-CM | POA: Diagnosis not present

## 2021-02-09 DIAGNOSIS — N1831 Chronic kidney disease, stage 3a: Secondary | ICD-10-CM | POA: Diagnosis not present

## 2021-02-09 DIAGNOSIS — I700 Atherosclerosis of aorta: Secondary | ICD-10-CM | POA: Diagnosis not present

## 2021-02-13 DIAGNOSIS — I129 Hypertensive chronic kidney disease with stage 1 through stage 4 chronic kidney disease, or unspecified chronic kidney disease: Secondary | ICD-10-CM | POA: Diagnosis not present

## 2021-04-25 DIAGNOSIS — L400 Psoriasis vulgaris: Secondary | ICD-10-CM | POA: Diagnosis not present

## 2021-06-25 ENCOUNTER — Telehealth: Payer: Self-pay | Admitting: Neurology

## 2021-06-25 ENCOUNTER — Other Ambulatory Visit: Payer: Self-pay | Admitting: Neurology

## 2021-06-25 MED ORDER — DONEPEZIL HCL 10 MG PO TABS: ORAL_TABLET | ORAL | 3 refills | Status: DC

## 2021-06-25 NOTE — Telephone Encounter (Signed)
Patient has made an app for July. Needs a reill on her donepezil 10mg . ?Pharmacy- [facility] ?

## 2021-06-25 NOTE — Telephone Encounter (Signed)
Refill sent in for pt. 

## 2021-06-26 DIAGNOSIS — L400 Psoriasis vulgaris: Secondary | ICD-10-CM | POA: Diagnosis not present

## 2021-07-20 DIAGNOSIS — L0889 Other specified local infections of the skin and subcutaneous tissue: Secondary | ICD-10-CM | POA: Diagnosis not present

## 2021-07-20 DIAGNOSIS — L0109 Other impetigo: Secondary | ICD-10-CM | POA: Diagnosis not present

## 2021-07-20 DIAGNOSIS — L400 Psoriasis vulgaris: Secondary | ICD-10-CM | POA: Diagnosis not present

## 2021-08-13 DIAGNOSIS — I129 Hypertensive chronic kidney disease with stage 1 through stage 4 chronic kidney disease, or unspecified chronic kidney disease: Secondary | ICD-10-CM | POA: Diagnosis not present

## 2021-08-13 DIAGNOSIS — I700 Atherosclerosis of aorta: Secondary | ICD-10-CM | POA: Diagnosis not present

## 2021-08-13 DIAGNOSIS — N1831 Chronic kidney disease, stage 3a: Secondary | ICD-10-CM | POA: Diagnosis not present

## 2021-08-20 DIAGNOSIS — L400 Psoriasis vulgaris: Secondary | ICD-10-CM | POA: Diagnosis not present

## 2021-09-20 DIAGNOSIS — Z79899 Other long term (current) drug therapy: Secondary | ICD-10-CM | POA: Diagnosis not present

## 2021-09-20 DIAGNOSIS — L400 Psoriasis vulgaris: Secondary | ICD-10-CM | POA: Diagnosis not present

## 2021-10-08 ENCOUNTER — Ambulatory Visit: Payer: Medicare Other | Admitting: Neurology

## 2021-10-15 ENCOUNTER — Ambulatory Visit (INDEPENDENT_AMBULATORY_CARE_PROVIDER_SITE_OTHER): Payer: Medicare Other | Admitting: Physician Assistant

## 2021-10-15 ENCOUNTER — Encounter: Payer: Self-pay | Admitting: Physician Assistant

## 2021-10-15 VITALS — BP 152/72 | HR 83 | Resp 20 | Ht 66.0 in | Wt 149.0 lb

## 2021-10-15 DIAGNOSIS — G309 Alzheimer's disease, unspecified: Secondary | ICD-10-CM | POA: Diagnosis not present

## 2021-10-15 DIAGNOSIS — F0280 Dementia in other diseases classified elsewhere without behavioral disturbance: Secondary | ICD-10-CM

## 2021-10-15 NOTE — Progress Notes (Signed)
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 RH female with a history of hypertension, hyperlipidemia, vocal cord atrophy seen today in follow up for memory loss and slight ventriculomegaly on brain MRI without NPH symptoms. Last MoCA in March 2022 was 11/30. Brain MRI showed generalized diffuse atrophy.  Patient is currently on donepezil 10 mg daily    Recommendations:    Continue donepezil 10 mg daily. Side effects were discussed Follow up in 6 months.   Case discussed with Dr. Karel Jarvis who agrees with the plan     Subjective:    This patient is accompanied in the office by her husband and son who supplement the history.  Previous records as well as any outside records available were reviewed prior to todays visit.Patient was last seen at our office on 06/23/20  at which time her MoCA was 11/30    Any changes in memory since last visit? same Patient lives with: husband at [name] repeats oneself?  Endorsed Disoriented when walking into a room?  Patient denies   Leaving objects in unusual places?  Patient denies   Ambulates  with difficulty?   Patient denies   Recent falls?  Patient denies   Any head injuries?  Patient denies   History of seizures?   Patient denies   Wandering behavior?  Patient denies   Patient drives?   Patient no longer drives  Any mood changes such irritability agitation?  Patient denies   Any history of depression?:  Patient denies   Hallucinations?  Patient denies   Paranoia?  Patient denies   Patient reports that he sleeps well without vivid dreams, REM behavior or sleepwalking   History of sleep apnea?  Patient denies   Any hygiene concerns?  Patient denies   Independent of bathing and dressing? She needs assistance with showering, a caregiver comes 3 x  week.  Margorie John TH Does the patient needs help with medications?  Son helps place the meds in a pillbox  Who is in charge of the  finances? Husband  is in charge    Any changes in appetite?  Patient denies   Patient have trouble swallowing? Patient denies   Does the patient cook?  Patient denies   Any kitchen accidents such as leaving the stove on? Patient denies   Any headaches?  Patient denies   Double vision? Patient denies   Any focal numbness or tingling?  Patient denies   Chronic back pain Patient denies   Unilateral weakness?  Patient denies   Any tremors?  Patient denies   Any history of anosmia?  Patient denies   Any incontinence of urine? Endorsed, wears Depends Any bowel dysfunction?   Patient denies        HISTORY OF PRESENT ILLNESS 06/23/20: This is an 88 year old right-handed woman with a history of hypertension, hyperlipidemia, vocal cord atrophy, presenting for evaluation of memory loss and slight ventriculomegaly on brain MRI. Her husband is present and her son Shannon Mcintyre from [place] is on speakerphone to provide additional information. She feels her memory is "pretty good as far as I can tell." Her husband started noticing memory changes around a year ago, however Shannon Mcintyre started noticing minor changes 5 years ago where she would pick up the wrong purse or ask the same question. She was misplacing things and asking where to put things back. They would find objects where they don't belong. Cooking was  becoming an issue, she would leave the stove on. She stated she manages her own medications, Shannon Mcintyre reports medications are put in her pillbox and they give it to her AM and PM. Her husband manages finances. She stopped driving after her knee surgery, "they won't let me drive," denies getting lost. Family agrees her sense of direction is perfect but reaction times are slower. They moved to [facility] a year ago. A caregiver comes three times a week to help with showering. She is independent with dressing. There is no family history of dementia. No history of significant head injuries or alcohol use.   She  denies any headaches, dizziness, diplopia, dysarthria, dysphagia, neck/back pain, focal numbness/tingling/weakness, anosmia, tremors. She has had problems with incontinence, there has been some improvement but "not fullproof," she still wears Depends and wakes up wet in the morning. They deny any falls, she states her walking could be better, she can't walk straight and sometimes has trouble walking. She did not want to do physical therapy after her 2 knee replacements and "walks like a cowboy." Sleep is good, no REM behavior disorder. Mood is pretty good, no paranoia or hallucinations.    I personally reviewed MRI brain with and without contrast done 03/2020 which did not show any acute changes. There was diffuse atrophy with ventricles slightly prominent relative to sulci, moderate chronic microvascular disease.  PREVIOUS MEDICATIONS:   CURRENT MEDICATIONS:  Outpatient Encounter Medications as of 10/15/2021  Medication Sig   amLODipine (NORVASC) 2.5 MG tablet 1 tablet   Apremilast (OTEZLA PO) Take by mouth.   Biotin 1000 MCG tablet Take 1,000 mcg by mouth daily.    Cholecalciferol (VITAMIN D3) 50 MCG (2000 UT) TABS Take 2,000 Units by mouth daily.   Cyanocobalamin (VITAMIN B12) 1000 MCG TBCR 1 tablet   donepezil (ARICEPT) 10 MG tablet Take 1/2 tablet daily for 2 weeks, then increase to 1 tablet daily   Folic Acid 5 MG CAPS Take 5 mg by mouth daily.    No facility-administered encounter medications on file as of 10/15/2021.        No data to display            06/23/2020    2:00 PM  Montreal Cognitive Assessment   Visuospatial/ Executive (0/5) 0  Naming (0/3) 2  Attention: Read list of digits (0/2) 1  Attention: Read list of letters (0/1) 1  Attention: Serial 7 subtraction starting at 100 (0/3) 1  Language: Repeat phrase (0/2) 1  Language : Fluency (0/1) 0  Abstraction (0/2) 0  Delayed Recall (0/5) 0  Orientation (0/6) 5  Total 11  Adjusted Score (based on education) 11     Objective:     PHYSICAL EXAMINATION:    VITALS:  There were no vitals filed for this visit.  GEN:  The patient appears stated age and is in NAD. HEENT:  Normocephalic, atraumatic.   Neurological examination:  General: NAD, well-groomed, appears stated age. Orientation: The patient is alert. Oriented to person, place and date Cranial nerves: There is good facial symmetry.The speech is fluent and clear. No aphasia or dysarthria. Fund of knowledge is appropriate. Recent and remote memory are impaired. Attention and concentration are reduced.  Able to name objects and repeat phrases.  Hearing is intact to conversational tone.    Sensation: Sensation is intact to light touch throughout Motor: Strength is at least antigravity x4. Tremors: none  DTR's 1/4 in UE/LE     Movement examination: Tone: There is normal  tone in the UE/LE Abnormal movements:  no tremor.  No myoclonus.  No asterixis.   Coordination:  There is no decremation with RAM's. Normal finger to nose  Gait and Station: The patient has no difficulty arising out of a deep-seated chair without the use of the hands. The patient's stride length is good.  Gait is cautious and slightly wide based, not typical magnetic gait   Thank you for allowing [place] the opportunity to participate in the care of this nice patient. Please do not hesitate to contact us for any questions or concerns.   Total time spent on today's visit was *** minutes dedicated to this patient today, preparing to see patient, examining the patient, ordering tests and/or medications and counseling the patient, documenting clinical information in the EHR or other health record, independently interpreting results and communicating results to the patient/family, discussing treatment and goals, answering patient's questions and coordinating care.  Cc:  Merlene Laughter, MD  Marlowe Kays 10/15/2021 1:13 PM

## 2021-10-15 NOTE — Patient Instructions (Addendum)
It was a pleasure to see you today at our office.   Recommendations:  Follow up in  6 months Continue donepezil 10 mg daily. Side effects were discussed    Whom to call:  Memory  decline, memory medications: Call our office [phone]   For psychiatric meds, mood meds: Please have your primary care physician manage these medications.   Counseling regarding caregiver distress, including caregiver depression, anxiety and issues regarding community resources, adult day care programs, adult living facilities, or memory care questions:   Feel free to contact Misty Lisabeth Register, Social Worker at [phone]   For assessment of decision of mental capacity and competency:  Call Dr. Erick Blinks, geriatric psychiatrist at [phone]  For guidance in geriatric dementia issues please call [facility] [phone]  For guidance regarding [name] and if placement were needed at the facility, contact Sidney Ace, Social Worker tel: [phone]  If you have any severe symptoms of a stroke, or other severe issues such as confusion,severe chills or fever, etc call 911 or go to the ER as you may need to be evaluated further   Feel free to visit Facebook page " [employer]" for tips of how to care for people with memory problems.   Feel free to go to the following database for funded clinical studies conducted around the world: RankChecks.se   https://www.triadclinicaltrials.com/     RECOMMENDATIONS FOR ALL PATIENTS WITH MEMORY PROBLEMS: 1. Continue to exercise (Recommend 30 minutes of walking everyday, or 3 hours every week) 2. Increase social interactions - continue going to [place] and enjoy social gatherings with friends and family 3. Eat healthy, avoid fried foods and eat more fruits and vegetables 4. Maintain adequate blood pressure, blood sugar, and blood cholesterol level. Reducing the risk of stroke and cardiovascular disease also  helps promoting better memory. 5. Avoid stressful situations. Live a simple life and avoid aggravations. Organize your time and prepare for the next day in anticipation. 6. Sleep well, avoid any interruptions of sleep and avoid any distractions in the bedroom that may interfere with adequate sleep quality 7. Avoid sugar, avoid sweets as there is a strong link between excessive sugar intake, diabetes, and cognitive impairment We discussed the Mediterranean diet, which has been shown to help patients reduce the risk of progressive memory disorders and reduces cardiovascular risk. This includes eating fish, eat fruits and green leafy vegetables, nuts like almonds and hazelnuts, walnuts, and also use olive oil. Avoid fast foods and fried foods as much as possible. Avoid sweets and sugar as sugar use has been linked to worsening of memory function.  There is always a concern of gradual progression of memory problems. If this is the case, then we may need to adjust level of care according to patient needs. Support, both to the patient and caregiver, should then be put into place.    The Alzheimer's Association is here all day, every day for people facing Alzheimer's disease through our free 24/7 Helpline: [phone]. The Helpline provides reliable information and support to all those who need assistance, such as individuals living with memory loss, Alzheimer's or other dementia, caregivers, health care professionals and the public.  Our highly trained and knowledgeable staff can help you with: Understanding memory loss, dementia and Alzheimer's  Medications and other treatment options  General information about aging and brain health  Skills to provide quality care and to find the best care from professionals  Legal, financial and living-arrangement decisions Our Helpline also  features: Confidential care consultation provided by master's level clinicians who can help with decision-making support, crisis  assistance and education on issues families face every day  Help in a caller's preferred language using our translation service that features more than 200 languages and dialects  Referrals to local community programs, services and ongoing support     FALL PRECAUTIONS: Be cautious when walking. Scan the area for obstacles that may increase the risk of trips and falls. When getting up in the mornings, sit up at the edge of the bed for a few minutes before getting out of bed. Consider elevating the bed at the head end to avoid drop of blood pressure when getting up. Walk always in a well-lit room (use night lights in the walls). Avoid area rugs or power cords from appliances in the middle of the walkways. Use a walker or a cane if necessary and consider physical therapy for balance exercise. Get your eyesight checked regularly.  FINANCIAL OVERSIGHT: Supervision, especially oversight when making financial decisions or transactions is also recommended.  HOME SAFETY: Consider the safety of the kitchen when operating appliances like stoves, microwave oven, and blender. Consider having supervision and share cooking responsibilities until no longer able to participate in those. Accidents with firearms and other hazards in the house should be identified and addressed as well.   ABILITY TO BE LEFT ALONE: If patient is unable to contact 911 operator, consider using LifeLine, or when the need is there, arrange for someone to stay with patients. Smoking is a fire hazard, consider supervision or cessation. Risk of wandering should be assessed by caregiver and if detected at any point, supervision and safe proof recommendations should be instituted.  MEDICATION SUPERVISION: Inability to self-administer medication needs to be constantly addressed. Implement a mechanism to ensure safe administration of the medications.   DRIVING: Regarding driving, in patients with progressive memory problems, driving will be  impaired. We advise to have someone else do the driving if trouble finding directions or if minor accidents are reported. Independent driving assessment is available to determine safety of driving.   If you are interested in the driving assessment, you can contact the following:  [facility] [employer] [facility] in [place] [phone]  [facility] [phone]  [facility] [phone] or [phone]      Mediterranean Diet A Mediterranean diet refers to food and lifestyle choices that are based on the traditions of countries located on the [employer]. This way of eating has been shown to help prevent certain conditions and improve outcomes for people who have chronic diseases, like kidney disease and heart disease. What are tips for following this plan? Lifestyle  Cook and eat meals together with your family, when possible. Drink enough fluid to keep your urine clear or pale yellow. Be physically active every day. This includes: Aerobic exercise like running or swimming. Leisure activities like gardening, walking, or housework. Get 7-8 hours of sleep each night. If recommended by your health care provider, drink red wine in moderation. This means 1 glass a day for nonpregnant women and 2 glasses a day for men. A glass of wine equals 5 oz (150 mL). Reading food labels  Check the serving size of packaged foods. For foods such as rice and pasta, the serving size refers to the amount of cooked product, not dry. Check the total fat in packaged foods. Avoid foods that have saturated fat or trans fats. Check the ingredients list for added sugars,  such as corn syrup. Shopping  At the grocery store, buy most of your food from the areas near the walls of the store. This includes: Fresh fruits and vegetables (produce). Grains, beans, nuts, and seeds. Some of these may be available in unpackaged forms or large amounts (in  bulk). Fresh seafood. Poultry and eggs. Low-fat dairy products. Buy whole ingredients instead of prepackaged foods. Buy fresh fruits and vegetables in-season from local farmers markets. Buy frozen fruits and vegetables in resealable bags. If you do not have access to quality fresh seafood, buy precooked frozen shrimp or canned fish, such as tuna, salmon, or sardines. Buy small amounts of raw or cooked vegetables, salads, or olives from the deli or salad bar at your store. Stock your pantry so you always have certain foods on hand, such as olive oil, canned tuna, canned tomatoes, rice, pasta, and beans. Cooking  Cook foods with extra-virgin olive oil instead of using butter or other vegetable oils. Have meat as a side dish, and have vegetables or grains as your main dish. This means having meat in small portions or adding small amounts of meat to foods like pasta or stew. Use beans or vegetables instead of meat in common dishes like chili or lasagna. Experiment with different cooking methods. Try roasting or broiling vegetables instead of steaming or sauteing them. Add frozen vegetables to soups, stews, pasta, or rice. Add nuts or seeds for added healthy fat at each meal. You can add these to yogurt, salads, or vegetable dishes. Marinate fish or vegetables using olive oil, lemon juice, garlic, and fresh herbs. Meal planning  Plan to eat 1 vegetarian meal one day each week. Try to work up to 2 vegetarian meals, if possible. Eat seafood 2 or more times a week. Have healthy snacks readily available, such as: Vegetable sticks with hummus. Greek yogurt. Fruit and nut trail mix. Eat balanced meals throughout the week. This includes: Fruit: 2-3 servings a day Vegetables: 4-5 servings a day Low-fat dairy: 2 servings a day Fish, poultry, or lean meat: 1 serving a day Beans and legumes: 2 or more servings a week Nuts and seeds: 1-2 servings a day Whole grains: 6-8 servings a day Extra-virgin  olive oil: 3-4 servings a day Limit red meat and sweets to only a few servings a month What are my food choices? Mediterranean diet Recommended Grains: Whole-grain pasta. Brown rice. Bulgar wheat. Polenta. Couscous. Whole-wheat bread. Orpah Cobb. Vegetables: Artichokes. Beets. Broccoli. Cabbage. Carrots. Eggplant. Green beans. Chard. Kale. Spinach. Onions. Leeks. Peas. Squash. Tomatoes. Peppers. Radishes. Fruits: Apples. Apricots. Avocado. Berries. Bananas. Cherries. Dates. Figs. Grapes. Lemons. Melon. Oranges. Peaches. Plums. Pomegranate. Meats and other protein foods: Beans. Almonds. Sunflower seeds. Pine nuts. Peanuts. Cod. Salmon. Scallops. Shrimp. Tuna. Tilapia. Clams. Oysters. Eggs. Dairy: Low-fat milk. Cheese. Greek yogurt. Beverages: Water. Red wine. Herbal tea. Fats and oils: Extra virgin olive oil. Avocado oil. Grape seed oil. Sweets and desserts: [place] yogurt with honey. Baked apples. Poached pears. Trail mix. Seasoning and other foods: Basil. Cilantro. Coriander. Cumin. Mint. Parsley. Sage. Rosemary. Tarragon. Garlic. Oregano. Thyme. Pepper. Balsalmic vinegar. Tahini. Hummus. Tomato sauce. Olives. Mushrooms. Limit these Grains: Prepackaged pasta or rice dishes. Prepackaged cereal with added sugar. Vegetables: Deep fried potatoes (french fries). Fruits: Fruit canned in syrup. Meats and other protein foods: Beef. Pork. Lamb. Poultry with skin. Hot dogs. Tomasa Blase. Dairy: Ice cream. Sour cream. Whole milk. Beverages: Juice. Sugar-sweetened soft drinks. Beer. Liquor and spirits. Fats and oils: Butter. [place] oil. Vegetable oil. Beef fat (  tallow). Lard. Sweets and desserts: Cookies. Cakes. Pies. Candy. Seasoning and other foods: Mayonnaise. Premade sauces and marinades. The items listed may not be a complete list. Talk with your dietitian about what dietary choices are right for you. Summary The Mediterranean diet includes both food and lifestyle choices. Eat a variety of fresh  fruits and vegetables, beans, nuts, seeds, and whole grains. Limit the amount of red meat and sweets that you eat. Talk with your health care provider about whether it is safe for you to drink red wine in moderation. This means 1 glass a day for nonpregnant women and 2 glasses a day for men. A glass of wine equals 5 oz (150 mL). This information is not intended to replace advice given to you by your health care provider. Make sure you discuss any questions you have with your health care provider. Document Released: 11/09/2015 Document Revised: 12/12/2015 Document Reviewed: 11/09/2015 Elsevier Interactive Patient Education  2017 [employer].

## 2021-10-21 ENCOUNTER — Other Ambulatory Visit: Payer: Self-pay | Admitting: Neurology

## 2021-11-23 DIAGNOSIS — L400 Psoriasis vulgaris: Secondary | ICD-10-CM | POA: Diagnosis not present

## 2021-11-23 DIAGNOSIS — Z79899 Other long term (current) drug therapy: Secondary | ICD-10-CM | POA: Diagnosis not present

## 2021-12-31 ENCOUNTER — Other Ambulatory Visit (HOSPITAL_BASED_OUTPATIENT_CLINIC_OR_DEPARTMENT_OTHER): Payer: Self-pay

## 2021-12-31 DIAGNOSIS — Z23 Encounter for immunization: Secondary | ICD-10-CM | POA: Diagnosis not present

## 2021-12-31 MED ORDER — FLUAD QUADRIVALENT 0.5 ML IM PRSY
PREFILLED_SYRINGE | INTRAMUSCULAR | 0 refills | Status: DC
  Filled 2021-12-31: qty 0.5, 1d supply, fill #0

## 2022-01-30 DIAGNOSIS — Z23 Encounter for immunization: Secondary | ICD-10-CM | POA: Diagnosis not present

## 2022-02-23 ENCOUNTER — Other Ambulatory Visit: Payer: Self-pay | Admitting: Physician Assistant

## 2022-02-26 DIAGNOSIS — L400 Psoriasis vulgaris: Secondary | ICD-10-CM | POA: Diagnosis not present

## 2022-02-26 DIAGNOSIS — Z79899 Other long term (current) drug therapy: Secondary | ICD-10-CM | POA: Diagnosis not present

## 2022-03-07 DIAGNOSIS — I129 Hypertensive chronic kidney disease with stage 1 through stage 4 chronic kidney disease, or unspecified chronic kidney disease: Secondary | ICD-10-CM | POA: Diagnosis not present

## 2022-03-07 DIAGNOSIS — Z0000 Encounter for general adult medical examination without abnormal findings: Secondary | ICD-10-CM | POA: Diagnosis not present

## 2022-03-07 DIAGNOSIS — R197 Diarrhea, unspecified: Secondary | ICD-10-CM | POA: Diagnosis not present

## 2022-03-07 DIAGNOSIS — K219 Gastro-esophageal reflux disease without esophagitis: Secondary | ICD-10-CM | POA: Diagnosis not present

## 2022-03-07 DIAGNOSIS — Z1331 Encounter for screening for depression: Secondary | ICD-10-CM | POA: Diagnosis not present

## 2022-03-07 DIAGNOSIS — M810 Age-related osteoporosis without current pathological fracture: Secondary | ICD-10-CM | POA: Diagnosis not present

## 2022-03-07 DIAGNOSIS — N1831 Chronic kidney disease, stage 3a: Secondary | ICD-10-CM | POA: Diagnosis not present

## 2022-03-07 DIAGNOSIS — Z79899 Other long term (current) drug therapy: Secondary | ICD-10-CM | POA: Diagnosis not present

## 2022-03-07 DIAGNOSIS — N3281 Overactive bladder: Secondary | ICD-10-CM | POA: Diagnosis not present

## 2022-03-07 DIAGNOSIS — I700 Atherosclerosis of aorta: Secondary | ICD-10-CM | POA: Diagnosis not present

## 2022-03-07 DIAGNOSIS — R739 Hyperglycemia, unspecified: Secondary | ICD-10-CM | POA: Diagnosis not present

## 2022-04-17 ENCOUNTER — Ambulatory Visit: Payer: Medicare Other | Admitting: Physician Assistant

## 2022-04-17 NOTE — Progress Notes (Incomplete)
Assessment/Plan:   Memory Impairment Shannon Mcintyre is a very pleasant 89 y.o. RH female with a history of hypertension, hyperlipidemia, vocal cord atrophy seen today in follow up for memory loss and slight ventriculomegaly on brain MRI without NPH symptoms.  Last brain MRI showed generalized diffuse atrophy.  Patient is currently on donepezil 10 mg daily.  Last MMSE on July 2023 was 25/30, stable from the prior visits.      Follow up in   months. Continue donepezil 10 mg daily, side effects discussed Continue to monitor mood as per PCP Continue PT for mobility and strength*** Continue to monitor cardiovascular risk factors    Subjective:    This patient is accompanied in the office by *** who supplements the history.  Previous records as well as any outside records available were reviewed prior to todays visit. Patient was last seen on ***   Any changes in memory since last visit? repeats oneself?  Endorsed Disoriented when walking into a room?  Patient denies except occasionally not remembering what patient came to the room for ***  Leaving objects in unusual places?    denies   Wandering behavior?  denies   Any personality changes since last visit?  denies   Any worsening depression?:  denies   Hallucinations or paranoia?  denies   Seizures?    denies    Any sleep changes?  Denies vivid dreams, REM behavior or sleepwalking   Sleep apnea?   denies   Any hygiene concerns?    denies   Independent of bathing and dressing?  She needs assistance with showering a caregiver comes 3 times a week on Monday Wednesday and Thursdays to help her. Does the patient needs help with medications?  Her son helps placing the medications in the pillbox*** Who is in charge of the finances?  Husband is in charge   *** Any changes in appetite?  denies ***   Patient have trouble swallowing?  denies   Does the patient cook?  Any kitchen accidents such as leaving the stove on? Patient denies    Any headaches?   denies   Chronic back pain  denies   Ambulates with difficulty?  She does physical therapy and walks about 45 minutes a day***recent falls or head injuries? denies     Unilateral weakness, numbness or tingling?    denies   Any tremors?  denies   Any anosmia?  Patient denies   Any incontinence of urine?  Endorsed, she wears depends Any bowel dysfunction?     denies      Patient lives  ***with her husband at [facility]. Does the patient drive?***   HISTORY OF PRESENT ILLNESS 06/23/20: This is an 89 year old right-handed woman with a history of hypertension, hyperlipidemia, vocal cord atrophy, presenting for evaluation of memory loss and slight ventriculomegaly on brain MRI. Her husband is present and her son [name] from [place] is on speakerphone to provide additional information. She feels her memory is "pretty good as far as I can tell." Her husband started noticing memory changes around a year ago, however [name] started noticing minor changes 5 years ago where she would pick up the wrong purse or ask the same question. She was misplacing things and asking where to put things back. They would find objects where they don't belong. Cooking was becoming an issue, she would leave the stove on. She stated she manages her own medications, [name] reports medications are put in her pillbox  and they give it to her AM and PM. Her husband manages finances. She stopped driving after her knee surgery, "they won't let me drive," denies getting lost. Family agrees her sense of direction is perfect but reaction times are slower. They moved to [facility] a year ago. A caregiver comes three times a week to help with showering. She is independent with dressing. There is no family history of dementia. No history of significant head injuries or alcohol use.   She denies any headaches, dizziness, diplopia, dysarthria, dysphagia, neck/back pain, focal numbness/tingling/weakness, anosmia,  tremors. She has had problems with incontinence, there has been some improvement but "not fullproof," she still wears Depends and wakes up wet in the morning. They deny any falls, she states her walking could be better, she can't walk straight and sometimes has trouble walking. She did not want to do physical therapy after her 2 knee replacements and "walks like a cowboy." Sleep is good, no REM behavior disorder. Mood is pretty good, no paranoia or hallucinations.    I personally reviewed MRI brain with and without contrast done 03/2020 which did not show any acute changes. There was diffuse atrophy with ventricles slightly prominent relative to sulci, moderate chronic microvascular disease.   PREVIOUS MEDICATIONS:   CURRENT MEDICATIONS:  Outpatient Encounter Medications as of 04/17/2022  Medication Sig   amLODipine (NORVASC) 2.5 MG tablet 1 tablet   Apremilast (OTEZLA PO) Take by mouth.   Biotin 1000 MCG tablet Take 1,000 mcg by mouth daily.    Cholecalciferol (VITAMIN D3) 50 MCG (2000 UT) TABS Take 2,000 Units by mouth daily.   Cyanocobalamin (VITAMIN B12) 1000 MCG TBCR 1 tablet   donepezil (ARICEPT) 10 MG tablet TAKE ONE TABLET BY MOUTH ONCE DAILY   Folic Acid 5 MG CAPS Take 5 mg by mouth daily.    influenza vaccine adjuvanted (FLUAD QUADRIVALENT) 0.5 ML injection Inject into the muscle.   No facility-administered encounter medications on file as of 04/17/2022.       10/16/2021   11:00 AM  MMSE - Mini Mental State Exam  Orientation to time 4  Orientation to Place 5  Registration 3  Attention/ Calculation 5  Recall 0  Language- name 2 objects 2  Language- repeat 1  Language- follow 3 step command 3  Language- read & follow direction 1  Write a sentence 1  Copy design 0  Total score 25      06/23/2020    2:00 PM  Montreal Cognitive Assessment   Visuospatial/ Executive (0/5) 0  Naming (0/3) 2  Attention: Read list of digits (0/2) 1  Attention: Read list of letters (0/1) 1   Attention: Serial 7 subtraction starting at 100 (0/3) 1  Language: Repeat phrase (0/2) 1  Language : Fluency (0/1) 0  Abstraction (0/2) 0  Delayed Recall (0/5) 0  Orientation (0/6) 5  Total 11  Adjusted Score (based on education) 11    Objective:     PHYSICAL EXAMINATION:    VITALS:  There were no vitals filed for this visit.  GEN:  The patient appears stated age and is in NAD. HEENT:  Normocephalic, atraumatic.   Neurological examination:  General: NAD, well-groomed, appears stated age. Orientation: The patient is alert. Oriented to person, place and date Cranial nerves: There is good facial symmetry.The speech is fluent and clear. No aphasia or dysarthria. Fund of knowledge is appropriate. Recent and remote memory are impaired. Attention and concentration are reduced.  Able to name objects and repeat  phrases.  Hearing is intact to conversational tone.    Sensation: Sensation is intact to light touch throughout Motor: Strength is at least antigravity x4. DTR's 2/4 in UE/LE     Movement examination: Tone: There is normal tone in the UE/LE Abnormal movements:  no tremor.  No myoclonus.  No asterixis.   Coordination:  There is no decremation with RAM's. Normal finger to nose  Gait and Station: The patient has no difficulty arising out of a deep-seated chair without the use of the hands. The patient's stride length is good.  Gait is cautious and wide-based, but no typical magnetic gait is noted***  Thank you for allowing [place] the opportunity to participate in the care of this nice patient. Please do not hesitate to contact us for any questions or concerns.   Total time spent on today's visit was *** minutes dedicated to this patient today, preparing to see patient, examining the patient, ordering tests and/or medications and counseling the patient, documenting clinical information in the EHR or other health record, independently interpreting results and communicating results to the  patient/family, discussing treatment and goals, answering patient's questions and coordinating care.  Cc:  Kathalene Frames, MD  Sharene Butters 04/17/2022 7:39 AM

## 2022-04-28 ENCOUNTER — Other Ambulatory Visit: Payer: Self-pay | Admitting: Physician Assistant

## 2022-04-29 ENCOUNTER — Ambulatory Visit: Payer: Medicare Other | Admitting: Neurology

## 2022-05-07 ENCOUNTER — Ambulatory Visit (INDEPENDENT_AMBULATORY_CARE_PROVIDER_SITE_OTHER): Payer: Medicare Other | Admitting: Physician Assistant

## 2022-05-07 ENCOUNTER — Encounter: Payer: Self-pay | Admitting: Physician Assistant

## 2022-05-07 VITALS — BP 148/64 | HR 77 | Ht 66.0 in | Wt 139.0 lb

## 2022-05-07 DIAGNOSIS — G309 Alzheimer's disease, unspecified: Secondary | ICD-10-CM | POA: Diagnosis not present

## 2022-05-07 DIAGNOSIS — F0280 Dementia in other diseases classified elsewhere without behavioral disturbance: Secondary | ICD-10-CM

## 2022-05-07 MED ORDER — MEMANTINE HCL 5 MG PO TABS: ORAL_TABLET | ORAL | 11 refills | Status: DC

## 2022-05-07 MED ORDER — DONEPEZIL HCL 10 MG PO TABS: ORAL_TABLET | ORAL | 3 refills | Status: DC

## 2022-05-07 NOTE — Patient Instructions (Signed)
It was a pleasure to see you today at our office.   Recommendations:  Follow up in  6 months Continue donepezil 10 mg daily. Side effects were discussed  Start Memantine 5mg  tablets.  Take 1 tablet at bedtime for 2 weeks, then 1 tablet twice daily.   Side effects include dizziness, headache,  constipation.  Call if severe symptoms appear    Whom to call:  Memory  decline, memory medications: Call our office [phone]   For psychiatric meds, mood meds: Please have your primary care physician manage these medications.   Counseling re  For assessment of decision of mental capacity and competency:  Call Dr. Anthoney Harada, geriatric psychiatrist at [phone]  For guidance in geriatric dementia issues please call [facility] [phone]     If you have any severe symptoms of a stroke, or other severe issues such as confusion,severe chills or fever, etc call 911 or go to the ER as you may need to be evaluated further   Feel free to visit Facebook page " [email or web address]" for tips of how to care for people with memory problems.      RECOMMENDATIONS FOR ALL PATIENTS WITH MEMORY PROBLEMS: 1. Continue to exercise (Recommend 30 minutes of walking everyday, or 3 hours every week) 2. Increase social interactions - continue going to [place] and enjoy social gatherings with friends and family 3. Eat healthy, avoid fried foods and eat more fruits and vegetables 4. Maintain adequate blood pressure, blood sugar, and blood cholesterol level. Reducing the risk of stroke and cardiovascular disease also helps promoting better memory. 5. Avoid stressful situations. Live a simple life and avoid aggravations. Organize your time and prepare for the next day in anticipation. 6. Sleep well, avoid any interruptions of sleep and avoid any distractions in the bedroom that may interfere with adequate sleep quality 7. Avoid sugar, avoid sweets as there is a strong link between excessive sugar intake,  diabetes, and cognitive impairment We discussed the Mediterranean diet, which has been shown to help patients reduce the risk of progressive memory disorders and reduces cardiovascular risk. This includes eating fish, eat fruits and green leafy vegetables, nuts like almonds and hazelnuts, walnuts, and also use olive oil. Avoid fast foods and fried foods as much as possible. Avoid sweets and sugar as sugar use has been linked to worsening of memory function.  There is always a concern of gradual progression of memory problems. If this is the case, then we may need to adjust level of care according to patient needs. Support, both to the patient and caregiver, should then be put into place.    The Alzheimer's Association is here all day, every day for people facing Alzheimer's disease through our free 24/7 Helpline: [phone]. The Helpline provides reliable information and support to all those who need assistance, such as individuals living with memory loss, Alzheimer's or other dementia, caregivers, health care professionals and the public.  Our highly trained and knowledgeable staff can help you with: Understanding memory loss, dementia and Alzheimer's  Medications and other treatment options  General information about aging and brain health  Skills to provide quality care and to find the best care from professionals  Legal, financial and living-arrangement decisions Our Helpline also features: Confidential care consultation provided by master's level clinicians who can help with decision-making support, crisis assistance and education on issues families face every day  Help in a caller's preferred language using our translation service that features more than 200 languages and  dialects  Referrals to local community programs, services and ongoing support     FALL PRECAUTIONS: Be cautious when walking. Scan the area for obstacles that may increase the risk of trips and falls. When getting up in  the mornings, sit up at the edge of the bed for a few minutes before getting out of bed. Consider elevating the bed at the head end to avoid drop of blood pressure when getting up. Walk always in a well-lit room (use night lights in the walls). Avoid area rugs or power cords from appliances in the middle of the walkways. Use a walker or a cane if necessary and consider physical therapy for balance exercise. Get your eyesight checked regularly.  FINANCIAL OVERSIGHT: Supervision, especially oversight when making financial decisions or transactions is also recommended.  HOME SAFETY: Consider the safety of the kitchen when operating appliances like stoves, microwave oven, and blender. Consider having supervision and share cooking responsibilities until no longer able to participate in those. Accidents with firearms and other hazards in the house should be identified and addressed as well.   ABILITY TO BE LEFT ALONE: If patient is unable to contact 911 operator, consider using LifeLine, or when the need is there, arrange for someone to stay with patients. Smoking is a fire hazard, consider supervision or cessation. Risk of wandering should be assessed by caregiver and if detected at any point, supervision and safe proof recommendations should be instituted.  MEDICATION SUPERVISION: Inability to self-administer medication needs to be constantly addressed. Implement a mechanism to ensure safe administration of the medications.   DRIVING: Regarding driving, in patients with progressive memory problems, driving will be impaired. We advise to have someone else do the driving if trouble finding directions or if minor accidents are reported. Independent driving assessment is available to determine safety of driving.   If you are interested in the driving assessment, you can contact the following:  [employer] in [place] [phone]  [facility] [phone]  [facility] [phone] or [phone]      Mediterranean Diet A Mediterranean diet refers to food and lifestyle choices that are based on the traditions of countries located on the [employer]. This way of eating has been shown to help prevent certain conditions and improve outcomes for people who have chronic diseases, like kidney disease and heart disease. What are tips for following this plan? Lifestyle  Cook and eat meals together with your family, when possible. Drink enough fluid to keep your urine clear or pale yellow. Be physically active every day. This includes: Aerobic exercise like running or swimming. Leisure activities like gardening, walking, or housework. Get 7-8 hours of sleep each night. If recommended by your health care provider, drink red wine in moderation. This means 1 glass a day for nonpregnant women and 2 glasses a day for men. A glass of wine equals 5 oz (150 mL). Reading food labels  Check the serving size of packaged foods. For foods such as rice and pasta, the serving size refers to the amount of cooked product, not dry. Check the total fat in packaged foods. Avoid foods that have saturated fat or trans fats. Check the ingredients list for added sugars, such as corn syrup. Shopping  At the grocery store, buy most of your food from the areas near the walls of the store. This includes: Fresh fruits and vegetables (produce). Grains, beans, nuts, and seeds. Some of these may be available in  unpackaged forms or large amounts (in bulk). Fresh seafood. Poultry and eggs. Low-fat dairy products. Buy whole ingredients instead of prepackaged foods. Buy fresh fruits and vegetables in-season from local farmers markets. Buy frozen fruits and vegetables in resealable bags. If you do not have access to quality fresh seafood, buy precooked frozen shrimp or canned fish, such as tuna, salmon, or sardines. Buy small amounts of raw or  cooked vegetables, salads, or olives from the deli or salad bar at your store. Stock your pantry so you always have certain foods on hand, such as olive oil, canned tuna, canned tomatoes, rice, pasta, and beans. Cooking  Cook foods with extra-virgin olive oil instead of using butter or other vegetable oils. Have meat as a side dish, and have vegetables or grains as your main dish. This means having meat in small portions or adding small amounts of meat to foods like pasta or stew. Use beans or vegetables instead of meat in common dishes like chili or lasagna. Experiment with different cooking methods. Try roasting or broiling vegetables instead of steaming or sauteing them. Add frozen vegetables to soups, stews, pasta, or rice. Add nuts or seeds for added healthy fat at each meal. You can add these to yogurt, salads, or vegetable dishes. Marinate fish or vegetables using olive oil, lemon juice, garlic, and fresh herbs. Meal planning  Plan to eat 1 vegetarian meal one day each week. Try to work up to 2 vegetarian meals, if possible. Eat seafood 2 or more times a week. Have healthy snacks readily available, such as: Vegetable sticks with hummus. Greek yogurt. Fruit and nut trail mix. Eat balanced meals throughout the week. This includes: Fruit: 2-3 servings a day Vegetables: 4-5 servings a day Low-fat dairy: 2 servings a day Fish, poultry, or lean meat: 1 serving a day Beans and legumes: 2 or more servings a week Nuts and seeds: 1-2 servings a day Whole grains: 6-8 servings a day Extra-virgin olive oil: 3-4 servings a day Limit red meat and sweets to only a few servings a month What are my food choices? Mediterranean diet Recommended Grains: Whole-grain pasta. Brown rice. Bulgar wheat. Polenta. Couscous. Whole-wheat bread. [place]. Vegetables: Artichokes. Beets. Broccoli. Cabbage. Carrots. Eggplant. Green beans. Chard. Kale. Spinach. Onions. Leeks. Peas. Squash. Tomatoes.  Peppers. Radishes. Fruits: Apples. Apricots. Avocado. Berries. Bananas. Cherries. Dates. Figs. Grapes. Lemons. Melon. Oranges. Peaches. Plums. Pomegranate. Meats and other protein foods: Beans. Almonds. Sunflower seeds. Pine nuts. Peanuts. Belle. Salmon. Scallops. Shrimp. [place]. Tilapia. Clams. Oysters. Eggs. Dairy: Low-fat milk. Cheese. Greek yogurt. Beverages: Water. Red wine. Herbal tea. Fats and oils: Extra virgin olive oil. Avocado oil. Grape seed oil. Sweets and desserts: [place] yogurt with honey. Baked apples. Poached pears. Trail mix. Seasoning and other foods: Basil. Cilantro. Coriander. Cumin. Mint. Parsley. Sage. Rosemary. Tarragon. Garlic. Oregano. Thyme. Pepper. Balsalmic vinegar. Tahini. Hummus. Tomato sauce. Olives. Mushrooms. Limit these Grains: Prepackaged pasta or rice dishes. Prepackaged cereal with added sugar. Vegetables: Deep fried potatoes (french fries). Fruits: Fruit canned in syrup. Meats and other protein foods: Beef. Pork. Lamb. Poultry with skin. Hot dogs. Berniece Salines. Dairy: Ice cream. Sour cream. Whole milk. Beverages: Juice. Sugar-sweetened soft drinks. Beer. Liquor and spirits. Fats and oils: Butter. Canola oil. Vegetable oil. Beef fat (tallow). Lard. Sweets and desserts: Cookies. Cakes. Pies. Candy. Seasoning and other foods: Mayonnaise. Premade sauces and marinades. The items listed may not be a complete list. Talk with your dietitian about what dietary choices are right for you. Summary The Mediterranean diet  includes both food and lifestyle choices. Eat a variety of fresh fruits and vegetables, beans, nuts, seeds, and whole grains. Limit the amount of red meat and sweets that you eat. Talk with your health care provider about whether it is safe for you to drink red wine in moderation. This means 1 glass a day for nonpregnant women and 2 glasses a day for men. A glass of wine equals 5 oz (150 mL). This information is not intended to replace advice given to you by  your health care provider. Make sure you discuss any questions you have with your health care provider. Document Released: 11/09/2015 Document Revised: 12/12/2015 Document Reviewed: 11/09/2015 Elsevier Interactive Patient Education  2017 [employer].

## 2022-05-07 NOTE — Progress Notes (Signed)
Assessment/Plan:   Memory Impairment  Shannon Mcintyre is a very pleasant 89 y.o. RH female with a history of hypertension, hyperlipidemia,  HOH, vocal cord atrophy seen today in follow up for memory loss and slight ventriculomegaly on brain MRI without NPH symptoms.  No NPH signs during exam today.  Last brain MRI showed generalized diffuse atrophy.  Patient is currently on donepezil 10 mg daily.  Today's MMSE is decreased to 21/30.  Given the slight decline, we discussed adding memantine, to which both her and her husband agree.    Follow up in 96months. Continue donepezil 10 mg daily, side effects discussed Start memantine 5 mg at night for 2 weeks, then increase to 5 mg twice daily if tolerated.  If memory worsens, we may consider increase to 10 mg twice daily Continue to monitor mood as per PCP Continue B12 supplement  Continue to monitor cardiovascular risk factors    Subjective:    This patient is accompanied in the office by her husband who supplements the history.  Previous records as well as any outside records available were reviewed prior to todays visit. Patient was last seen on 10/15/2021, at which time her MMSE was 25/30.   Any changes in memory since last visit? 'About the same", she tries to remain busy, she may forget recent conversation, but she is good with names. repeats oneself?  Endorsed Disoriented when walking into a room?  Patient denies   Leaving objects in unusual places?    denies   Wandering behavior?  denies   Any personality changes since last visit?  denies   Any worsening depression?:  denies   Hallucinations or paranoia?  denies   Seizures?    denies    Any sleep changes?  Sleeps well . Denies vivid dreams, REM behavior or sleepwalking   Sleep apnea?   denies   Any hygiene concerns?    denies   Independent of bathing and dressing?  She needs assistance with showering, husband assists her.  She does set 3 times a week on Monday Wednesday and  Thursdays. A caregiver comes to help as well 3 x a week . Does the patient needs help with medications?  Her husband helps placing the medications in the pillbox Who is in charge of the finances?  Husband is in charge    Any changes in appetite?  denies "too good"   Patient have trouble swallowing?  denies   Does the patient cook? Very little  Any kitchen accidents such as leaving the stove on? Patient denies   Any headaches?   denies   Chronic back pain  denies   Ambulates with difficulty?  She walks about 30 minutes a day, she admits that she needs to increase this activity.  She finished physical therapy, with good results. recent falls or head injuries? Mechanical fall 4-5 months ago, no LOC or head injury, fell on her behind.  Unilateral weakness, numbness or tingling?    denies   Any tremors?  denies   Any anosmia?  Patient denies   Any incontinence of urine?  Endorsed, she wears depends Any bowel dysfunction?   On episode of diarrhea Dec 2023 no recurrence  Patient lives   with her husband at [facility]. Does the patient drive?No longer drives   HISTORY OF PRESENT ILLNESS 06/23/20: This is an 89 year old right-handed woman with a history of hypertension, hyperlipidemia, vocal cord atrophy, presenting for evaluation of memory loss and slight ventriculomegaly on brain  MRI. Her husband is present and her son Shannon Mcintyre from [place] is on speakerphone to provide additional information. She feels her memory is "pretty good as far as I can tell." Her husband started noticing memory changes around a year ago, however Shannon Mcintyre started noticing minor changes 5 years ago where she would pick up the wrong purse or ask the same question. She was misplacing things and asking where to put things back. They would find objects where they don't belong. Cooking was becoming an issue, she would leave the stove on. She stated she manages her own medications, Shannon Mcintyre reports medications are put in her  pillbox and they give it to her AM and PM. Her husband manages finances. She stopped driving after her knee surgery, "they won't let me drive," denies getting lost. Family agrees her sense of direction is perfect but reaction times are slower. They moved to [facility] a year ago. A caregiver comes three times a week to help with showering. She is independent with dressing. There is no family history of dementia. No history of significant head injuries or alcohol use.   She denies any headaches, dizziness, diplopia, dysarthria, dysphagia, neck/back pain, focal numbness/tingling/weakness, anosmia, tremors. She has had problems with incontinence, there has been some improvement but "not fullproof," she still wears Depends and wakes up wet in the morning. They deny any falls, she states her walking could be better, she can't walk straight and sometimes has trouble walking. She did not want to do physical therapy after her 2 knee replacements and "walks like a cowboy." Sleep is good, no REM behavior disorder. Mood is pretty good, no paranoia or hallucinations.    I personally reviewed MRI brain with and without contrast done 03/2020 which did not show any acute changes. There was diffuse atrophy with ventricles slightly prominent relative to sulci, moderate chronic microvascular disease.   PREVIOUS MEDICATIONS:   CURRENT MEDICATIONS:  Outpatient Encounter Medications as of 05/07/2022  Medication Sig   alendronate (FOSAMAX) 70 MG tablet Take 70 mg by mouth once a week.   amLODipine (NORVASC) 2.5 MG tablet 1 tablet   Apremilast (OTEZLA PO) Take by mouth.   Biotin 1000 MCG tablet Take 1,000 mcg by mouth daily.    Cholecalciferol (VITAMIN D3) 50 MCG (2000 UT) TABS Take 2,000 Units by mouth daily.   Cyanocobalamin (VITAMIN B12) 1000 MCG TBCR 1 tablet   Folic Acid 5 MG CAPS Take 5 mg by mouth daily.    influenza vaccine adjuvanted (FLUAD QUADRIVALENT) 0.5 ML injection Inject into the muscle.   memantine  (NAMENDA) 5 MG tablet Take 1 tablet (5 mg at night) for 2 weeks, then increase to 1 tablet (5 mg) twice a day   [DISCONTINUED] donepezil (ARICEPT) 10 MG tablet TAKE ONE TABLET BY MOUTH ONCE DAILY   donepezil (ARICEPT) 10 MG tablet TAKE ONE TABLET BY MOUTH ONCE DAILY   No facility-administered encounter medications on file as of 05/07/2022.       05/07/2022   12:00 PM 10/16/2021   11:00 AM  MMSE - Mini Mental State Exam  Orientation to time 2 4  Orientation to Place 4 5  Registration 3 3  Attention/ Calculation 4 5  Recall 0 0  Language- name 2 objects 2 2  Language- repeat 1 1  Language- follow 3 step command 3 3  Language- read & follow direction 1 1  Write a sentence 1 1  Copy design 0 0  Total score 21 25  06/23/2020    2:00 PM  Montreal Cognitive Assessment   Visuospatial/ Executive (0/5) 0  Naming (0/3) 2  Attention: Read list of digits (0/2) 1  Attention: Read list of letters (0/1) 1  Attention: Serial 7 subtraction starting at 100 (0/3) 1  Language: Repeat phrase (0/2) 1  Language : Fluency (0/1) 0  Abstraction (0/2) 0  Delayed Recall (0/5) 0  Orientation (0/6) 5  Total 11  Adjusted Score (based on education) 11    Objective:     PHYSICAL EXAMINATION:    VITALS:   Vitals:   05/07/22 1057  BP: (!) 148/64  Pulse: 77  SpO2: 96%  Weight: 139 lb (63 kg)  Height: 5\' 6"  (1.676 m)    GEN:  The patient appears stated age and is in NAD. HEENT:  Normocephalic, atraumatic.   Neurological examination:  General: NAD, well-groomed, appears stated age. Orientation: The patient is alert. Oriented to person and to place, not to date Cranial nerves: There is good facial symmetry.The speech is fluent and clear. No aphasia or dysarthria. Fund of knowledge is appropriate. Recent and remote memory are impaired. Attention and concentration are reduced.  Able to name objects and repeat phrases.  Hearing is intact to conversational tone.    Sensation: Sensation is intact  to light touch throughout Motor: Strength is at least antigravity x4. DTR's 2/4 in UE/LE     Movement examination: Tone: There is normal tone in the UE/LE Abnormal movements:  no tremor.  No myoclonus.  No asterixis.   Coordination:  There is no decremation with RAM's. Normal finger to nose  Gait and Station: The patient has slight difficulty arising out of a deep-seated chair without the use of the hands.  She needs a cane to ambulate.  The patient's stride length is good.  Gait is cautious and wide-based, but no typical magnetic gait is noted  Thank you for allowing [place] the opportunity to participate in the care of this nice patient. Please do not hesitate to contact us for any questions or concerns.   Total time spent on today's visit was 30 minutes dedicated to this patient today, preparing to see patient, examining the patient, ordering tests and/or medications and counseling the patient, documenting clinical information in the EHR or other health record, independently interpreting results and communicating results to the patient/family, discussing treatment and goals, answering patient's questions and coordinating care.  Cc:  Kathalene Frames, MD  Sharene Butters 05/07/2022 12:31 PM

## 2022-05-15 DIAGNOSIS — Z23 Encounter for immunization: Secondary | ICD-10-CM | POA: Diagnosis not present

## 2022-05-15 DIAGNOSIS — R5383 Other fatigue: Secondary | ICD-10-CM | POA: Diagnosis not present

## 2022-05-15 DIAGNOSIS — R053 Chronic cough: Secondary | ICD-10-CM | POA: Diagnosis not present

## 2022-05-16 ENCOUNTER — Other Ambulatory Visit: Payer: Self-pay | Admitting: Internal Medicine

## 2022-05-16 ENCOUNTER — Ambulatory Visit
Admission: RE | Admit: 2022-05-16 | Discharge: 2022-05-16 | Disposition: A | Discharge status: Home / Self Care | Payer: Medicare Other | Source: Ambulatory Visit | Attending: Internal Medicine | Admitting: Internal Medicine

## 2022-05-16 DIAGNOSIS — R053 Chronic cough: Secondary | ICD-10-CM | POA: Diagnosis not present

## 2022-07-01 DIAGNOSIS — Z79899 Other long term (current) drug therapy: Secondary | ICD-10-CM | POA: Diagnosis not present

## 2022-07-01 DIAGNOSIS — L400 Psoriasis vulgaris: Secondary | ICD-10-CM | POA: Diagnosis not present

## 2022-09-03 DIAGNOSIS — H903 Sensorineural hearing loss, bilateral: Secondary | ICD-10-CM | POA: Diagnosis not present

## 2022-09-10 DIAGNOSIS — M810 Age-related osteoporosis without current pathological fracture: Secondary | ICD-10-CM | POA: Diagnosis not present

## 2022-09-10 DIAGNOSIS — I129 Hypertensive chronic kidney disease with stage 1 through stage 4 chronic kidney disease, or unspecified chronic kidney disease: Secondary | ICD-10-CM | POA: Diagnosis not present

## 2022-09-10 DIAGNOSIS — R053 Chronic cough: Secondary | ICD-10-CM | POA: Diagnosis not present

## 2022-09-10 DIAGNOSIS — N3281 Overactive bladder: Secondary | ICD-10-CM | POA: Diagnosis not present

## 2022-09-10 DIAGNOSIS — I700 Atherosclerosis of aorta: Secondary | ICD-10-CM | POA: Diagnosis not present

## 2022-09-10 DIAGNOSIS — N1831 Chronic kidney disease, stage 3a: Secondary | ICD-10-CM | POA: Diagnosis not present

## 2022-11-05 ENCOUNTER — Ambulatory Visit: Payer: Medicare Other | Admitting: Physician Assistant

## 2022-11-05 DIAGNOSIS — Z79899 Other long term (current) drug therapy: Secondary | ICD-10-CM | POA: Diagnosis not present

## 2022-11-05 DIAGNOSIS — L400 Psoriasis vulgaris: Secondary | ICD-10-CM | POA: Diagnosis not present

## 2022-11-11 ENCOUNTER — Encounter: Payer: Self-pay | Admitting: Physician Assistant

## 2022-11-11 ENCOUNTER — Ambulatory Visit (INDEPENDENT_AMBULATORY_CARE_PROVIDER_SITE_OTHER): Payer: Medicare Other | Admitting: Physician Assistant

## 2022-11-11 VITALS — BP 149/69 | HR 68 | Resp 20 | Ht 66.0 in | Wt 148.0 lb

## 2022-11-11 DIAGNOSIS — F0280 Dementia in other diseases classified elsewhere without behavioral disturbance: Secondary | ICD-10-CM | POA: Diagnosis not present

## 2022-11-11 DIAGNOSIS — G309 Alzheimer's disease, unspecified: Secondary | ICD-10-CM

## 2022-11-11 MED ORDER — MEMANTINE HCL 5 MG PO TABS: ORAL_TABLET | ORAL | 3 refills | Status: DC

## 2022-11-11 MED ORDER — DONEPEZIL HCL 10 MG PO TABS: ORAL_TABLET | ORAL | 3 refills | Status: DC

## 2022-11-11 NOTE — Progress Notes (Signed)
Assessment/Plan:   Dementia likely due to Alzheimer's disease  Shannon Mcintyre is a very pleasant 89 y.o. RH female with a history of hypertension, hyperlipidemia, psoriasis, HOH, vocal cord atrophy, slight ventriculomegaly per MRI of the brain without NPH symptoms seen today in follow up for memory loss. Patient is currently on donepezil 10 mg daily and memantine 5 mg twice daily (the patient felt that he was supposed to be daily instead of twice daily).  Her memory remains stable, with an MMSE of 27/30.  She is able to participate in her ADLs without difficulty.  She no longer drives.    Follow up in  6 months. Donepezil 10 mg daily and memantine 5 mg twice daily, side effects discussed  continue B12 supplement Recommend good control of her cardiovascular risk factors Continue to control mood as per PCP   Subjective:    This patient is accompanied in the office by her husband who supplements the history.  Previous records as well as any outside records available were reviewed prior to todays visit. Patient was last seen on 05/07/2022 with MMSE 21/30   Any changes in memory since last visit? "About the same".  She tries to remain busy.  She may forget recent information such as conversations, "but she is good with names ". repeats oneself?  Endorsed Disoriented when walking into a room?  Patient denies   Leaving objects in unusual places?  May misplace things but not in unusual places  Wandering behavior?  denies   Any personality changes since last visit?  denies   Any worsening depression?:  Denies.   Hallucinations or paranoia?  Denies.   Seizures? denies    Any sleep changes?  Sleeps well.  Denies vivid dreams, REM behavior or sleepwalking   Sleep apnea?   Denies.   Any hygiene concerns? Denies.  Independent of bathing and dressing?  She needs assistance with showering, her husband assists her.  A caregiver comes to help 3 times a week as well. Does the patient needs help  with medications?  Her husband helps by placing the medications in the pillbox  Who is in charge of the finances?   Husband is in charge     Any changes in appetite?  "She is always hungry".      Patient have trouble swallowing? Denies.   Does the patient cook? No Any headaches?   denies   Chronic back pain  denies   Ambulates with difficulty? Denies.  She walks frequently in the long hallways   Recent falls or head injuries? denies     Unilateral weakness, numbness or tingling? denies   Any tremors?  Denies   Any anosmia?  Denies   Any incontinence of urine?  Endorsed, she wears Depends Any bowel dysfunction?   Denies      Patient lives with her husband Shannon Mcintyre  Does the patient drive? No longer drives      HISTORY OF PRESENT ILLNESS 06/23/20: This is an 89 year old right-handed woman with a history of hypertension, hyperlipidemia, vocal cord atrophy, presenting for evaluation of memory loss and slight ventriculomegaly on brain MRI. Her husband is present and her son Shannon Mcintyre from [place] is on speakerphone to provide additional information. She feels her memory is "pretty good as far as I can tell." Her husband started noticing memory changes around a year ago, however Shannon Mcintyre started noticing minor changes 5 years ago where she would pick up the wrong purse or ask the  same question. She was misplacing things and asking where to put things back. They would find objects where they don't belong. Cooking was becoming an issue, she would leave the stove on. She stated she manages her own medications, Shannon Mcintyre reports medications are put in her pillbox and they give it to her AM and PM. Her husband manages finances. She stopped driving after her knee surgery, "they won't let me drive," denies getting lost. Family agrees her sense of direction is perfect but reaction times are slower. They moved to [facility] a year ago. A caregiver comes three times a week to help with showering. She is  independent with dressing. There is no family history of dementia. No history of significant head injuries or alcohol use.   She denies any headaches, dizziness, diplopia, dysarthria, dysphagia, neck/back pain, focal numbness/tingling/weakness, anosmia, tremors. She has had problems with incontinence, there has been some improvement but "not fullproof," she still wears Depends and wakes up wet in the morning. They deny any falls, she states her walking could be better, she can't walk straight and sometimes has trouble walking. She did not want to do physical therapy after her 2 knee replacements and "walks like a cowboy." Sleep is good, no REM behavior disorder. Mood is pretty good, no paranoia or hallucinations.    I personally reviewed MRI brain with and without contrast done 03/2020 which did not show any acute changes. There was diffuse atrophy with ventricles slightly prominent relative to sulci, moderate chronic microvascular disease.   PREVIOUS MEDICATIONS:   CURRENT MEDICATIONS:  Outpatient Encounter Medications as of 11/11/2022  Medication Sig   alendronate (FOSAMAX) 70 MG tablet Take 70 mg by mouth once a week.   amLODipine (NORVASC) 2.5 MG tablet 1 tablet   Apremilast (OTEZLA PO) Take by mouth.   Biotin 1000 MCG tablet Take 1,000 mcg by mouth daily.    Cholecalciferol (VITAMIN D3) 50 MCG (2000 UT) TABS Take 2,000 Units by mouth daily.   Cyanocobalamin (VITAMIN B12) 1000 MCG TBCR 1 tablet   donepezil (ARICEPT) 10 MG tablet TAKE ONE TABLET BY MOUTH ONCE DAILY   Folic Acid 5 MG CAPS Take 5 mg by mouth daily.    influenza vaccine adjuvanted (FLUAD QUADRIVALENT) 0.5 ML injection Inject into the muscle.   [DISCONTINUED] donepezil (ARICEPT) 10 MG tablet TAKE ONE TABLET BY MOUTH ONCE DAILY   [DISCONTINUED] memantine (NAMENDA) 5 MG tablet Take 1 tablet (5 mg at night) for 2 weeks, then increase to 1 tablet (5 mg) twice a day   Cholecalciferol (VITAMIN D3) 50 MCG (2000 UT) capsule 1 capsule  Orally Once a day   memantine (NAMENDA) 5 MG tablet Take 1 tablet twice a day   No facility-administered encounter medications on file as of 11/11/2022.       11/11/2022   12:00 PM 05/07/2022   12:00 PM 10/16/2021   11:00 AM  MMSE - Mini Mental State Exam  Orientation to time 5 2 4   Orientation to Place 4 4 5   Registration 3 3 3   Attention/ Calculation 5 4 5   Recall 1 0 0  Language- name 2 objects 2 2 2   Language- repeat 1 1 1   Language- follow 3 step command 3 3 3   Language- read & follow direction 1 1 1   Write a sentence 1 1 1   Copy design 1 0 0  Total score 27 21 25       06/23/2020    2:00 PM  Montreal Cognitive Assessment   Visuospatial/  Executive (0/5) 0  Naming (0/3) 2  Attention: Read list of digits (0/2) 1  Attention: Read list of letters (0/1) 1  Attention: Serial 7 subtraction starting at 100 (0/3) 1  Language: Repeat phrase (0/2) 1  Language : Fluency (0/1) 0  Abstraction (0/2) 0  Delayed Recall (0/5) 0  Orientation (0/6) 5  Total 11  Adjusted Score (based on education) 11    Objective:     PHYSICAL EXAMINATION:    VITALS:   Vitals:   11/11/22 1102 11/11/22 1136  BP: (!) 148/65 (!) 149/69  Pulse: 68   Resp: 20   SpO2: 95%   Weight: 148 lb (67.1 kg)   Height: 5\' 6"  (1.676 m)     GEN:  The patient appears stated age and is in NAD. HEENT:  Normocephalic, atraumatic.   Neurological examination:  General: NAD, well-groomed, appears stated age. Orientation: The patient is alert. Oriented to person, place and date Cranial nerves: There is good facial symmetry.The speech is fluent and clear. No aphasia or dysarthria. Fund of knowledge is appropriate. Recent and remote memory are impaired. Attention and concentration are normal.  Able to name objects and repeat phrases.  Hearing is mildly decreased to conversational tone.   Sensation: Sensation is intact to light touch throughout Motor: Strength is at least antigravity x4. DTR's 2/4 in UE/LE      Movement examination: Tone: There is normal tone in the UE/LE Abnormal movements:  no tremor.  No myoclonus.  No asterixis.   Coordination:  There is no decremation with RAM's. Normal finger to nose  Gait and Station: The patient has some difficulty arising out of a deep-seated chair without the use of the hands, needs a cane to ambulate. The patient's stride length is good.  Gait is cautious and wide-based, no magnetic step is noted.   Thank you for allowing [place] the opportunity to participate in the care of this nice patient. Please do not hesitate to contact us for any questions or concerns.   Total time spent on today's visit was 26 minutes dedicated to this patient today, preparing to see patient, examining the patient, ordering tests and/or medications and counseling the patient, documenting clinical information in the EHR or other health record, independently interpreting results and communicating results to the patient/family, discussing treatment and goals, answering patient's questions and coordinating care.  Cc:  Emilio Aspen, MD  Marlowe Kays 11/11/2022 12:13 PM

## 2022-11-11 NOTE — Patient Instructions (Signed)
It was a pleasure to see you today at our office.   Recommendations:  Follow up in  6 months Continue donepezil 10 mg daily. Side effects were discussed  Continue memantine 5 mg 1 tablet twice daily.   S    Whom to call:  Memory  decline, memory medications: Call our office [phone]   For psychiatric meds, mood meds: Please have your primary care physician manage these medications.   Counseling re  For assessment of decision of mental capacity and competency:  Call Dr. Erick Blinks, geriatric psychiatrist at [phone]  For guidance in geriatric dementia issues please call [facility] [phone]     If you have any severe symptoms of a stroke, or other severe issues such as confusion,severe chills or fever, etc call 911 or go to the ER as you may need to be evaluated further   Feel free to visit Facebook page " [email or web address]" for tips of how to care for people with memory problems.      RECOMMENDATIONS FOR ALL PATIENTS WITH MEMORY PROBLEMS: 1. Continue to exercise (Recommend 30 minutes of walking everyday, or 3 hours every week) 2. Increase social interactions - continue going to [place] and enjoy social gatherings with friends and family 3. Eat healthy, avoid fried foods and eat more fruits and vegetables 4. Maintain adequate blood pressure, blood sugar, and blood cholesterol level. Reducing the risk of stroke and cardiovascular disease also helps promoting better memory. 5. Avoid stressful situations. Live a simple life and avoid aggravations. Organize your time and prepare for the next day in anticipation. 6. Sleep well, avoid any interruptions of sleep and avoid any distractions in the bedroom that may interfere with adequate sleep quality 7. Avoid sugar, avoid sweets as there is a strong link between excessive sugar intake, diabetes, and cognitive impairment We discussed the Mediterranean diet, which has been shown to help patients reduce the risk of progressive  memory disorders and reduces cardiovascular risk. This includes eating fish, eat fruits and green leafy vegetables, nuts like almonds and hazelnuts, walnuts, and also use olive oil. Avoid fast foods and fried foods as much as possible. Avoid sweets and sugar as sugar use has been linked to worsening of memory function.  There is always a concern of gradual progression of memory problems. If this is the case, then we may need to adjust level of care according to patient needs. Support, both to the patient and caregiver, should then be put into place.    The Alzheimer's Association is here all day, every day for people facing Alzheimer's disease through our free 24/7 Helpline: 415-629-9360. The Helpline provides reliable information and support to all those who need assistance, such as individuals living with memory loss, Alzheimer's or other dementia, caregivers, health care professionals and the public.  Our highly trained and knowledgeable staff can help you with: Understanding memory loss, dementia and Alzheimer's  Medications and other treatment options  General information about aging and brain health  Skills to provide quality care and to find the best care from professionals  Legal, financial and living-arrangement decisions Our Helpline also features: Confidential care consultation provided by master's level clinicians who can help with decision-making support, crisis assistance and education on issues families face every day  Help in a caller's preferred language using our translation service that features more than 200 languages and dialects  Referrals to local community programs, services and ongoing support     FALL PRECAUTIONS: Be cautious when walking. Scan  the area for obstacles that may increase the risk of trips and falls. When getting up in the mornings, sit up at the edge of the bed for a few minutes before getting out of bed. Consider elevating the bed at the head end to avoid  drop of blood pressure when getting up. Walk always in a well-lit room (use night lights in the walls). Avoid area rugs or power cords from appliances in the middle of the walkways. Use a walker or a cane if necessary and consider physical therapy for balance exercise. Get your eyesight checked regularly.  FINANCIAL OVERSIGHT: Supervision, especially oversight when making financial decisions or transactions is also recommended.  HOME SAFETY: Consider the safety of the kitchen when operating appliances like stoves, microwave oven, and blender. Consider having supervision and share cooking responsibilities until no longer able to participate in those. Accidents with firearms and other hazards in the house should be identified and addressed as well.   ABILITY TO BE LEFT ALONE: If patient is unable to contact 911 operator, consider using LifeLine, or when the need is there, arrange for someone to stay with patients. Smoking is a fire hazard, consider supervision or cessation. Risk of wandering should be assessed by caregiver and if detected at any point, supervision and safe proof recommendations should be instituted.  MEDICATION SUPERVISION: Inability to self-administer medication needs to be constantly addressed. Implement a mechanism to ensure safe administration of the medications.   DRIVING: Regarding driving, in patients with progressive memory problems, driving will be impaired. We advise to have someone else do the driving if trouble finding directions or if minor accidents are reported. Independent driving assessment is available to determine safety of driving.   If you are interested in the driving assessment, you can contact the following:  [facility] [employer] [facility] in [place] [phone]  [facility] [phone]  [facility] [phone] or [phone]      Mediterranean Diet A Mediterranean diet refers to food and  lifestyle choices that are based on the traditions of countries located on the [place]. This way of eating has been shown to help prevent certain conditions and improve outcomes for people who have chronic diseases, like kidney disease and heart disease. What are tips for following this plan? Lifestyle  Cook and eat meals together with your family, when possible. Drink enough fluid to keep your urine clear or pale yellow. Be physically active every day. This includes: Aerobic exercise like running or swimming. Leisure activities like gardening, walking, or housework. Get 7-8 hours of sleep each night. If recommended by your health care provider, drink red wine in moderation. This means 1 glass a day for nonpregnant women and 2 glasses a day for men. A glass of wine equals 5 oz (150 mL). Reading food labels  Check the serving size of packaged foods. For foods such as rice and pasta, the serving size refers to the amount of cooked product, not dry. Check the total fat in packaged foods. Avoid foods that have saturated fat or trans fats. Check the ingredients list for added sugars, such as corn syrup. Shopping  At the grocery store, buy most of your food from the areas near the walls of the store. This includes: Fresh fruits and vegetables (produce). Grains, beans, nuts, and seeds. Some of these may be available in unpackaged forms or large amounts (in bulk). Fresh seafood. Poultry and eggs. Low-fat dairy products. Buy whole ingredients instead of prepackaged foods.  Buy fresh fruits and vegetables in-season from local farmers markets. Buy frozen fruits and vegetables in resealable bags. If you do not have access to quality fresh seafood, buy precooked frozen shrimp or canned fish, such as tuna, salmon, or sardines. Buy small amounts of raw or cooked vegetables, salads, or olives from the deli or salad bar at your store. Stock your pantry so you always have certain foods on hand, such  as olive oil, canned tuna, canned tomatoes, rice, pasta, and beans. Cooking  Cook foods with extra-virgin olive oil instead of using butter or other vegetable oils. Have meat as a side dish, and have vegetables or grains as your main dish. This means having meat in small portions or adding small amounts of meat to foods like pasta or stew. Use beans or vegetables instead of meat in common dishes like chili or lasagna. Experiment with different cooking methods. Try roasting or broiling vegetables instead of steaming or sauteing them. Add frozen vegetables to soups, stews, pasta, or rice. Add nuts or seeds for added healthy fat at each meal. You can add these to yogurt, salads, or vegetable dishes. Marinate fish or vegetables using olive oil, lemon juice, garlic, and fresh herbs. Meal planning  Plan to eat 1 vegetarian meal one day each week. Try to work up to 2 vegetarian meals, if possible. Eat seafood 2 or more times a week. Have healthy snacks readily available, such as: Vegetable sticks with hummus. Greek yogurt. Fruit and nut trail mix. Eat balanced meals throughout the week. This includes: Fruit: 2-3 servings a day Vegetables: 4-5 servings a day Low-fat dairy: 2 servings a day Fish, poultry, or lean meat: 1 serving a day Beans and legumes: 2 or more servings a week Nuts and seeds: 1-2 servings a day Whole grains: 6-8 servings a day Extra-virgin olive oil: 3-4 servings a day Limit red meat and sweets to only a few servings a month What are my food choices? Mediterranean diet Recommended Grains: Whole-grain pasta. Brown rice. Bulgar wheat. Polenta. Couscous. Whole-wheat bread. Orpah Cobb. Vegetables: Artichokes. Beets. Broccoli. Cabbage. Carrots. Eggplant. Green beans. Chard. Kale. Spinach. Onions. Leeks. Peas. Squash. Tomatoes. Peppers. Radishes. Fruits: Apples. Apricots. Avocado. Berries. Bananas. Cherries. Dates. Figs. Grapes. Lemons. Melon. Oranges. Peaches. Plums.  Pomegranate. Meats and other protein foods: Beans. Almonds. Sunflower seeds. Pine nuts. Peanuts. Cod. Salmon. Scallops. Shrimp. Tuna. Tilapia. Clams. Oysters. Eggs. Dairy: Low-fat milk. Cheese. Greek yogurt. Beverages: Water. Red wine. Herbal tea. Fats and oils: Extra virgin olive oil. Avocado oil. Grape seed oil. Sweets and desserts: [place] yogurt with honey. Baked apples. Poached pears. Trail mix. Seasoning and other foods: Basil. Cilantro. Coriander. Cumin. Mint. Parsley. Sage. Rosemary. Tarragon. Garlic. Oregano. Thyme. Pepper. Balsalmic vinegar. Tahini. Hummus. Tomato sauce. Olives. Mushrooms. Limit these Grains: Prepackaged pasta or rice dishes. Prepackaged cereal with added sugar. Vegetables: Deep fried potatoes (french fries). Fruits: Fruit canned in syrup. Meats and other protein foods: Beef. Pork. Lamb. Poultry with skin. Hot dogs. Tomasa Blase. Dairy: Ice cream. Sour cream. Whole milk. Beverages: Juice. Sugar-sweetened soft drinks. Beer. Liquor and spirits. Fats and oils: Butter. Canola oil. Vegetable oil. Beef fat (tallow). Lard. Sweets and desserts: Cookies. Cakes. Pies. Candy. Seasoning and other foods: Mayonnaise. Premade sauces and marinades. The items listed may not be a complete list. Talk with your dietitian about what dietary choices are right for you. Summary The Mediterranean diet includes both food and lifestyle choices. Eat a variety of fresh fruits and vegetables, beans, nuts, seeds, and whole grains. Limit the  amount of red meat and sweets that you eat. Talk with your health care provider about whether it is safe for you to drink red wine in moderation. This means 1 glass a day for nonpregnant women and 2 glasses a day for men. A glass of wine equals 5 oz (150 mL). This information is not intended to replace advice given to you by your health care provider. Make sure you discuss any questions you have with your health care provider. Document Released: 11/09/2015 Document  Revised: 12/12/2015 Document Reviewed: 11/09/2015 Elsevier Interactive Patient Education  2017 [employer].

## 2023-01-02 DIAGNOSIS — Z23 Encounter for immunization: Secondary | ICD-10-CM | POA: Diagnosis not present

## 2023-03-11 DIAGNOSIS — L400 Psoriasis vulgaris: Secondary | ICD-10-CM | POA: Diagnosis not present

## 2023-03-11 DIAGNOSIS — Z79899 Other long term (current) drug therapy: Secondary | ICD-10-CM | POA: Diagnosis not present

## 2023-03-13 DIAGNOSIS — G301 Alzheimer's disease with late onset: Secondary | ICD-10-CM | POA: Diagnosis not present

## 2023-03-13 DIAGNOSIS — N3281 Overactive bladder: Secondary | ICD-10-CM | POA: Diagnosis not present

## 2023-03-13 DIAGNOSIS — K219 Gastro-esophageal reflux disease without esophagitis: Secondary | ICD-10-CM | POA: Diagnosis not present

## 2023-03-13 DIAGNOSIS — Z79899 Other long term (current) drug therapy: Secondary | ICD-10-CM | POA: Diagnosis not present

## 2023-03-13 DIAGNOSIS — M810 Age-related osteoporosis without current pathological fracture: Secondary | ICD-10-CM | POA: Diagnosis not present

## 2023-03-13 DIAGNOSIS — R2689 Other abnormalities of gait and mobility: Secondary | ICD-10-CM | POA: Diagnosis not present

## 2023-03-13 DIAGNOSIS — I129 Hypertensive chronic kidney disease with stage 1 through stage 4 chronic kidney disease, or unspecified chronic kidney disease: Secondary | ICD-10-CM | POA: Diagnosis not present

## 2023-03-13 DIAGNOSIS — Z0000 Encounter for general adult medical examination without abnormal findings: Secondary | ICD-10-CM | POA: Diagnosis not present

## 2023-03-13 DIAGNOSIS — R413 Other amnesia: Secondary | ICD-10-CM | POA: Diagnosis not present

## 2023-03-13 DIAGNOSIS — N1831 Chronic kidney disease, stage 3a: Secondary | ICD-10-CM | POA: Diagnosis not present

## 2023-03-13 DIAGNOSIS — I700 Atherosclerosis of aorta: Secondary | ICD-10-CM | POA: Diagnosis not present

## 2023-03-13 DIAGNOSIS — F02B0 Dementia in other diseases classified elsewhere, moderate, without behavioral disturbance, psychotic disturbance, mood disturbance, and anxiety: Secondary | ICD-10-CM | POA: Diagnosis not present

## 2023-04-24 DIAGNOSIS — Z8262 Family history of osteoporosis: Secondary | ICD-10-CM | POA: Diagnosis not present

## 2023-04-24 DIAGNOSIS — M8588 Other specified disorders of bone density and structure, other site: Secondary | ICD-10-CM | POA: Diagnosis not present

## 2023-04-25 DIAGNOSIS — R2681 Unsteadiness on feet: Secondary | ICD-10-CM | POA: Diagnosis not present

## 2023-04-25 DIAGNOSIS — M62561 Muscle wasting and atrophy, not elsewhere classified, right lower leg: Secondary | ICD-10-CM | POA: Diagnosis not present

## 2023-04-25 DIAGNOSIS — R2689 Other abnormalities of gait and mobility: Secondary | ICD-10-CM | POA: Diagnosis not present

## 2023-04-25 DIAGNOSIS — M62562 Muscle wasting and atrophy, not elsewhere classified, left lower leg: Secondary | ICD-10-CM | POA: Diagnosis not present

## 2023-05-01 DIAGNOSIS — M62561 Muscle wasting and atrophy, not elsewhere classified, right lower leg: Secondary | ICD-10-CM | POA: Diagnosis not present

## 2023-05-01 DIAGNOSIS — R2681 Unsteadiness on feet: Secondary | ICD-10-CM | POA: Diagnosis not present

## 2023-05-01 DIAGNOSIS — R2689 Other abnormalities of gait and mobility: Secondary | ICD-10-CM | POA: Diagnosis not present

## 2023-05-01 DIAGNOSIS — M62562 Muscle wasting and atrophy, not elsewhere classified, left lower leg: Secondary | ICD-10-CM | POA: Diagnosis not present

## 2023-05-02 DIAGNOSIS — R2689 Other abnormalities of gait and mobility: Secondary | ICD-10-CM | POA: Diagnosis not present

## 2023-05-02 DIAGNOSIS — M62562 Muscle wasting and atrophy, not elsewhere classified, left lower leg: Secondary | ICD-10-CM | POA: Diagnosis not present

## 2023-05-02 DIAGNOSIS — M62561 Muscle wasting and atrophy, not elsewhere classified, right lower leg: Secondary | ICD-10-CM | POA: Diagnosis not present

## 2023-05-02 DIAGNOSIS — R2681 Unsteadiness on feet: Secondary | ICD-10-CM | POA: Diagnosis not present

## 2023-05-05 DIAGNOSIS — R2681 Unsteadiness on feet: Secondary | ICD-10-CM | POA: Diagnosis not present

## 2023-05-05 DIAGNOSIS — M62561 Muscle wasting and atrophy, not elsewhere classified, right lower leg: Secondary | ICD-10-CM | POA: Diagnosis not present

## 2023-05-05 DIAGNOSIS — M62562 Muscle wasting and atrophy, not elsewhere classified, left lower leg: Secondary | ICD-10-CM | POA: Diagnosis not present

## 2023-05-05 DIAGNOSIS — R2689 Other abnormalities of gait and mobility: Secondary | ICD-10-CM | POA: Diagnosis not present

## 2023-05-08 DIAGNOSIS — M62561 Muscle wasting and atrophy, not elsewhere classified, right lower leg: Secondary | ICD-10-CM | POA: Diagnosis not present

## 2023-05-08 DIAGNOSIS — R2681 Unsteadiness on feet: Secondary | ICD-10-CM | POA: Diagnosis not present

## 2023-05-08 DIAGNOSIS — R2689 Other abnormalities of gait and mobility: Secondary | ICD-10-CM | POA: Diagnosis not present

## 2023-05-08 DIAGNOSIS — M62562 Muscle wasting and atrophy, not elsewhere classified, left lower leg: Secondary | ICD-10-CM | POA: Diagnosis not present

## 2023-05-12 DIAGNOSIS — M62562 Muscle wasting and atrophy, not elsewhere classified, left lower leg: Secondary | ICD-10-CM | POA: Diagnosis not present

## 2023-05-12 DIAGNOSIS — R2689 Other abnormalities of gait and mobility: Secondary | ICD-10-CM | POA: Diagnosis not present

## 2023-05-12 DIAGNOSIS — R2681 Unsteadiness on feet: Secondary | ICD-10-CM | POA: Diagnosis not present

## 2023-05-12 DIAGNOSIS — M62561 Muscle wasting and atrophy, not elsewhere classified, right lower leg: Secondary | ICD-10-CM | POA: Diagnosis not present

## 2023-05-13 DIAGNOSIS — M62562 Muscle wasting and atrophy, not elsewhere classified, left lower leg: Secondary | ICD-10-CM | POA: Diagnosis not present

## 2023-05-13 DIAGNOSIS — M62561 Muscle wasting and atrophy, not elsewhere classified, right lower leg: Secondary | ICD-10-CM | POA: Diagnosis not present

## 2023-05-13 DIAGNOSIS — R2681 Unsteadiness on feet: Secondary | ICD-10-CM | POA: Diagnosis not present

## 2023-05-13 DIAGNOSIS — R2689 Other abnormalities of gait and mobility: Secondary | ICD-10-CM | POA: Diagnosis not present

## 2023-05-14 ENCOUNTER — Encounter: Payer: Self-pay | Admitting: Physician Assistant

## 2023-05-14 ENCOUNTER — Ambulatory Visit: Payer: Medicare Other | Admitting: Physician Assistant

## 2023-05-14 ENCOUNTER — Ambulatory Visit (INDEPENDENT_AMBULATORY_CARE_PROVIDER_SITE_OTHER): Payer: Medicare Other | Admitting: Physician Assistant

## 2023-05-14 VITALS — BP 165/61 | HR 88 | Resp 18 | Wt 143.0 lb

## 2023-05-14 DIAGNOSIS — G309 Alzheimer's disease, unspecified: Secondary | ICD-10-CM

## 2023-05-14 DIAGNOSIS — F0280 Dementia in other diseases classified elsewhere without behavioral disturbance: Secondary | ICD-10-CM

## 2023-05-14 MED ORDER — MEMANTINE HCL 5 MG PO TABS: ORAL_TABLET | ORAL | 3 refills | Status: DC

## 2023-05-14 MED ORDER — DONEPEZIL HCL 10 MG PO TABS: ORAL_TABLET | ORAL | 3 refills | Status: AC

## 2023-05-14 NOTE — Patient Instructions (Signed)
It was a pleasure to see you today at our office.   Recommendations:  Follow up in  6 months Continue donepezil 10 mg daily. Side effects were discussed  Continue memantine 5 mg twice a day    Whom to call:  Memory  decline, memory medications: Call our office [phone]   For psychiatric meds, mood meds: Please have your primary care physician manage these medications.   Counseling re  For assessment of decision of mental capacity and competency:  Call Dr. Erick Blinks, geriatric psychiatrist at [phone]  For guidance in geriatric dementia issues please call [facility] [phone]     If you have any severe symptoms of a stroke, or other severe issues such as confusion,severe chills or fever, etc call 911 or go to the ER as you may need to be evaluated further   Feel free to visit Facebook page " [email or web address]" for tips of how to care for people with memory problems.      RECOMMENDATIONS FOR ALL PATIENTS WITH MEMORY PROBLEMS: 1. Continue to exercise (Recommend 30 minutes of walking everyday, or 3 hours every week) 2. Increase social interactions - continue going to [place] and enjoy social gatherings with friends and family 3. Eat healthy, avoid fried foods and eat more fruits and vegetables 4. Maintain adequate blood pressure, blood sugar, and blood cholesterol level. Reducing the risk of stroke and cardiovascular disease also helps promoting better memory. 5. Avoid stressful situations. Live a simple life and avoid aggravations. Organize your time and prepare for the next day in anticipation. 6. Sleep well, avoid any interruptions of sleep and avoid any distractions in the bedroom that may interfere with adequate sleep quality 7. Avoid sugar, avoid sweets as there is a strong link between excessive sugar intake, diabetes, and cognitive impairment We discussed the Mediterranean diet, which has been shown to help patients reduce the risk of progressive memory  disorders and reduces cardiovascular risk. This includes eating fish, eat fruits and green leafy vegetables, nuts like almonds and hazelnuts, walnuts, and also use olive oil. Avoid fast foods and fried foods as much as possible. Avoid sweets and sugar as sugar use has been linked to worsening of memory function.  There is always a concern of gradual progression of memory problems. If this is the case, then we may need to adjust level of care according to patient needs. Support, both to the patient and caregiver, should then be put into place.    The Alzheimer's Association is here all day, every day for people facing Alzheimer's disease through our free 24/7 Helpline: 226-227-5156. The Helpline provides reliable information and support to all those who need assistance, such as individuals living with memory loss, Alzheimer's or other dementia, caregivers, health care professionals and the public.  Our highly trained and knowledgeable staff can help you with: Understanding memory loss, dementia and Alzheimer's  Medications and other treatment options  General information about aging and brain health  Skills to provide quality care and to find the best care from professionals  Legal, financial and living-arrangement decisions Our Helpline also features: Confidential care consultation provided by master's level clinicians who can help with decision-making support, crisis assistance and education on issues families face every day  Help in a caller's preferred language using our translation service that features more than 200 languages and dialects  Referrals to local community programs, services and ongoing support     FALL PRECAUTIONS: Be cautious when walking. Scan the area for obstacles  that may increase the risk of trips and falls. When getting up in the mornings, sit up at the edge of the bed for a few minutes before getting out of bed. Consider elevating the bed at the head end to avoid drop of  blood pressure when getting up. Walk always in a well-lit room (use night lights in the walls). Avoid area rugs or power cords from appliances in the middle of the walkways. Use a walker or a cane if necessary and consider physical therapy for balance exercise. Get your eyesight checked regularly.  FINANCIAL OVERSIGHT: Supervision, especially oversight when making financial decisions or transactions is also recommended.  HOME SAFETY: Consider the safety of the kitchen when operating appliances like stoves, microwave oven, and blender. Consider having supervision and share cooking responsibilities until no longer able to participate in those. Accidents with firearms and other hazards in the house should be identified and addressed as well.   ABILITY TO BE LEFT ALONE: If patient is unable to contact 911 operator, consider using LifeLine, or when the need is there, arrange for someone to stay with patients. Smoking is a fire hazard, consider supervision or cessation. Risk of wandering should be assessed by caregiver and if detected at any point, supervision and safe proof recommendations should be instituted.  MEDICATION SUPERVISION: Inability to self-administer medication needs to be constantly addressed. Implement a mechanism to ensure safe administration of the medications.   DRIVING: Regarding driving, in patients with progressive memory problems, driving will be impaired. We advise to have someone else do the driving if trouble finding directions or if minor accidents are reported. Independent driving assessment is available to determine safety of driving.   If you are interested in the driving assessment, you can contact the following:  [facility] [employer] in [place] [phone]  [facility] [phone]  [facility] [phone] or [phone]      Mediterranean Diet A Mediterranean diet refers to food and  lifestyle choices that are based on the traditions of countries located on the [place]. This way of eating has been shown to help prevent certain conditions and improve outcomes for people who have chronic diseases, like kidney disease and heart disease. What are tips for following this plan? Lifestyle  Cook and eat meals together with your family, when possible. Drink enough fluid to keep your urine clear or pale yellow. Be physically active every day. This includes: Aerobic exercise like running or swimming. Leisure activities like gardening, walking, or housework. Get 7-8 hours of sleep each night. If recommended by your health care provider, drink red wine in moderation. This means 1 glass a day for nonpregnant women and 2 glasses a day for men. A glass of wine equals 5 oz (150 mL). Reading food labels  Check the serving size of packaged foods. For foods such as rice and pasta, the serving size refers to the amount of cooked product, not dry. Check the total fat in packaged foods. Avoid foods that have saturated fat or trans fats. Check the ingredients list for added sugars, such as corn syrup. Shopping  At the grocery store, buy most of your food from the areas near the walls of the store. This includes: Fresh fruits and vegetables (produce). Grains, beans, nuts, and seeds. Some of these may be available in unpackaged forms or large amounts (in bulk). Fresh seafood. Poultry and eggs. Low-fat dairy products. Buy whole ingredients instead of prepackaged foods. Buy fresh fruits and  vegetables in-season from local farmers markets. Buy frozen fruits and vegetables in resealable bags. If you do not have access to quality fresh seafood, buy precooked frozen shrimp or canned fish, such as tuna, salmon, or sardines. Buy small amounts of raw or cooked vegetables, salads, or olives from the deli or salad bar at your store. Stock your pantry so you always have certain foods on hand, such  as olive oil, canned tuna, canned tomatoes, rice, pasta, and beans. Cooking  Cook foods with extra-virgin olive oil instead of using butter or other vegetable oils. Have meat as a side dish, and have vegetables or grains as your main dish. This means having meat in small portions or adding small amounts of meat to foods like pasta or stew. Use beans or vegetables instead of meat in common dishes like chili or lasagna. Experiment with different cooking methods. Try roasting or broiling vegetables instead of steaming or sauteing them. Add frozen vegetables to soups, stews, pasta, or rice. Add nuts or seeds for added healthy fat at each meal. You can add these to yogurt, salads, or vegetable dishes. Marinate fish or vegetables using olive oil, lemon juice, garlic, and fresh herbs. Meal planning  Plan to eat 1 vegetarian meal one day each week. Try to work up to 2 vegetarian meals, if possible. Eat seafood 2 or more times a week. Have healthy snacks readily available, such as: Vegetable sticks with hummus. Greek yogurt. Fruit and nut trail mix. Eat balanced meals throughout the week. This includes: Fruit: 2-3 servings a day Vegetables: 4-5 servings a day Low-fat dairy: 2 servings a day Fish, poultry, or lean meat: 1 serving a day Beans and legumes: 2 or more servings a week Nuts and seeds: 1-2 servings a day Whole grains: 6-8 servings a day Extra-virgin olive oil: 3-4 servings a day Limit red meat and sweets to only a few servings a month What are my food choices? Mediterranean diet Recommended Grains: Whole-grain pasta. Brown rice. Bulgar wheat. Polenta. Couscous. Whole-wheat bread. Orpah Cobb. Vegetables: Artichokes. Beets. Broccoli. Cabbage. Carrots. Eggplant. Green beans. Chard. Kale. Spinach. Onions. Leeks. Peas. Squash. Tomatoes. Peppers. Radishes. Fruits: Apples. Apricots. Avocado. Berries. Bananas. Cherries. Dates. Figs. Grapes. Lemons. Melon. Oranges. Peaches. Plums.  Pomegranate. Meats and other protein foods: Beans. Almonds. Sunflower seeds. Pine nuts. Peanuts. Cod. Salmon. Scallops. Shrimp. Tuna. Tilapia. Clams. Oysters. Eggs. Dairy: Low-fat milk. Cheese. Greek yogurt. Beverages: Water. Red wine. Herbal tea. Fats and oils: Extra virgin olive oil. Avocado oil. Grape seed oil. Sweets and desserts: [place] yogurt with honey. Baked apples. Poached pears. Trail mix. Seasoning and other foods: Basil. Cilantro. Coriander. Cumin. Mint. Parsley. Sage. Rosemary. Tarragon. Garlic. Oregano. Thyme. Pepper. Balsalmic vinegar. Tahini. Hummus. Tomato sauce. Olives. Mushrooms. Limit these Grains: Prepackaged pasta or rice dishes. Prepackaged cereal with added sugar. Vegetables: Deep fried potatoes (french fries). Fruits: Fruit canned in syrup. Meats and other protein foods: Beef. Pork. Lamb. Poultry with skin. Hot dogs. Tomasa Blase. Dairy: Ice cream. Sour cream. Whole milk. Beverages: Juice. Sugar-sweetened soft drinks. Beer. Liquor and spirits. Fats and oils: Butter. Canola oil. Vegetable oil. Beef fat (tallow). Lard. Sweets and desserts: Cookies. Cakes. Pies. Candy. Seasoning and other foods: Mayonnaise. Premade sauces and marinades. The items listed may not be a complete list. Talk with your dietitian about what dietary choices are right for you. Summary The Mediterranean diet includes both food and lifestyle choices. Eat a variety of fresh fruits and vegetables, beans, nuts, seeds, and whole grains. Limit the amount of red meat  and sweets that you eat. Talk with your health care provider about whether it is safe for you to drink red wine in moderation. This means 1 glass a day for nonpregnant women and 2 glasses a day for men. A glass of wine equals 5 oz (150 mL). This information is not intended to replace advice given to you by your health care provider. Make sure you discuss any questions you have with your health care provider. Document Released: 11/09/2015 Document  Revised: 12/12/2015 Document Reviewed: 11/09/2015 Elsevier Interactive Patient Education  2017 [employer].

## 2023-05-14 NOTE — Progress Notes (Signed)
Assessment/Plan:   Dementia likely due to Alzheimer's disease   Shannon Mcintyre is a very pleasant 89 y.o. RH female with a history of hypertension, hyperlipidemia, psoriasis, HOH, vocal cord atrophy, slight ventriculomegaly per MRI of the brain without NPH symptoms seen today in follow up for memory loss. Patient is currently on donepezil 10 mg daily and memantine 5 mg twice daily.  MMSE is 24/30. Discussed the role of adherence to memantine, she will do so prior to considering increasing the dose. She is able to participate on her ADLs without difficulties, no longer drives    Follow up in  6 months. Continue donepezil 10 mg daily and memantine 5 mg twice daily, side effects discussed Continue B12 supplements Recommend good control of her cardiovascular risk factors. Patient informed of elevated BP Continue to control mood as per PCP Agree with checking hearing regularly in an effort to increase comprehension    Subjective:    This patient is accompanied in the office by her daughter who supplements the history.  Previous records as well as any outside records available were reviewed prior to todays visit. Patient was last seen on 11/11/2022 with MMSE 27/30    Any changes in memory since last visit? "About the same". She has been missing some of the  doses if not placed at her sight. She tries to remain busy.  She may forget recent information such as conversations, but she is good with names according to her daughter repeats oneself?  Endorsed, sometimes Disoriented when walking into a room?  Patient denies    Leaving objects?  May misplace things but not in unusual places   Wandering behavior?  denies   Any personality changes since last visit?  Denies.    Any worsening depression?:  Denies.   Hallucinations or paranoia?  Denies.   Seizures? Denies.    Any sleep changes?  Sleeps well. Denies vivid dreams, REM behavior or sleepwalking   Sleep apnea?   Denies.   Any hygiene  concerns? Denies.  Independent of bathing and dressing?  She needs some assistance with showering by her husband due to mobility issues. The caregiver comes to help 3 times a week as well. Does the patient needs help with medications?  Husband is in charge, she has a pillpack, she may forget the night dose, so she started placing it in the bathroom to remember. Who is in charge of the finances?  Husband is in charge   Any changes in appetite?  denies, she eats well.   Patient have trouble swallowing? Denies.   Does the patient cook? No Any headaches?   denies   Chronic back pain  denies   Ambulates with difficulty? She uses the Rolator, doing PT 2-3 times a week.  Recent falls or head injuries? denies     Unilateral weakness, numbness or tingling? denies   Any tremors?  Denies   Any anosmia?  Denies   Any incontinence of urine?  Endorsed, wears Pullups, no longer needs medicine.  Any bowel dysfunction?   Denies      Patient lives with her husband at [facility] Does the patient drive? No longer drives     HISTORY OF PRESENT ILLNESS 06/23/20: This is an 89 year old right-handed woman with a history of hypertension, hyperlipidemia, vocal cord atrophy, presenting for evaluation of memory loss and slight ventriculomegaly on brain MRI. Her husband is present and her son Shannon Mcintyre from [place] is on speakerphone to provide additional information.  She feels her memory is "pretty good as far as I can tell." Her husband started noticing memory changes around a year ago, however Shannon Mcintyre started noticing minor changes 5 years ago where she would pick up the wrong purse or ask the same question. She was misplacing things and asking where to put things back. They would find objects where they don't belong. Cooking was becoming an issue, she would leave the stove on. She stated she manages her own medications, Shannon Mcintyre reports medications are put in her pillbox and they give it to her AM and PM. Her  husband manages finances. She stopped driving after her knee surgery, "they won't let me drive," denies getting lost. Family agrees her sense of direction is perfect but reaction times are slower. They moved to [place] a year ago. A caregiver comes three times a week to help with showering. She is independent with dressing. There is no family history of dementia. No history of significant head injuries or alcohol use.   She denies any headaches, dizziness, diplopia, dysarthria, dysphagia, neck/back pain, focal numbness/tingling/weakness, anosmia, tremors. She has had problems with incontinence, there has been some improvement but "not fullproof," she still wears Depends and wakes up wet in the morning. They deny any falls, she states her walking could be better, she can't walk straight and sometimes has trouble walking. She did not want to do physical therapy after her 2 knee replacements and "walks like a cowboy." Sleep is good, no REM behavior disorder. Mood is pretty good, no paranoia or hallucinations.    I personally reviewed MRI brain with and without contrast done 03/2020 which did not show any acute changes. There was diffuse atrophy with ventricles slightly prominent relative to sulci, moderate chronic microvascular disease.   PREVIOUS MEDICATIONS:   CURRENT MEDICATIONS:  Outpatient Encounter Medications as of 05/14/2023  Medication Sig   alendronate (FOSAMAX) 70 MG tablet Take 70 mg by mouth once a week.   amLODipine (NORVASC) 2.5 MG tablet 1 tablet   Apremilast (OTEZLA PO) Take by mouth.   Biotin 1000 MCG tablet Take 1,000 mcg by mouth daily.    Cholecalciferol (VITAMIN D3) 50 MCG (2000 UT) capsule 1 capsule Orally Once a day   Cholecalciferol (VITAMIN D3) 50 MCG (2000 UT) TABS Take 2,000 Units by mouth daily.   Cyanocobalamin (VITAMIN B12) 1000 MCG TBCR 1 tablet   Folic Acid 5 MG CAPS Take 5 mg by mouth daily.    influenza vaccine adjuvanted (FLUAD QUADRIVALENT) 0.5 ML injection  Inject into the muscle.   [DISCONTINUED] donepezil (ARICEPT) 10 MG tablet TAKE ONE TABLET BY MOUTH ONCE DAILY   [DISCONTINUED] memantine (NAMENDA) 5 MG tablet Take 1 tablet twice a day   donepezil (ARICEPT) 10 MG tablet TAKE ONE TABLET BY MOUTH ONCE DAILY   memantine (NAMENDA) 5 MG tablet Take 1 tablet twice a day   No facility-administered encounter medications on file as of 05/14/2023.       05/14/2023    4:00 PM 11/11/2022   12:00 PM 05/07/2022   12:00 PM  MMSE - Mini Mental State Exam  Orientation to time 3 5 2   Orientation to Place 4 4 4   Registration 3 3 3   Attention/ Calculation 5 5 4   Recall 1 1 0  Language- name 2 objects 2 2 2   Language- repeat 1 1 1   Language- follow 3 step command 3 3 3   Language- read & follow direction 1 1 1   Write a sentence 1 1 1  Copy design 0 1 0  Total score 24 27 21       06/23/2020    2:00 PM  Montreal Cognitive Assessment   Visuospatial/ Executive (0/5) 0  Naming (0/3) 2  Attention: Read list of digits (0/2) 1  Attention: Read list of letters (0/1) 1  Attention: Serial 7 subtraction starting at 100 (0/3) 1  Language: Repeat phrase (0/2) 1  Language : Fluency (0/1) 0  Abstraction (0/2) 0  Delayed Recall (0/5) 0  Orientation (0/6) 5  Total 11  Adjusted Score (based on education) 11    Objective:     PHYSICAL EXAMINATION:    VITALS:   Vitals:   05/14/23 1534  BP: (!) 165/61  Pulse: 88  Resp: 18  SpO2: 98%  Weight: 143 lb (64.9 kg)    GEN:  The patient appears stated age and is in NAD. HEENT:  Normocephalic, atraumatic.   Neurological examination:  General: NAD, well-groomed, appears stated age. Orientation: The patient is alert. Oriented to person, place and not to date Cranial nerves: There is good facial symmetry.The speech is fluent and clear. No aphasia or dysarthria. Fund of knowledge is appropriate. Recent and remote memory are impaired. Attention and concentration are reduced.  Able to name objects and repeat  phrases.  Hearing is mildly decreased to conversational tone.   Sensation: Sensation is intact to light touch throughout Motor: Strength is at least antigravity x4. DTR's 2/4 in UE/LE     Movement examination: Tone: There is normal tone in the UE/LE Abnormal movements:  no tremor.  No myoclonus.  No asterixis.   Coordination:  There is mild  decremation with RAM's. Normal finger to nose  Gait and Station: The patient has no difficulty arising out of a deep-seated chair without the use of the hands.  Needs a cane to ambulate.  The patient's stride length is good.  Gait is cautious and wide-based, no magnetic steps noted.   Thank you for allowing [place] the opportunity to participate in the care of this nice patient. Please do not hesitate to contact us for any questions or concerns.   Total time spent on today's visit was 34 minutes dedicated to this patient today, preparing to see patient, examining the patient, ordering tests and/or medications and counseling the patient, documenting clinical information in the EHR or other health record, independently interpreting results and communicating results to the patient/family, discussing treatment and goals, answering patient's questions and coordinating care.  Cc:  Emilio Aspen, MD  Marlowe Kays 05/14/2023 4:43 PM

## 2023-05-20 DIAGNOSIS — M62562 Muscle wasting and atrophy, not elsewhere classified, left lower leg: Secondary | ICD-10-CM | POA: Diagnosis not present

## 2023-05-20 DIAGNOSIS — M62561 Muscle wasting and atrophy, not elsewhere classified, right lower leg: Secondary | ICD-10-CM | POA: Diagnosis not present

## 2023-05-20 DIAGNOSIS — R2681 Unsteadiness on feet: Secondary | ICD-10-CM | POA: Diagnosis not present

## 2023-05-20 DIAGNOSIS — R2689 Other abnormalities of gait and mobility: Secondary | ICD-10-CM | POA: Diagnosis not present

## 2023-05-22 DIAGNOSIS — R2681 Unsteadiness on feet: Secondary | ICD-10-CM | POA: Diagnosis not present

## 2023-05-22 DIAGNOSIS — M62562 Muscle wasting and atrophy, not elsewhere classified, left lower leg: Secondary | ICD-10-CM | POA: Diagnosis not present

## 2023-05-22 DIAGNOSIS — M62561 Muscle wasting and atrophy, not elsewhere classified, right lower leg: Secondary | ICD-10-CM | POA: Diagnosis not present

## 2023-05-22 DIAGNOSIS — R2689 Other abnormalities of gait and mobility: Secondary | ICD-10-CM | POA: Diagnosis not present

## 2023-05-23 DIAGNOSIS — R2689 Other abnormalities of gait and mobility: Secondary | ICD-10-CM | POA: Diagnosis not present

## 2023-05-23 DIAGNOSIS — R2681 Unsteadiness on feet: Secondary | ICD-10-CM | POA: Diagnosis not present

## 2023-05-23 DIAGNOSIS — M62561 Muscle wasting and atrophy, not elsewhere classified, right lower leg: Secondary | ICD-10-CM | POA: Diagnosis not present

## 2023-05-23 DIAGNOSIS — M62562 Muscle wasting and atrophy, not elsewhere classified, left lower leg: Secondary | ICD-10-CM | POA: Diagnosis not present

## 2023-05-27 DIAGNOSIS — M62561 Muscle wasting and atrophy, not elsewhere classified, right lower leg: Secondary | ICD-10-CM | POA: Diagnosis not present

## 2023-05-27 DIAGNOSIS — R2681 Unsteadiness on feet: Secondary | ICD-10-CM | POA: Diagnosis not present

## 2023-05-27 DIAGNOSIS — M62562 Muscle wasting and atrophy, not elsewhere classified, left lower leg: Secondary | ICD-10-CM | POA: Diagnosis not present

## 2023-05-27 DIAGNOSIS — R2689 Other abnormalities of gait and mobility: Secondary | ICD-10-CM | POA: Diagnosis not present

## 2023-05-28 DIAGNOSIS — R2689 Other abnormalities of gait and mobility: Secondary | ICD-10-CM | POA: Diagnosis not present

## 2023-05-28 DIAGNOSIS — M62562 Muscle wasting and atrophy, not elsewhere classified, left lower leg: Secondary | ICD-10-CM | POA: Diagnosis not present

## 2023-05-28 DIAGNOSIS — R2681 Unsteadiness on feet: Secondary | ICD-10-CM | POA: Diagnosis not present

## 2023-05-28 DIAGNOSIS — M62561 Muscle wasting and atrophy, not elsewhere classified, right lower leg: Secondary | ICD-10-CM | POA: Diagnosis not present

## 2023-06-02 DIAGNOSIS — M62562 Muscle wasting and atrophy, not elsewhere classified, left lower leg: Secondary | ICD-10-CM | POA: Diagnosis not present

## 2023-06-02 DIAGNOSIS — R2689 Other abnormalities of gait and mobility: Secondary | ICD-10-CM | POA: Diagnosis not present

## 2023-06-02 DIAGNOSIS — R2681 Unsteadiness on feet: Secondary | ICD-10-CM | POA: Diagnosis not present

## 2023-06-02 DIAGNOSIS — M62561 Muscle wasting and atrophy, not elsewhere classified, right lower leg: Secondary | ICD-10-CM | POA: Diagnosis not present

## 2023-06-03 DIAGNOSIS — M62562 Muscle wasting and atrophy, not elsewhere classified, left lower leg: Secondary | ICD-10-CM | POA: Diagnosis not present

## 2023-06-03 DIAGNOSIS — M62561 Muscle wasting and atrophy, not elsewhere classified, right lower leg: Secondary | ICD-10-CM | POA: Diagnosis not present

## 2023-06-03 DIAGNOSIS — R2689 Other abnormalities of gait and mobility: Secondary | ICD-10-CM | POA: Diagnosis not present

## 2023-06-03 DIAGNOSIS — R2681 Unsteadiness on feet: Secondary | ICD-10-CM | POA: Diagnosis not present

## 2023-06-05 DIAGNOSIS — M62562 Muscle wasting and atrophy, not elsewhere classified, left lower leg: Secondary | ICD-10-CM | POA: Diagnosis not present

## 2023-06-05 DIAGNOSIS — R2681 Unsteadiness on feet: Secondary | ICD-10-CM | POA: Diagnosis not present

## 2023-06-05 DIAGNOSIS — M62561 Muscle wasting and atrophy, not elsewhere classified, right lower leg: Secondary | ICD-10-CM | POA: Diagnosis not present

## 2023-06-05 DIAGNOSIS — R2689 Other abnormalities of gait and mobility: Secondary | ICD-10-CM | POA: Diagnosis not present

## 2023-06-09 DIAGNOSIS — R2681 Unsteadiness on feet: Secondary | ICD-10-CM | POA: Diagnosis not present

## 2023-06-09 DIAGNOSIS — M62562 Muscle wasting and atrophy, not elsewhere classified, left lower leg: Secondary | ICD-10-CM | POA: Diagnosis not present

## 2023-06-09 DIAGNOSIS — M62561 Muscle wasting and atrophy, not elsewhere classified, right lower leg: Secondary | ICD-10-CM | POA: Diagnosis not present

## 2023-06-09 DIAGNOSIS — R2689 Other abnormalities of gait and mobility: Secondary | ICD-10-CM | POA: Diagnosis not present

## 2023-06-16 DIAGNOSIS — R2689 Other abnormalities of gait and mobility: Secondary | ICD-10-CM | POA: Diagnosis not present

## 2023-06-16 DIAGNOSIS — M62561 Muscle wasting and atrophy, not elsewhere classified, right lower leg: Secondary | ICD-10-CM | POA: Diagnosis not present

## 2023-06-16 DIAGNOSIS — R2681 Unsteadiness on feet: Secondary | ICD-10-CM | POA: Diagnosis not present

## 2023-06-16 DIAGNOSIS — M62562 Muscle wasting and atrophy, not elsewhere classified, left lower leg: Secondary | ICD-10-CM | POA: Diagnosis not present

## 2023-07-09 DIAGNOSIS — J302 Other seasonal allergic rhinitis: Secondary | ICD-10-CM | POA: Diagnosis not present

## 2023-07-09 DIAGNOSIS — G301 Alzheimer's disease with late onset: Secondary | ICD-10-CM | POA: Diagnosis not present

## 2023-07-09 DIAGNOSIS — N1831 Chronic kidney disease, stage 3a: Secondary | ICD-10-CM | POA: Diagnosis not present

## 2023-07-09 DIAGNOSIS — I129 Hypertensive chronic kidney disease with stage 1 through stage 4 chronic kidney disease, or unspecified chronic kidney disease: Secondary | ICD-10-CM | POA: Diagnosis not present

## 2023-07-09 DIAGNOSIS — F02B0 Dementia in other diseases classified elsewhere, moderate, without behavioral disturbance, psychotic disturbance, mood disturbance, and anxiety: Secondary | ICD-10-CM | POA: Diagnosis not present

## 2023-07-10 DIAGNOSIS — L400 Psoriasis vulgaris: Secondary | ICD-10-CM | POA: Diagnosis not present

## 2023-07-10 DIAGNOSIS — Z79899 Other long term (current) drug therapy: Secondary | ICD-10-CM | POA: Diagnosis not present

## 2023-07-22 DIAGNOSIS — M545 Low back pain, unspecified: Secondary | ICD-10-CM | POA: Diagnosis not present

## 2023-09-15 DIAGNOSIS — F02B0 Dementia in other diseases classified elsewhere, moderate, without behavioral disturbance, psychotic disturbance, mood disturbance, and anxiety: Secondary | ICD-10-CM | POA: Diagnosis not present

## 2023-09-15 DIAGNOSIS — Z79899 Other long term (current) drug therapy: Secondary | ICD-10-CM | POA: Diagnosis not present

## 2023-09-15 DIAGNOSIS — G301 Alzheimer's disease with late onset: Secondary | ICD-10-CM | POA: Diagnosis not present

## 2023-09-15 DIAGNOSIS — J302 Other seasonal allergic rhinitis: Secondary | ICD-10-CM | POA: Diagnosis not present

## 2023-09-15 DIAGNOSIS — N3281 Overactive bladder: Secondary | ICD-10-CM | POA: Diagnosis not present

## 2023-09-15 DIAGNOSIS — R4181 Age-related cognitive decline: Secondary | ICD-10-CM | POA: Diagnosis not present

## 2023-09-15 DIAGNOSIS — N1831 Chronic kidney disease, stage 3a: Secondary | ICD-10-CM | POA: Diagnosis not present

## 2023-09-15 DIAGNOSIS — I129 Hypertensive chronic kidney disease with stage 1 through stage 4 chronic kidney disease, or unspecified chronic kidney disease: Secondary | ICD-10-CM | POA: Diagnosis not present

## 2023-09-15 DIAGNOSIS — M810 Age-related osteoporosis without current pathological fracture: Secondary | ICD-10-CM | POA: Diagnosis not present

## 2023-09-15 DIAGNOSIS — K219 Gastro-esophageal reflux disease without esophagitis: Secondary | ICD-10-CM | POA: Diagnosis not present

## 2023-09-15 DIAGNOSIS — R2689 Other abnormalities of gait and mobility: Secondary | ICD-10-CM | POA: Diagnosis not present

## 2023-09-15 DIAGNOSIS — R54 Age-related physical debility: Secondary | ICD-10-CM | POA: Diagnosis not present

## 2023-10-06 ENCOUNTER — Ambulatory Visit (INDEPENDENT_AMBULATORY_CARE_PROVIDER_SITE_OTHER): Admitting: Physician Assistant

## 2023-10-06 VITALS — BP 155/58 | HR 62 | Wt 136.0 lb

## 2023-10-06 DIAGNOSIS — G309 Alzheimer's disease, unspecified: Secondary | ICD-10-CM | POA: Diagnosis not present

## 2023-10-06 DIAGNOSIS — F0280 Dementia in other diseases classified elsewhere without behavioral disturbance: Secondary | ICD-10-CM | POA: Diagnosis not present

## 2023-10-06 MED ORDER — MEMANTINE HCL 10 MG PO TABS: ORAL_TABLET | ORAL | 3 refills | Status: AC

## 2023-10-06 NOTE — Patient Instructions (Signed)
 It was a pleasure to see you today at our office.   Recommendations:  Follow up in  6 months Continue donepezil  10 mg daily. Side effects were discussed  Increase memantine  to 10  mg twice a day    Whom to call:  Memory  decline, memory medications: Call our office [phone]   For psychiatric meds, mood meds: Please have your primary care physician manage these medications.   Counseling re  For assessment of decision of mental capacity and competency:  Call Dr. Rosaline Nine, geriatric psychiatrist at [phone]  For guidance in geriatric dementia issues please call [facility] [phone]     If you have any severe symptoms of a stroke, or other severe issues such as confusion,severe chills or fever, etc call 911 or go to the ER as you may need to be evaluated further   Feel free to visit Facebook page  [employer] for tips of how to care for people with memory problems.      RECOMMENDATIONS FOR ALL PATIENTS WITH MEMORY PROBLEMS: 1. Continue to exercise (Recommend 30 minutes of walking everyday, or 3 hours every week) 2. Increase social interactions - continue going to [place] and enjoy social gatherings with friends and family 3. Eat healthy, avoid fried foods and eat more fruits and vegetables 4. Maintain adequate blood pressure, blood sugar, and blood cholesterol level. Reducing the risk of stroke and cardiovascular disease also helps promoting better memory. 5. Avoid stressful situations. Live a simple life and avoid aggravations. Organize your time and prepare for the next day in anticipation. 6. Sleep well, avoid any interruptions of sleep and avoid any distractions in the bedroom that may interfere with adequate sleep quality 7. Avoid sugar, avoid sweets as there is a strong link between excessive sugar intake, diabetes, and cognitive impairment We discussed the Mediterranean diet, which has been shown to help patients reduce the risk of progressive memory  disorders and reduces cardiovascular risk. This includes eating fish, eat fruits and green leafy vegetables, nuts like almonds and hazelnuts, walnuts, and also use olive oil. Avoid fast foods and fried foods as much as possible. Avoid sweets and sugar as sugar use has been linked to worsening of memory function.  There is always a concern of gradual progression of memory problems. If this is the case, then we may need to adjust level of care according to patient needs. Support, both to the patient and caregiver, should then be put into place.    The Alzheimer's Association is here all day, every day for people facing Alzheimer's disease through our free 24/7 Helpline: (510) 105-2160. The Helpline provides reliable information and support to all those who need assistance, such as individuals living with memory loss, Alzheimer's or other dementia, caregivers, health care professionals and the public.  Our highly trained and knowledgeable staff can help you with: Understanding memory loss, dementia and Alzheimer's  Medications and other treatment options  General information about aging and brain health  Skills to provide quality care and to find the best care from professionals  Legal, financial and living-arrangement decisions Our Helpline also features: Confidential care consultation provided by master's level clinicians who can help with decision-making support, crisis assistance and education on issues families face every day  Help in a caller's preferred language using our translation service that features more than 200 languages and dialects  Referrals to local community programs, services and ongoing support     FALL PRECAUTIONS: Be cautious when walking. Scan the area  for obstacles that may increase the risk of trips and falls. When getting up in the mornings, sit up at the edge of the bed for a few minutes before getting out of bed. Consider elevating the bed at the head end to avoid drop of  blood pressure when getting up. Walk always in a well-lit room (use night lights in the walls). Avoid area rugs or power cords from appliances in the middle of the walkways. Use a walker or a cane if necessary and consider physical therapy for balance exercise. Get your eyesight checked regularly.  FINANCIAL OVERSIGHT: Supervision, especially oversight when making financial decisions or transactions is also recommended.  HOME SAFETY: Consider the safety of the kitchen when operating appliances like stoves, microwave oven, and blender. Consider having supervision and share cooking responsibilities until no longer able to participate in those. Accidents with firearms and other hazards in the house should be identified and addressed as well.   ABILITY TO BE LEFT ALONE: If patient is unable to contact 911 operator, consider using LifeLine, or when the need is there, arrange for someone to stay with patients. Smoking is a fire hazard, consider supervision or cessation. Risk of wandering should be assessed by caregiver and if detected at any point, supervision and safe proof recommendations should be instituted.  MEDICATION SUPERVISION: Inability to self-administer medication needs to be constantly addressed. Implement a mechanism to ensure safe administration of the medications.   DRIVING: Regarding driving, in patients with progressive memory problems, driving will be impaired. We advise to have someone else do the driving if trouble finding directions or if minor accidents are reported. Independent driving assessment is available to determine safety of driving.   If you are interested in the driving assessment, you can contact the following:  [facility] [employer] [facility] in [place] [phone]  [facility] [phone]  [facility] [phone] or [phone]      Mediterranean Diet A Mediterranean diet refers to food and  lifestyle choices that are based on the traditions of countries located on the [employer]. This way of eating has been shown to help prevent certain conditions and improve outcomes for people who have chronic diseases, like kidney disease and heart disease. What are tips for following this plan? Lifestyle  Cook and eat meals together with your family, when possible. Drink enough fluid to keep your urine clear or pale yellow. Be physically active every day. This includes: Aerobic exercise like running or swimming. Leisure activities like gardening, walking, or housework. Get 7-8 hours of sleep each night. If recommended by your health care provider, drink red wine in moderation. This means 1 glass a day for nonpregnant women and 2 glasses a day for men. A glass of wine equals 5 oz (150 mL). Reading food labels  Check the serving size of packaged foods. For foods such as rice and pasta, the serving size refers to the amount of cooked product, not dry. Check the total fat in packaged foods. Avoid foods that have saturated fat or trans fats. Check the ingredients list for added sugars, such as corn syrup. Shopping  At the grocery store, buy most of your food from the areas near the walls of the store. This includes: Fresh fruits and vegetables (produce). Grains, beans, nuts, and seeds. Some of these may be available in unpackaged forms or large amounts (in bulk). Fresh seafood. Poultry and eggs. Low-fat dairy products. Buy whole ingredients instead of prepackaged foods. Buy fresh  fruits and vegetables in-season from local farmers markets. Buy frozen fruits and vegetables in resealable bags. If you do not have access to quality fresh seafood, buy precooked frozen shrimp or canned fish, such as tuna, salmon, or sardines. Buy small amounts of raw or cooked vegetables, salads, or olives from the deli or salad bar at your store. Stock your pantry so you always have certain foods on hand, such  as olive oil, canned tuna, canned tomatoes, rice, pasta, and beans. Cooking  Cook foods with extra-virgin olive oil instead of using butter or other vegetable oils. Have meat as a side dish, and have vegetables or grains as your main dish. This means having meat in small portions or adding small amounts of meat to foods like pasta or stew. Use beans or vegetables instead of meat in common dishes like chili or lasagna. Experiment with different cooking methods. Try roasting or broiling vegetables instead of steaming or sauteing them. Add frozen vegetables to soups, stews, pasta, or rice. Add nuts or seeds for added healthy fat at each meal. You can add these to yogurt, salads, or vegetable dishes. Marinate fish or vegetables using olive oil, lemon juice, garlic, and fresh herbs. Meal planning  Plan to eat 1 vegetarian meal one day each week. Try to work up to 2 vegetarian meals, if possible. Eat seafood 2 or more times a week. Have healthy snacks readily available, such as: Vegetable sticks with hummus. Greek yogurt. Fruit and nut trail mix. Eat balanced meals throughout the week. This includes: Fruit: 2-3 servings a day Vegetables: 4-5 servings a day Low-fat dairy: 2 servings a day Fish, poultry, or lean meat: 1 serving a day Beans and legumes: 2 or more servings a week Nuts and seeds: 1-2 servings a day Whole grains: 6-8 servings a day Extra-virgin olive oil: 3-4 servings a day Limit red meat and sweets to only a few servings a month What are my food choices? Mediterranean diet Recommended Grains: Whole-grain pasta. Brown rice. Bulgar wheat. Polenta. Couscous. Whole-wheat bread. [place] Madeira. Vegetables: Artichokes. Beets. Broccoli. Cabbage. Carrots. Eggplant. Green beans. Chard. Kale. Spinach. Onions. Leeks. Peas. Squash. Tomatoes. Peppers. Radishes. Fruits: Apples. Apricots. Avocado. Berries. Bananas. Cherries. Dates. Figs. Grapes. Lemons. Melon. Oranges. Peaches. Plums.  Pomegranate. Meats and other protein foods: Beans. Almonds. Sunflower seeds. Pine nuts. Peanuts. Cod. Salmon. Scallops. Shrimp. Tuna. Tilapia. Clams. Oysters. Eggs. Dairy: Low-fat milk. Cheese. Greek yogurt. Beverages: Water . Red wine. Herbal tea. Fats and oils: Extra virgin olive oil. Avocado oil. Grape seed oil. Sweets and desserts: [place] yogurt with honey. Baked apples. Poached pears. Trail mix. Seasoning and other foods: Basil. Cilantro. Coriander. Cumin. Mint. Parsley. Sage. Rosemary. Tarragon. Garlic. Oregano. Thyme. Pepper. Balsalmic vinegar. Tahini. Hummus. Tomato sauce. Olives. Mushrooms. Limit these Grains: Prepackaged pasta or rice dishes. Prepackaged cereal with added sugar. Vegetables: Deep fried potatoes (french fries). Fruits: Fruit canned in syrup. Meats and other protein foods: Beef. Pork. Lamb. Poultry with skin. Hot dogs. [place]. Dairy: Ice cream. Sour cream. Whole milk. Beverages: Juice. Sugar-sweetened soft drinks. Beer. Liquor and spirits. Fats and oils: Butter. Canola oil. Vegetable oil. Beef fat (tallow). Lard. Sweets and desserts: Cookies. Cakes. Pies. Candy. Seasoning and other foods: Mayonnaise. Premade sauces and marinades. The items listed may not be a complete list. Talk with your dietitian about what dietary choices are right for you. Summary The Mediterranean diet includes both food and lifestyle choices. Eat a variety of fresh fruits and vegetables, beans, nuts, seeds, and whole grains. Limit the amount of  red meat and sweets that you eat. Talk with your health care provider about whether it is safe for you to drink red wine in moderation. This means 1 glass a day for nonpregnant women and 2 glasses a day for men. A glass of wine equals 5 oz (150 mL). This information is not intended to replace advice given to you by your health care provider. Make sure you discuss any questions you have with your health care provider. Document Released: 11/09/2015 Document  Revised: 12/12/2015 Document Reviewed: 11/09/2015 Elsevier Interactive Patient Education  2017 [employer].

## 2023-10-06 NOTE — Progress Notes (Signed)
 Assessment/Plan:   Dementia due to Alzheimer's disease   Shannon Mcintyre is a very pleasant 89 y.o. RH female with a history of hypertension, hyperlipidemia, psoriasis, HOH, vocal cord atrophy, slight ventriculomegaly per MRI of the brain without NPH symptoms seen today in follow up for memory loss. Patient is currently on donepezil  10 mg daily and memantine  5 mg twice daily. Memory changes noted . MMSE today is 18/30. Discussed increasing memantine  to 10 mg bid  in an effort to slow down cognitive decline, patient  and her daughters agree.  Patient is able to participate on ADLs with some assistance, no longer drives.  Mood is good .    Follow up in 6  months. Continue donepezil  10 mg daily and increase memantine  to 10 mg twice daily, side effects discussed Recommend good control of her cardiovascular risk factors Continue to control mood as per PCP Continue to check hearing in an effort to increase comprehension     Subjective:    This patient is accompanied in the office by her daughters who supplements the history.  Previous records as well as any outside records available were reviewed prior to todays visit. Patient was last seen on 05/14/2023 with MMSE 24/30.   Any changes in memory since last visit? About the same.  She tries to remain busy.  She might forget recent information such as conversations, but she is good with names according to her daughters. LTM is good.  repeats oneself?  Endorsed Disoriented when walking into a room? Denies    Leaving objects? Denies  Wandering behavior?  denies   Any personality changes since last visit?  Denies.   Any worsening depression?:  Denies.   Hallucinations or paranoia?  Denies.   Seizures? denies    Any sleep changes?  Sleeps well 10 h-12 h at night.  Denies vivid dreams, REM behavior or sleepwalking   Sleep apnea?   Denies.   Any hygiene concerns? Denies.  Independent of bathing and dressing? She needs some assistance   Does the patient needs help with medications?  Daughters and caregivers in charge   Who is in charge of the finances?  Husband is in charge    Any changes in appetite?  denies     Patient have trouble swallowing? Denies.   Does the patient cook? No Any headaches?   denies   Any vision changes? Denies  Chronic back pain  denies   Ambulates with difficulty? Denies.  Uses a rollator for stability Recent falls or head injuries? Denies.     Unilateral weakness, numbness or tingling? denies   Any tremors?  Denies   Any anosmia?  Denies   Any incontinence of urine?  Endorsed, wears Pull-ups  Any bowel dysfunction?   Occasional diarrhea      Patient lives with her husband at [facility]  Does the patient drive? No longer drives      HISTORY OF PRESENT ILLNESS 06/23/20: This is an 89 year old right-handed woman with a history of hypertension, hyperlipidemia, vocal cord atrophy, presenting for evaluation of memory loss and slight ventriculomegaly on brain MRI. Her husband is present and her son Johnie from [place]  is on speakerphone to provide additional information. She feels her memory is pretty good as far as I can tell. Her husband started noticing memory changes around a year ago, however Johnie started noticing minor changes 5 years ago where she would pick up the wrong purse or ask the same question. She was  misplacing things and asking where to put things back. They would find objects where they don't belong. Cooking was becoming an issue, she would leave the stove on. She stated she manages her own medications, [place] reports medications are put in her pillbox and they give it to her AM and PM. Her husband manages finances. She stopped driving after her knee surgery, they won't let me drive, denies getting lost. Family agrees her sense of direction is perfect but reaction times are slower. They moved to [place] a year ago. A caregiver comes three times a week to help with  showering. She is independent with dressing. There is no family history of dementia. No history of significant head injuries or alcohol use.   She denies any headaches, dizziness, diplopia, dysarthria, dysphagia, neck/back pain, focal numbness/tingling/weakness, anosmia, tremors. She has had problems with incontinence, there has been some improvement but not foolproof, she still wears Depends and wakes up wet in the morning. They deny any falls, she states her walking could be better, she can't walk straight and sometimes has trouble walking. She did not want to do physical therapy after her 2 knee replacements and walks like a cowboy. Sleep is good, no REM behavior disorder. Mood is pretty good, no paranoia or hallucinations.    I personally reviewed MRI brain with and without contrast done 03/2020 which did not show any acute changes. There was diffuse atrophy with ventricles slightly prominent relative to sulci, moderate chronic microvascular disease.  PREVIOUS MEDICATIONS:   CURRENT MEDICATIONS:  Outpatient Encounter Medications as of 10/06/2023  Medication Sig   alendronate (FOSAMAX) 70 MG tablet Take 70 mg by mouth once a week.   amLODipine (NORVASC) 2.5 MG tablet 1 tablet   Apremilast (OTEZLA PO) Take by mouth.   Biotin 1000 MCG tablet Take 1,000 mcg by mouth daily.    Cholecalciferol (VITAMIN D3) 50 MCG (2000 UT) capsule 1 capsule Orally Once a day   Cholecalciferol (VITAMIN D3) 50 MCG (2000 UT) TABS Take 2,000 Units by mouth daily.   Cyanocobalamin (VITAMIN B12) 1000 MCG TBCR 1 tablet   donepezil  (ARICEPT ) 10 MG tablet TAKE ONE TABLET BY MOUTH ONCE DAILY   Folic Acid  5 MG CAPS Take 5 mg by mouth daily.    influenza vaccine adjuvanted (FLUAD  QUADRIVALENT) 0.5 ML injection Inject into the muscle.   memantine  (NAMENDA ) 10 MG tablet Take 1 tablet twice a day   [DISCONTINUED] memantine  (NAMENDA ) 5 MG tablet Take 1 tablet twice a day   No facility-administered encounter medications on  file as of 10/06/2023.       10/06/2023    4:00 PM 05/14/2023    4:00 PM 11/11/2022   12:00 PM  MMSE - Mini Mental State Exam  Orientation to time 2 3 5   Orientation to Place 3 4 4   Registration 3 3 3   Attention/ Calculation 1 5 5   Recall 0 1 1  Language- name 2 objects 2 2 2   Language- repeat 1 1 1   Language- follow 3 step command 3 3 3   Language- read & follow direction 1 1 1   Write a sentence 1 1 1   Copy design 1 0 1  Total score 18 24 27       06/23/2020    2:00 PM  Montreal Cognitive Assessment   Visuospatial/ Executive (0/5) 0  Naming (0/3) 2  Attention: Read list of digits (0/2) 1  Attention: Read list of letters (0/1) 1  Attention: Serial 7 subtraction starting at 100 (0/3)  1  Language: Repeat phrase (0/2) 1  Language : Fluency (0/1) 0  Abstraction (0/2) 0  Delayed Recall (0/5) 0  Orientation (0/6) 5  Total 11  Adjusted Score (based on education) 11    Objective:     PHYSICAL EXAMINATION:    VITALS:   Vitals:   10/06/23 1459  BP: (!) 155/58  Pulse: 62  SpO2: 97%  Weight: 136 lb (61.7 kg)    GEN:  The patient appears stated age and is in NAD. HEENT:  Normocephalic, atraumatic.   Neurological examination:  General: NAD, well-groomed, appears stated age. Orientation: The patient is alert. Oriented to person, not to place and date Cranial nerves: There is good facial symmetry.The speech is fluent and clear. No aphasia or dysarthria. Fund of knowledge is reduced. Recent and remote memory are impaired. Attention and concentration are reduced. Able to name objects and repeat phrases.  Hearing is mildly decreased to conversational tone.   Sensation: Sensation is intact to light touch throughout Motor: Strength is at least antigravity x4. DTR's 2/4 in UE/LE     Movement examination: Tone: There is normal tone in the UE/LE Abnormal movements:  no tremor.  No myoclonus.  No asterixis.   Coordination:  There is some, decremation with RAM's. Normal finger to  nose  Gait and Station: The patient has no  difficulty arising out of a deep-seated chair without the use of the hands. Uses a walker for stability. The patient's stride length is good.  Gait is cautious and wide based, no magnetic steps noted.   Thank you for allowing us  the opportunity to participate in the care of this nice patient. Please do not hesitate to contact us  for any questions or concerns.   Total time spent on today's visit was 28 minutes dedicated to this patient today, preparing to see patient, examining the patient, ordering tests and/or medications and counseling the patient, documenting clinical information in the EHR or other health record, independently interpreting results and communicating results to the patient/family, discussing treatment and goals, answering patient's questions and coordinating care.  Cc:  Charlott Dorn LABOR, MD  Camie Sevin 10/06/2023 4:27 PM

## 2023-11-10 DIAGNOSIS — Z79899 Other long term (current) drug therapy: Secondary | ICD-10-CM | POA: Diagnosis not present

## 2023-11-10 DIAGNOSIS — L400 Psoriasis vulgaris: Secondary | ICD-10-CM | POA: Diagnosis not present

## 2023-11-11 ENCOUNTER — Ambulatory Visit: Payer: Medicare Other | Admitting: Physician Assistant

## 2023-12-30 DIAGNOSIS — Z23 Encounter for immunization: Secondary | ICD-10-CM | POA: Diagnosis not present

## 2023-12-31 ENCOUNTER — Observation Stay (HOSPITAL_COMMUNITY)
Admission: EM | Admit: 2023-12-31 | Discharge: 2024-01-01 | Disposition: A | Discharge status: Home / Self Care | Attending: Internal Medicine | Admitting: Internal Medicine

## 2023-12-31 ENCOUNTER — Emergency Department (HOSPITAL_COMMUNITY)

## 2023-12-31 ENCOUNTER — Other Ambulatory Visit: Payer: Self-pay

## 2023-12-31 DIAGNOSIS — R509 Fever, unspecified: Secondary | ICD-10-CM | POA: Insufficient documentation

## 2023-12-31 DIAGNOSIS — N1831 Chronic kidney disease, stage 3a: Secondary | ICD-10-CM | POA: Diagnosis not present

## 2023-12-31 DIAGNOSIS — Z96653 Presence of artificial knee joint, bilateral: Secondary | ICD-10-CM | POA: Diagnosis not present

## 2023-12-31 DIAGNOSIS — R410 Disorientation, unspecified: Secondary | ICD-10-CM | POA: Diagnosis not present

## 2023-12-31 DIAGNOSIS — F0390 Unspecified dementia without behavioral disturbance: Secondary | ICD-10-CM | POA: Insufficient documentation

## 2023-12-31 DIAGNOSIS — R5383 Other fatigue: Secondary | ICD-10-CM | POA: Diagnosis present

## 2023-12-31 DIAGNOSIS — R531 Weakness: Secondary | ICD-10-CM | POA: Insufficient documentation

## 2023-12-31 DIAGNOSIS — R4182 Altered mental status, unspecified: Secondary | ICD-10-CM | POA: Diagnosis not present

## 2023-12-31 DIAGNOSIS — R918 Other nonspecific abnormal finding of lung field: Secondary | ICD-10-CM | POA: Diagnosis not present

## 2023-12-31 DIAGNOSIS — I129 Hypertensive chronic kidney disease with stage 1 through stage 4 chronic kidney disease, or unspecified chronic kidney disease: Secondary | ICD-10-CM | POA: Insufficient documentation

## 2023-12-31 DIAGNOSIS — R404 Transient alteration of awareness: Secondary | ICD-10-CM | POA: Diagnosis not present

## 2023-12-31 DIAGNOSIS — A419 Sepsis, unspecified organism: Secondary | ICD-10-CM | POA: Diagnosis not present

## 2023-12-31 DIAGNOSIS — I10 Essential (primary) hypertension: Secondary | ICD-10-CM | POA: Diagnosis not present

## 2023-12-31 DIAGNOSIS — G9389 Other specified disorders of brain: Secondary | ICD-10-CM | POA: Diagnosis not present

## 2023-12-31 LAB — CBC WITH DIFFERENTIAL/PLATELET
Abs Immature Granulocytes: 0.04 K/uL (ref 0.00–0.07)
Basophils Absolute: 0 K/uL (ref 0.0–0.1)
Basophils Relative: 0 %
Eosinophils Absolute: 0 K/uL (ref 0.0–0.5)
Eosinophils Relative: 0 %
HCT: 36.7 % (ref 36.0–46.0)
Hemoglobin: 12.8 g/dL (ref 12.0–15.0)
Immature Granulocytes: 1 %
Lymphocytes Relative: 6 %
Lymphs Abs: 0.4 K/uL — ABNORMAL LOW (ref 0.7–4.0)
MCH: 32.4 pg (ref 26.0–34.0)
MCHC: 34.9 g/dL (ref 30.0–36.0)
MCV: 92.9 fL (ref 80.0–100.0)
Monocytes Absolute: 1 K/uL (ref 0.1–1.0)
Monocytes Relative: 14 %
Neutro Abs: 5.4 K/uL (ref 1.7–7.7)
Neutrophils Relative %: 79 %
Platelets: 138 K/uL — ABNORMAL LOW (ref 150–400)
RBC: 3.95 MIL/uL (ref 3.87–5.11)
RDW: 13.5 % (ref 11.5–15.5)
WBC: 6.9 K/uL (ref 4.0–10.5)
nRBC: 0 % (ref 0.0–0.2)

## 2023-12-31 LAB — RESP PANEL BY RT-PCR (RSV, FLU A&B, COVID)  RVPGX2
Influenza A by PCR: NEGATIVE
Influenza B by PCR: NEGATIVE
Resp Syncytial Virus by PCR: NEGATIVE
SARS Coronavirus 2 by RT PCR: NEGATIVE

## 2023-12-31 LAB — URINALYSIS, W/ REFLEX TO CULTURE (INFECTION SUSPECTED)
Bacteria, UA: NONE SEEN
Bilirubin Urine: NEGATIVE
Glucose, UA: NEGATIVE mg/dL
Hgb urine dipstick: NEGATIVE
Ketones, ur: NEGATIVE mg/dL
Leukocytes,Ua: NEGATIVE
Nitrite: NEGATIVE
Protein, ur: NEGATIVE mg/dL
Specific Gravity, Urine: 1.019 (ref 1.005–1.030)
pH: 6 (ref 5.0–8.0)

## 2023-12-31 LAB — COMPREHENSIVE METABOLIC PANEL WITH GFR
ALT: 30 U/L (ref 0–44)
AST: 28 U/L (ref 15–41)
Albumin: 3.4 g/dL — ABNORMAL LOW (ref 3.5–5.0)
Alkaline Phosphatase: 54 U/L (ref 38–126)
Anion gap: 9 (ref 5–15)
BUN: 12 mg/dL (ref 8–23)
CO2: 26 mmol/L (ref 22–32)
Calcium: 8.7 mg/dL — ABNORMAL LOW (ref 8.9–10.3)
Chloride: 104 mmol/L (ref 98–111)
Creatinine, Ser: 0.82 mg/dL (ref 0.44–1.00)
GFR, Estimated: >60 mL/min (ref >60)
Glucose, Bld: 102 mg/dL — ABNORMAL HIGH (ref 70–99)
Potassium: 4 mmol/L (ref 3.5–5.1)
Sodium: 139 mmol/L (ref 135–145)
Total Bilirubin: 0.6 mg/dL (ref 0.0–1.2)
Total Protein: 5.9 g/dL — ABNORMAL LOW (ref 6.5–8.1)

## 2023-12-31 LAB — BLOOD GAS, VENOUS
Acid-Base Excess: 2 mmol/L (ref 0.0–2.0)
Bicarbonate: 25.6 mmol/L (ref 20.0–28.0)
O2 Saturation: 99.2 %
Patient temperature: 36.7
pCO2, Ven: 35 mmHg — ABNORMAL LOW (ref 44–60)
pH, Ven: 7.47 — ABNORMAL HIGH (ref 7.25–7.43)
pO2, Ven: 86 mmHg — ABNORMAL HIGH (ref 32–45)

## 2023-12-31 LAB — TSH: TSH: 1.928 u[IU]/mL (ref 0.350–4.500)

## 2023-12-31 LAB — CBG MONITORING, ED: Glucose-Capillary: 94 mg/dL (ref 70–99)

## 2023-12-31 LAB — I-STAT CG4 LACTIC ACID, ED
Lactic Acid, Venous: 1.3 mmol/L (ref 0.5–1.9)
Lactic Acid, Venous: 1.2 mmol/L (ref 0.5–1.9)

## 2023-12-31 LAB — AMMONIA: Ammonia: 47 umol/L — ABNORMAL HIGH (ref 9–35)

## 2023-12-31 MED ORDER — ENOXAPARIN SODIUM 40 MG/0.4ML IJ SOSY
40.0000 mg | PREFILLED_SYRINGE | INTRAMUSCULAR | Status: DC
  Administered 2024-01-01: 40 mg via SUBCUTANEOUS
  Filled 2023-12-31: qty 0.4

## 2023-12-31 MED ORDER — ACETAMINOPHEN 325 MG PO TABS: 650.0000 mg | ORAL_TABLET | Freq: Four times a day (QID) | ORAL | Status: DC | PRN

## 2023-12-31 MED ORDER — ACETAMINOPHEN 650 MG RE SUPP: 650.0000 mg | Freq: Four times a day (QID) | RECTAL | Status: DC | PRN

## 2023-12-31 MED ORDER — SODIUM CHLORIDE 0.9 % IV BOLUS
1000.0000 mL | Freq: Once | INTRAVENOUS | Status: AC
Start: 2023-12-31 — End: 2023-12-31
  Administered 2023-12-31: 1000 mL via INTRAVENOUS

## 2023-12-31 MED ORDER — MAGNESIUM HYDROXIDE 400 MG/5ML PO SUSP: 30.0000 mL | Freq: Every day | ORAL | Status: DC | PRN

## 2023-12-31 MED ORDER — ACETAMINOPHEN 500 MG PO TABS
1000.0000 mg | ORAL_TABLET | Freq: Once | ORAL | Status: AC
  Administered 2023-12-31: 1000 mg via ORAL
  Filled 2023-12-31: qty 2

## 2023-12-31 NOTE — ED Provider Notes (Signed)
 [place] EMERGENCY DEPARTMENT AT [facility] Provider Note   CSN: [id number] Arrival date & time: 12/31/23  1159     Patient presents with: No chief complaint on file.   Shannon Mcintyre is a 89 y.o. female.   89 yo F with a chief complaint of fatigue.  Noticed this morning.  She denies any other specific symptoms.  Felt she was okay yesterday.  Denies cough congestion or fever denies nausea vomiting or diarrhea denies abdominal pain.  She is hungry now would like to eat something.  She denies rash or tick bite.        Prior to Admission medications   Medication Sig Start Date End Date Taking? Authorizing Provider  alendronate (FOSAMAX) 70 MG tablet Take 70 mg by mouth once a week.    [provider]  amLODipine (NORVASC) 2.5 MG tablet 1 tablet 06/18/19   [provider]  Apremilast (OTEZLA PO) Take by mouth.    [provider]  Biotin 1000 MCG tablet Take 1,000 mcg by mouth daily.     [provider]  Cholecalciferol (VITAMIN D3) 50 MCG (2000 UT) capsule 1 capsule Orally Once a day    [provider]  Cholecalciferol (VITAMIN D3) 50 MCG (2000 UT) TABS Take 2,000 Units by mouth daily.    [provider]  Cyanocobalamin (VITAMIN B12) 1000 MCG TBCR 1 tablet 03/09/20   [provider]  donepezil  (ARICEPT ) 10 MG tablet TAKE ONE TABLET BY MOUTH ONCE DAILY 05/14/23   Wertman, Sara E, PA-C  Folic Acid  5 MG CAPS Take 5 mg by mouth daily.     [provider]  influenza vaccine adjuvanted (FLUAD  QUADRIVALENT) 0.5 ML injection Inject into the muscle. 12/31/21   Luiz Channel, MD  memantine  (NAMENDA ) 10 MG tablet Take 1 tablet twice a day 10/06/23   Wertman, Sara E, PA-C    Allergies: Patient has no known allergies.    Review of Systems  Updated Vital Signs BP (!) 150/61   Pulse 66   Temp 99.8 F (37.7 C) (Oral)   Resp 19   Ht 5' 6 (1.676 m)   Wt 59 kg   SpO2 99%   BMI 20.98 kg/m   Physical  Exam Vitals and nursing note reviewed.  Constitutional:      General: She is not in acute distress.    Appearance: She is well-developed. She is not diaphoretic.  HENT:     Head: Normocephalic and atraumatic.  Eyes:     Pupils: Pupils are equal, round, and reactive to light.  Cardiovascular:     Rate and Rhythm: Normal rate and regular rhythm.     Heart sounds: No murmur heard.    No friction rub. No gallop.  Pulmonary:     Effort: Pulmonary effort is normal.     Breath sounds: No wheezing or rales.  Abdominal:     General: There is no distension.     Palpations: Abdomen is soft.     Tenderness: There is no abdominal tenderness.  Musculoskeletal:        General: No tenderness.     Cervical back: Normal range of motion and neck supple.  Skin:    General: Skin is warm and dry.  Neurological:     Mental Status: She is alert and oriented to person, place, and time.  Psychiatric:        Behavior: Behavior normal.     (all labs ordered are listed, but only  abnormal results are displayed) Labs Reviewed  COMPREHENSIVE METABOLIC PANEL WITH GFR - Abnormal; Notable for the following components:      Result Value   Glucose, Bld 102 (*)    Calcium 8.7 (*)    Total Protein 5.9 (*)    Albumin 3.4 (*)    All other components within normal limits  CBC WITH DIFFERENTIAL/PLATELET - Abnormal; Notable for the following components:   Platelets 138 (*)    Lymphs Abs 0.4 (*)    All other components within normal limits  URINALYSIS, W/ REFLEX TO CULTURE (INFECTION SUSPECTED) - Abnormal; Notable for the following components:   APPearance HAZY (*)    All other components within normal limits  CULTURE, BLOOD (ROUTINE X 2)  CULTURE, BLOOD (ROUTINE X 2)  RESP PANEL BY RT-PCR (RSV, FLU A&B, COVID)  RVPGX2  CBG MONITORING, ED  I-STAT CG4 LACTIC ACID, ED  I-STAT CG4 LACTIC ACID, ED    EKG: EKG Interpretation Date/Time:  Wednesday December 31 2023 12:06:50 EDT Ventricular Rate:  67 PR  Interval:  146 QRS Duration:  97 QT Interval:  416 QTC Calculation: 440 R Axis:   46  Text Interpretation: Sinus rhythm Low voltage, precordial leads Borderline T abnormalities, anterior leads Baseline wander in lead(s) I III aVL No significant change since last tracing Confirmed by Emil Share [phone]) on 12/31/2023 12:16:46 PM  Radiology: [facility] Chest Port 1 View Result Date: 12/31/2023 CLINICAL DATA:  Weakness, sepsis. EXAM: PORTABLE CHEST 1 VIEW COMPARISON:  May 16, 2022. FINDINGS: The heart size and mediastinal contours are within normal limits. Right lung is clear. Minimal left basilar subsegmental atelectasis or scarring is noted. The visualized skeletal structures are unremarkable. IMPRESSION: Minimal left basilar subsegmental atelectasis or scarring. Electronically Signed   By: Lynwood Landy Raddle M.D.   On: 12/31/2023 12:47     Procedures   Medications Ordered in the ED  sodium chloride  0.9 % bolus 1,000 mL (has no administration in time range)  acetaminophen  (TYLENOL ) tablet 1,000 mg (has no administration in time range)                                    Medical Decision Making Amount and/or Complexity of Data Reviewed Labs: ordered. Radiology: ordered.  Risk OTC drugs.   89 yo F with a chief complaint of not feeling well.  Going on since this morning.  No known sick contacts.  Oral temp 99.8.  Warm to touch.  Suspect likely has a fever.  Will obtain laboratory evaluation chest x-ray UA COVID flu RSV.  bolus IV fluids.  Reassess.  Family has arrived and provides further history.  They feel that she has been much more sleepy than normal.  Normally has no trouble carrying on a conversation.  They deny any recent medication changes or injuries.  Will obtain CT of the head.  Will discuss with medicine for possible admission.  Patient care signed out to Dr. Midge, please see their note for further details of care in the ED.  The patients results and plan were reviewed  and discussed.   Any x-rays performed were independently reviewed by myself.   Differential diagnosis were considered with the presenting HPI.  Medications  sodium chloride  0.9 % bolus 1,000 mL (has no administration in time range)  acetaminophen  (TYLENOL ) tablet 1,000 mg (has no administration in time range)    Vitals:   12/31/23 1208 12/31/23 1230  BP: (!) 145/51 (!) 150/61  Pulse: 67 66  Resp: 17 19  Temp: 99.8 F (37.7 C)   TempSrc: Oral   SpO2: 99% 99%  Weight: 59 kg   Height: 5' 6 (1.676 m)     Final diagnoses:  Delirium    Admission/ observation were discussed with the admitting physician, patient and/or family and they are comfortable with the plan.       Final diagnoses:  Delirium    ED Discharge Orders     None          Emil Share, [place] 12/31/23 1546

## 2023-12-31 NOTE — Progress Notes (Signed)
 Patient arrived to the Unit at 1835, Charge RN got the patient settled in the room and the NT got admission vitals.  I informed the night shift RN that all the admission charting still needed to be completed for this patient.

## 2023-12-31 NOTE — H&P (Signed)
 History and Physical    Patient: Shannon Mcintyre FMW:[id number] DOB: 01-Apr-1933 DOA: 12/31/2023 DOS: the patient was seen and examined on 12/31/2023 PCP: Charlott Dorn LABOR, MD  Patient coming from: Home  Chief Complaint: No chief complaint on file.  HPI: Shannon Mcintyre is a 89 y.o. female with medical history significant of dementia, hypertension, CKD stage IIIa, GERD who presented to the ED for evaluation of altered mental status and generalized weakness.  Patient is poor historian, family at bedside provided history.  They report that patient's caregiver was up with patient to use the bathroom around 2:30 AM and noted patient to have unsteady gait and not acting like herself.  This morning, patient was too weak to sit up and did not want to get out of bed which is apparently very unusual for her.  Patient was disoriented and her own living space, confusing the kitchen with her den when going for breakfast.  Throughout today, patient has been difficult to keep awake.  Family report patient received both flu and COVID vaccines yesterday.  Since then, she has become progressively weak, fatigued and more confused than her baseline  Patient is awake during encounter.  She denies feeling sick, having bodyaches, fevers or chills, abdominal pain or nausea vomiting, diarrhea, cough, congestion, sore throat, headache or pain with urination.  ED course: Temp 99.8, HR 67, RR 17, initial BP 145/51, SpO2 99% on room air.  Labs were obtained including CMP and CBC which were notable only for nonfasting glucose 102, calcium 8.7, albumin 3.4, platelets 138.  Lactic acid was normal twice at 1.3 and 1.2.  Patient tested negative for influenza A/B, RSV and COVID.  UA had no signs of infection.  Chest x-ray and CT head without contrast were nonacute.    Patient was given 1 L bolus normal saline in the ED.  Blood cultures were obtained and hospitalist service was consulted for admission and further  evaluation   Review of Systems: As mentioned in the history of present illness. All other systems reviewed and are negative.   Past Medical History:  Diagnosis Date   Arthritis    Closed fracture of left olecranon process 07/28/2018   Diverticulosis of colon    Eczema    Hypertension    Left elbow fracture    S/P total knee replacement, left    Past Surgical History:  Procedure Laterality Date   ABDOMINAL HYSTERECTOMY  1980   BSO   APPENDECTOMY  1980   knee arthoscopy  2010   right   KNEE ARTHROSCOPY  2001   left   ORIF ELBOW FRACTURE Left 07/28/2018   Procedure: OPEN REDUCTION INTERNAL FIXATION (ORIF) ELBOW/OLECRANON FRACTURE;  Surgeon: Josefina Chew, MD;  Location: [facility];  Service: Orthopedics;  Laterality: Left;   TOTAL KNEE ARTHROPLASTY Left 08/12/2016   Procedure: LEFT TOTAL KNEE ARTHROPLASTY;  Surgeon: Jane Charleston, MD;  Location: [facility] OR;  Service: Orthopedics;  Laterality: Left;   TOTAL KNEE ARTHROPLASTY Right 06/28/2019   Procedure: TOTAL KNEE ARTHROPLASTY;  Surgeon: Jane Charleston, MD;  Location: [facility];  Service: Orthopedics;  Laterality: Right;   Social History:  reports that she has never smoked. She has never used smokeless tobacco. She reports that she does not drink alcohol and does not use drugs.  No Known Allergies  Family History  Problem Relation Age of Onset   Pneumonia Mother 49   Heart attack Father 64       Possibly CAD starting in his  49s   Parkinson's disease Brother     Prior to Admission medications   Medication Sig Start Date End Date Taking? Authorizing Provider  alendronate (FOSAMAX) 70 MG tablet Take 70 mg by mouth once a week.    [provider]  amLODipine (NORVASC) 2.5 MG tablet 1 tablet 06/18/19   [provider]  Apremilast (OTEZLA PO) Take by mouth.    [provider]  Biotin 1000 MCG tablet Take 1,000 mcg by mouth daily.     [provider]  Cholecalciferol (VITAMIN D3) 50 MCG  (2000 UT) capsule 1 capsule Orally Once a day    [provider]  Cholecalciferol (VITAMIN D3) 50 MCG (2000 UT) TABS Take 2,000 Units by mouth daily.    [provider]  Cyanocobalamin (VITAMIN B12) 1000 MCG TBCR 1 tablet 03/09/20   [provider]  donepezil  (ARICEPT ) 10 MG tablet TAKE ONE TABLET BY MOUTH ONCE DAILY 05/14/23   Wertman, Sara E, PA-C  Folic Acid  5 MG CAPS Take 5 mg by mouth daily.     [provider]  influenza vaccine adjuvanted (FLUAD  QUADRIVALENT) 0.5 ML injection Inject into the muscle. 12/31/21   Luiz Channel, MD  memantine  (NAMENDA ) 10 MG tablet Take 1 tablet twice a day 10/06/23   Dina Camie BRAVO, PA-C    Physical Exam: Vitals:   12/31/23 1208 12/31/23 1230 12/31/23 1548  BP: (!) 145/51 (!) 150/61 (!) 136/36  Pulse: 67 66 66  Resp: 17 19 18   Temp: 99.8 F (37.7 C)  98.3 F (36.8 C)  TempSrc: Oral  Oral  SpO2: 99% 99% 96%  Weight: 59 kg    Height: 5' 6 (1.676 m)     General exam: awake, drowsy appearing, no acute distress HEENT: atraumatic, clear conjunctiva, anicteric sclera, moist mucus membranes, hearing grossly normal  Respiratory system: CTAB, no wheezes, rales or rhonchi, normal respiratory effort. Cardiovascular system: normal S1/S2, RRR, no JVD, murmurs, rubs, gallops, no pedal edema.   Gastrointestinal system: soft, NT, ND, no HSM felt, +bowel sounds. Central nervous system: A&O x self and hospital. no gross focal neurologic deficits, normal speech Extremities: moves all, no edema, normal tone Skin: dry, intact, normal temperature, normal color, No rashes, lesions or ulcers seen on visualized skin Psychiatry: normal mood, congruent affect, normal judgement and insight due to dementia  Data Reviewed:  As reviewed in detail above ED course  Assessment and Plan:  Altered mental status Generalized weakness In the setting of receiving COVID and flu vaccines yesterday. Medical evaluation in the ED including CBC,  CMP, UA, viral PCRs were all unremarkable.  Patient remains more confused than at her baseline. Suspect patient is having side effects of vaccinations, including low-grade fever with temp 99.8 -- Admit to MedSurg for observation overnight -- Will check TSH, ammonia level and pCO2 for additional medical evaluation -- PT and OT evaluations tomorrow -- Delirium precautions --Patient was given 1 L bolus fluids, will hold further fluids at this time   Hypertension --BP fairly well-controlled with soft DBP's -- Hold home amlodipine for now, resume when appropriate  CKD stage IIIa -renal function stable --Monitor BMP and renally dose meds, avoid nephrotoxins  Dementia --no reports of behavioral disturbances -- Delirium precautions -- Resume home Namenda  and Aricept  pending med history    Advance Care Planning: CODE STATUS-full code Discussed with both daughters at bedside during admission encounter.  They are not certain of advance directive or durable DNR but will look into this.  They  advise patient be full CODE STATUS at this time.  Consults: None  Family Communication: 2 daughters at bedside during encounter  Severity of Illness: The appropriate patient status for this patient is OBSERVATION. Observation status is judged to be reasonable and necessary in order to provide the required intensity of service to ensure the patient's safety. The patient's presenting symptoms, physical exam findings, and initial radiographic and laboratory data in the context of their medical condition is felt to place them at decreased risk for further clinical deterioration. Furthermore, it is anticipated that the patient will be medically stable for discharge from the hospital within 2 midnights of admission.   Author: Burnard DELENA Cunning, DO 12/31/2023 5:29 PM  For on call review [email or web address].

## 2023-12-31 NOTE — ED Triage Notes (Signed)
 According to [facility]: Pt is coming from home, today pt awoke weak and lethargic. Family says she was falling asleep and seemed altered. Pt was abale to ansewr all ems questions. Pt denies pain or other complaints.  Vitals: 154/50 Hr 70 96 spo2 on room air CBG 139  20 piv in left ac

## 2023-12-31 NOTE — ED Provider Notes (Signed)
 CT head negative.  Patient appears improved, no meningeal signs, denies headache and she is answering questions appropriately.  However she does appear globally weak  Daughters confirm the patient had both flu and COVID-vaccine yesterday.  This could be contribute to her symptoms She is not at her baseline  Discussed with Dr. Fausto for admission to the hospitalist   Midge Golas, MD 12/31/23 1744

## 2024-01-01 ENCOUNTER — Encounter (HOSPITAL_COMMUNITY): Payer: Self-pay | Admitting: Internal Medicine

## 2024-01-01 DIAGNOSIS — R4182 Altered mental status, unspecified: Secondary | ICD-10-CM | POA: Diagnosis not present

## 2024-01-01 MED ORDER — MEMANTINE HCL 10 MG PO TABS
10.0000 mg | ORAL_TABLET | Freq: Two times a day (BID) | ORAL | Status: DC
  Administered 2024-01-01: 10 mg via ORAL
  Filled 2024-01-01 (×2): qty 1

## 2024-01-01 MED ORDER — AMLODIPINE BESYLATE 5 MG PO TABS
5.0000 mg | ORAL_TABLET | Freq: Every day | ORAL | Status: DC
  Administered 2024-01-01: 5 mg via ORAL
  Filled 2024-01-01: qty 1

## 2024-01-01 MED ORDER — METHOTREXATE SODIUM 2.5 MG PO TABS: 10.0000 mg | ORAL_TABLET | ORAL | Status: DC

## 2024-01-01 MED ORDER — DONEPEZIL HCL 10 MG PO TABS
10.0000 mg | ORAL_TABLET | Freq: Every day | ORAL | Status: DC
Start: 2024-01-01 — End: 2024-01-01
  Administered 2024-01-01: 10 mg via ORAL
  Filled 2024-01-01: qty 1

## 2024-01-01 NOTE — Evaluation (Signed)
 Occupational Therapy Evaluation Patient Details Name: Shannon Mcintyre MRN: [id number] DOB: 09-Jun-1932 Today's Date: 01/01/2024   History of Present Illness   Shannon Mcintyre is a 89 y.o. female who presented to [facility] 12/31/23 with AMS and  generalized weakness. Family report patient received both flu and COVID vaccines 9/30. CT head negative. PMHx: dementia, HTN, CKD stage IIIa, and GERD.     Clinical Impressions Pt presents with decline in function and safety with ADLs and ADL mobility with impaired strength, balance and endurance; pt with hx of dementia. PTA pt lives at [facility] with her husband and and aides assists with dressing, bathing, and toileting. Daughter reports pt can participate some with ADLs, medications come in a bubble pack that aides delievers, meals are provided by ILF and aide brings them to the apartment, uses rollater for mobility. Pt currently requires CGA with LB ADLs, grooming/hygiene standing at sink and with mobility/transfer using RW. OT will follow acutely to maximize level of function and safety     If plan is discharge home, recommend the following:   A little help with bathing/dressing/bathroom;A little help with walking and/or transfers;Supervision due to cognitive status;Help with stairs or ramp for entrance;Assist for transportation     Functional Status Assessment   Patient has had a recent decline in their functional status and demonstrates the ability to make significant improvements in function in a reasonable and predictable amount of time.     Equipment Recommendations   None recommended by OT     Recommendations for Other Services         Precautions/Restrictions   Precautions Precautions: Fall Recall of Precautions/Restrictions: Intact Restrictions Weight Bearing Restrictions Per Provider Order: No     Mobility Bed Mobility Overal bed mobility: Needs Assistance Bed Mobility: Supine to Sit, Sit to Supine      Supine to sit: Supervision Sit to supine: Supervision        Transfers Overall transfer level: Needs assistance Equipment used: Rolling walker (2 wheels) Transfers: Sit to/from Stand Sit to Stand: Contact guard assist                  Balance Overall balance assessment: Needs assistance Sitting-balance support: Bilateral upper extremity supported, Feet supported Sitting balance-Leahy Scale: Good     Standing balance support: Bilateral upper extremity supported, During functional activity, Reliant on assistive device for balance Standing balance-Leahy Scale: Poor                             ADL either performed or assessed with clinical judgement   ADL Overall ADL's : Needs assistance/impaired Eating/Feeding: Set up;Independent;Sitting   Grooming: Wash/dry hands;Wash/dry face;Contact guard assist;Standing   Upper Body Bathing: Set up;Supervision/ safety   Lower Body Bathing: Contact guard assist   Upper Body Dressing : Set up;Supervision/safety   Lower Body Dressing: Contact guard assist   Toilet Transfer: Contact guard assist;Ambulation;Rolling walker (2 wheels);Cueing for safety   Toileting- Clothing Manipulation and Hygiene: Contact guard assist;Sit to/from stand       Functional mobility during ADLs: Contact guard assist;Rolling walker (2 wheels);Cueing for safety General ADL Comments: At baseline, aides assists with dressing, bathing, and toileting. Daughter reports pt can participate some     Vision Baseline Vision/History: 1 Wears glasses Ability to See in Adequate Light: 0 Adequate Patient Visual Report: No change from baseline       Perception  Praxis         Pertinent Vitals/Pain Pain Assessment Pain Assessment: No/denies pain Pain Intervention(s): Monitored during session, Repositioned     Extremity/Trunk Assessment Upper Extremity Assessment Upper Extremity Assessment: Generalized weakness;Right hand dominant    Lower Extremity Assessment Lower Extremity Assessment: Defer to PT evaluation   Cervical / Trunk Assessment Cervical / Trunk Assessment: Normal   Communication Communication Communication: Impaired Factors Affecting Communication: Hearing impaired   Cognition Arousal: Alert Behavior During Therapy: WFL for tasks assessed/performed Cognition: History of cognitive impairments                               Following commands: Impaired Following commands impaired: Follows one step commands with increased time     Cueing  General Comments   Cueing Techniques: Verbal cues;Gestural cues      Exercises     Shoulder Instructions      Home Living Family/patient expects to be discharged to:: Other (Comment) ([name]) Living Arrangements: Spouse/significant other Available Help at Discharge: Family;Available PRN/intermittently;Personal care attendant;Available 24 hours/day Type of Home: Apartment Home Access: Level entry     Home Layout: One level     Bathroom Shower/Tub: Producer, television/film/video: Handicapped height     Home Equipment: Rollator (4 wheels);Grab bars - toilet;Grab bars - tub/shower;Shower seat;Hand held shower head          Prior Functioning/Environment Prior Level of Function : Needs assist  Cognitive Assist : Mobility (cognitive);ADLs (cognitive) Mobility (Cognitive): Intermittent cues ADLs (Cognitive): Intermittent cues Physical Assist : ADLs (physical)   ADLs (physical): Bathing;Dressing;Toileting;IADLs Mobility Comments: Ambulates using rollator. Supervision for safety with all mobility. Denies fall history in the past 59mo. ADLs Comments: Aides assists with dressing, bathing, and toileting. Daughter reports pt can participate some, medications come in a bubble pack that aides delievers, eals are provided by [facility] and aide brings them to the apartment    OT Problem List: Decreased strength;Impaired balance  (sitting and/or standing);Decreased activity tolerance;Decreased cognition;Decreased safety awareness   OT Treatment/Interventions: Self-care/ADL training;Patient/family education;Balance training;Therapeutic activities;DME and/or AE instruction      OT Goals(Current goals can be found in the care plan section)   Acute Rehab OT Goals Patient Stated Goal: go home OT Goal Formulation: With patient/family Time For Goal Achievement: 01/15/24 Potential to Achieve Goals: Good   OT Frequency:  Min 2X/week    Co-evaluation              AM-PAC OT 6 Clicks Daily Activity     Outcome Measure Help from another person eating meals?: None Help from another person taking care of personal grooming?: A Little Help from another person toileting, which includes using toliet, bedpan, or urinal?: A Little Help from another person bathing (including washing, rinsing, drying)?: A Little Help from another person to put on and taking off regular upper body clothing?: A Little Help from another person to put on and taking off regular lower body clothing?: A Little 6 Click Score: 19   End of Session Equipment Utilized During Treatment: Gait belt;Rolling walker (2 wheels) Nurse Communication: Mobility status  Activity Tolerance: Patient tolerated treatment well Patient left: in bed;with call bell/phone within reach;with bed alarm set;with family/visitor present  OT Visit Diagnosis: Unsteadiness on feet (R26.81);Other abnormalities of gait and mobility (R26.89);Muscle weakness (generalized) (M62.81)                Time: 1007-1030 OT Time Calculation (  min): 23 min Charges:  OT General Charges $OT Visit: 1 Visit OT Evaluation $OT Eval Low Complexity: 1 Low OT Treatments $Self Care/Home Management : 8-22 mins    Jacques Karna Loose 01/01/2024, 1:02 PM

## 2024-01-01 NOTE — TOC Initial Note (Addendum)
 Transition of Care (TOC) - Initial/Assessment Note   Spoke to patient, and daughters Adrien and Alfonso at bedside.   Patient from [facility] and has 24/7 caregivers.   PT recommending HHPT no DME. OT to see patient shortly   In agreement for HHPT. NCM printed Medicare.gov list . Patient and family would like to use PT/OT at [facility]. NCM called [facility] spoke to [place] SW was transferred to rehab department . NCM left message await call back. Will need orders and face to face to fax.   [phone] Called rehab at [name] again no answer , left voicemail.   Called [place] SW at [facility] , no answer , left a voicemail  Harden Moats from rehab department at [facility] returned call. Referral faxed to [phone]. NCM asked Mr Moats if he does not receive the fax to call NCM back at [phone] . Fax sent. Fax machine verified fax went through  Patient Details  Name: Shannon Mcintyre MRN: [id number] Date of Birth: Mar 27, 1933  Transition of Care [facility]) CM/SW Contact:    Stephane Powell Jansky, RN Phone Number: 01/01/2024, 10:05 AM  Clinical Narrative:                   Expected Discharge Plan: Home w Home Health Services Barriers to Discharge: Continued Medical Work up   Patient Goals and CMS Choice Patient states their goals for this hospitalization and ongoing recovery are:: to return to home CMS Medicare.gov Compare Post Acute Care list provided to:: Patient Represenative (must comment) (Daughters) Choice offered to / list presented to : Adult Children      Expected Discharge Plan and Services   Discharge Planning Services: CM Consult Post Acute Care Choice: Home Health Living arrangements for the past 2 months: Apartment                 DME Arranged: N/A DME Agency: NA       HH Arranged: PT, OT HH Agency:  (see note)        Prior Living Arrangements/Services Living arrangements for the past 2 months: Apartment Lives with:: Spouse Patient  language and need for interpreter reviewed:: Yes Do you feel safe going back to the place where you live?: Yes      Need for Family Participation in Patient Care: Yes (Comment) Care giver support system in place?: Yes (comment) Current home services: DME Criminal Activity/Legal Involvement Pertinent to Current Situation/Hospitalization: No - Comment as needed  Activities of Daily Living   ADL Screening (condition at time of admission) Independently performs ADLs?: No Does the patient have a NEW difficulty with bathing/dressing/toileting/self-feeding that is expected to last >3 days?: Yes (Initiates electronic notice to provider for possible OT consult) Does the patient have a NEW difficulty with getting in/out of bed, walking, or climbing stairs that is expected to last >3 days?: Yes (Initiates electronic notice to provider for possible PT consult) Does the patient have a NEW difficulty with communication that is expected to last >3 days?: No Is the patient deaf or have difficulty hearing?: No Does the patient have difficulty seeing, even when wearing glasses/contacts?: No Does the patient have difficulty concentrating, remembering, or making decisions?: Yes  Permission Sought/Granted   Permission granted to share information with : Yes, Verbal Permission Granted  Share Information with NAME: daughters Alfonso and Adrien  Permission granted to share info w AGENCY: [facility] , [employer]        Emotional Assessment Appearance:: Appears stated  age Attitude/Demeanor/Rapport: Engaged Affect (typically observed): Appropriate Orientation: : Oriented to Self Alcohol / Substance Use: Not Applicable Psych Involvement: No (comment)  Admission diagnosis:  Delirium [R41.0] AMS (altered mental status) [R41.82] Patient Active Problem List   Diagnosis Date Noted   AMS (altered mental status) 12/31/2023   S/P total knee replacement, left    Closed fracture of left olecranon process  07/28/2018   Primary localized osteoarthritis of right knee    Vocal cord atrophy 11/22/2013   Hyperlipidemia 11/12/2011   Dysphonia 02/18/2011   Eczema 11/05/2010   Essential hypertension 08/01/2009   ARTHRITIS 03/29/2008   DIVERTICULOSIS, COLON 09/17/2006   PCP:  Charlott Dorn LABOR, MD Pharmacy:   [facility] [place], [place] - [address] [place] [address] Phone: [phone] Fax: [phone]     Social Drivers of Health (SDOH) Social History: SDOH Screenings   Food Insecurity: No Food Insecurity (01/01/2024)  Housing: Low Risk  (01/01/2024)  Transportation Needs: No Transportation Needs (01/01/2024)  Utilities: Not At Risk (01/01/2024)  Social Connections: Unknown (01/01/2024)  Recent Concern: Social Connections - Moderately Isolated (01/01/2024)  Tobacco Use: Low Risk  (01/01/2024)   SDOH Interventions:     Readmission Risk Interventions     No data to display

## 2024-01-01 NOTE — Discharge Summary (Addendum)
 Physician Discharge Summary   Patient: Shannon Mcintyre MRN: [id number] DOB: 01/04/1933  Admit date:     12/31/2023  Discharge date: 01/01/2024  Discharge Physician: Burnard DELENA Cunning   PCP: Charlott Dorn DELENA, MD   Recommendations at discharge:    Follow up with PCP in 1-2 weeks Repeat CBC BMP at follow up  Discharge Diagnoses: Active Problems:   * No active hospital problems. *  Principal Problem (Resolved):   AMS (altered mental status)  Hospital Course:  Shannon Mcintyre is a 89 y.o. female with medical history significant of dementia, hypertension, CKD stage IIIa, GERD who presented to the ED for evaluation of altered mental status and generalized weakness.  Patient is poor historian, family at bedside provided history.  They report that patient's caregiver was up with patient to use the bathroom around 2:30 AM and noted patient to have unsteady gait and not acting like herself.  This morning, patient was too weak to sit up and did not want to get out of bed which is apparently very unusual for her.  Patient was disoriented and her own living space, confusing the kitchen with her den when going for breakfast.  Throughout today, patient has been difficult to keep awake.  Family report patient received both flu and COVID vaccines yesterday.  Since then, she has become progressively weak, fatigued and more confused than her baseline   Patient is awake during encounter.  She denies feeling sick, having bodyaches, fevers or chills, abdominal pain or nausea vomiting, diarrhea, cough, congestion, sore throat, headache or pain with urination.   ED course: Temp 99.8, HR 67, RR 17, initial BP 145/51, SpO2 99% on room air.  Labs were obtained including CMP and CBC which were notable only for nonfasting glucose 102, calcium 8.7, albumin 3.4, platelets 138.  Lactic acid was normal twice at 1.3 and 1.2.  Patient tested negative for influenza A/B, RSV and COVID.  UA had no signs of infection.   Chest x-ray and CT head without contrast were nonacute.     Patient was given 1 L bolus normal saline in the ED.  Blood cultures were obtained and hospitalist service was consulted for admission and further evaluation.   10/2 -- pt did well overnight.  She feels well this AM, has no complaints.  Daughter at bedside and reports pt improved today, at her baseline.  Pt is medically stable for discharge home today.  Assessment and Plan:  Altered mental status Generalized weakness In the setting of receiving COVID and flu vaccines yesterday. Medical evaluation in the ED including CBC, CMP, UA, viral PCRs were all unremarkable.  Patient remains more confused than at her baseline. Suspect patient is having side effects of vaccinations, including low-grade fever with temp 99.8 -- Admitted to MedSurg for observation overnight --Clinically improved this  morning, family report pt at her baseline. -- TSH normal, ammonia minimally elevated, normal pCO2  -- PT and OT recommended HH --Patient was given 1 L bolus fluids in the ED      Hypertension --BP fairly well-controlled with soft DBP's -- On amlodipine   CKD stage IIIa -renal function stable --Monitor BMP and renally dose meds, avoid nephrotoxins   Dementia --no reports of behavioral disturbances -- Delirium precautions -- On Namenda  and Aricept         Consultants: None Procedures performed: None Disposition: Home health Diet recommendation:  Cardiac diet DISCHARGE MEDICATION: Allergies as of 01/01/2024   No Known Allergies  Medication List     TAKE these medications    alendronate 70 MG tablet Commonly known as: FOSAMAX Take 70 mg by mouth every Sunday.   amLODipine 5 MG tablet Commonly known as: NORVASC Take 5 mg by mouth every evening.   donepezil  10 MG tablet Commonly known as: ARICEPT  TAKE ONE TABLET BY MOUTH ONCE DAILY   folic acid  1 MG tablet Commonly known as: FOLVITE  Take 1 mg by mouth See admin  instructions. Take 1 tablet (1mg ) by mouth daily, 6 days a week. Do not folic acid  on methotrexate day (Wednesday).   memantine  10 MG tablet Commonly known as: NAMENDA  Take 1 tablet twice a day   methotrexate 2.5 MG tablet Commonly known as: RHEUMATREX Take 10 mg by mouth See admin instructions. Take 4 tablets (10mg ) by mouth in the morning and in the evening on Wednesday only. Total dose of 20mg , once a week.   Vitamin B12 1000 MCG Tbcr Take 1,000 mcg by mouth See admin instructions. Take 1 tablet ( ) by mouth twice a week on Sunday and Friday.   Vitamin D3 50 MCG (2000 UT) capsule Take 2,000 Units by mouth daily.        Discharge Exam: Filed Weights   12/31/23 1208  Weight: 59 kg   General exam: awake, alert, no acute distress HEENT: atraumatic, clear conjunctiva, anicteric sclera, moist mucus membranes, hearing grossly normal  Respiratory system: CTAB, no wheezes, rales or rhonchi, normal respiratory effort. Cardiovascular system: normal S1/S2,  RRR, no JVD, murmurs, rubs, gallops,  no pedal edema.   Gastrointestinal system: soft, NT, ND, no HSM felt, +bowel sounds. Central nervous system: A&O x2+. no gross focal neurologic deficits, normal speech Extremities: moves all , no edema, normal tone Skin: dry, intact, normal temperature, normal color, No rashes, lesions or ulcers Psychiatry: normal mood, congruent affect, judgement and insight appear normal   Condition at discharge: stable  The results of significant diagnostics from this hospitalization (including imaging, microbiology, ancillary and laboratory) are listed below for reference.   Imaging Studies: CT Head Wo Contrast Result Date: 12/31/2023 CLINICAL DATA:  Altered level of consciousness EXAM: CT HEAD WITHOUT CONTRAST TECHNIQUE: Contiguous axial images were obtained from the base of the skull through the vertex without intravenous contrast. RADIATION DOSE REDUCTION: This exam was performed according to the  departmental dose-optimization program which includes automated exposure control, adjustment of the mA and/or kV according to patient size and/or use of iterative reconstruction technique. COMPARISON:  03/22/2020 FINDINGS: Brain: No evidence of acute infarct or hemorrhage. Diffuse cerebral cortical atrophy is again noted. Lateral ventricles are dilated and stable, slightly out of proportion to the degree of cerebral atrophy, and normal pressure hydrocephalus cannot be excluded. Remaining midline structures are unremarkable. No acute extra-axial fluid collections. No mass effect. Vascular: No hyperdense vessel or unexpected calcification. Skull: Normal. Negative for fracture or focal lesion. Sinuses/Orbits: Fluid within the right middle ear with small right mastoid effusion again noted. Remaining paranasal sinuses are clear. Other: None. IMPRESSION: 1. No acute infarct or hemorrhage. 2. Stable dilatation of the lateral ventricles out of proportion to the degree of cerebral atrophy, which may reflect normal pressure hydrocephalus. 3. Minimal fluid within the right middle ear consistent with otitis media. Small right mastoid effusion. Electronically Signed   By: Ozell Daring M.D.   On: 12/31/2023 16:26   DG Chest Port 1 View Result Date: 12/31/2023 CLINICAL DATA:  Weakness, sepsis. EXAM: PORTABLE CHEST 1 VIEW COMPARISON:  May 16, 2022. FINDINGS: The  heart size and mediastinal contours are within normal limits. Right lung is clear. Minimal left basilar subsegmental atelectasis or scarring is noted. The visualized skeletal structures are unremarkable. IMPRESSION: Minimal left basilar subsegmental atelectasis or scarring. Electronically Signed   By: Lynwood Landy Raddle M.D.   On: 12/31/2023 12:47    Microbiology: Results for orders placed or performed during the hospital encounter of 12/31/23  Resp panel by RT-PCR (RSV, Flu A&B, Covid) Anterior Nasal Swab     Status: None   Collection Time: 12/31/23 12:16 PM    Specimen: Anterior Nasal Swab  Result Value Ref Range Status   SARS Coronavirus 2 by RT PCR NEGATIVE NEGATIVE Final   Influenza A by PCR NEGATIVE NEGATIVE Final   Influenza B by PCR NEGATIVE NEGATIVE Final    Comment: (NOTE) The Xpert Xpress SARS-CoV-2/FLU/RSV plus assay is intended as an aid in the diagnosis of influenza from Nasopharyngeal swab specimens and should not be used as a sole basis for treatment. Nasal washings and aspirates are unacceptable for Xpert Xpress SARS-CoV-2/FLU/RSV testing.  Fact Sheet for Patients: BloggerCourse.com  Fact Sheet for Healthcare Providers: SeriousBroker.it  This test is not yet approved or cleared by the [place]  FDA and has been authorized for detection and/or diagnosis of SARS-CoV-2 by FDA under an Emergency Use Authorization (EUA). This EUA will remain in effect (meaning this test can be used) for the duration of the COVID-19 declaration under Section 564(b)(1) of the Act, 21 U.S.C. section 360bbb-3(b)(1), unless the authorization is terminated or revoked.     Resp Syncytial Virus by PCR NEGATIVE NEGATIVE Final    Comment: (NOTE) Fact Sheet for Patients: BloggerCourse.com  Fact Sheet for Healthcare Providers: SeriousBroker.it  This test is not yet approved or cleared by the [place]  FDA and has been authorized for detection and/or diagnosis of SARS-CoV-2 by FDA under an Emergency Use Authorization (EUA). This EUA will remain in effect (meaning this test can be used) for the duration of the COVID-19 declaration under Section 564(b)(1) of the Act, 21 U.S.C. section 360bbb-3(b)(1), unless the authorization is terminated or revoked.  Performed at [facility], [address], [place], [place] [address]   Blood Culture (routine x 2)     Status: None (Preliminary result)   Collection Time: 12/31/23  1:43 PM    Specimen: BLOOD LEFT HAND  Result Value Ref Range Status   Specimen Description BLOOD LEFT HAND  Final   Special Requests   Final    AEROBIC BOTTLE ONLY Blood Culture results may not be optimal due to an inadequate volume of blood received in culture bottles   Culture   Final    NO GROWTH 3 DAYS Performed at [facility], [address] [place], [place] [address]    Report Status PENDING  Incomplete  Blood Culture (routine x 2)     Status: None (Preliminary result)   Collection Time: 12/31/23  3:29 PM   Specimen: BLOOD  Result Value Ref Range Status   Specimen Description BLOOD RIGHT ANTECUBITAL  Final   Special Requests   Final    BOTTLES DRAWN AEROBIC AND ANAEROBIC Blood Culture adequate volume   Culture   Final    NO GROWTH 3 DAYS Performed at [facility], [address], [place], [place] [address]    Report Status PENDING  Incomplete    Labs: CBC: Recent Labs  Lab 12/31/23 1418  WBC 6.9  NEUTROABS 5.4  HGB 12.8  HCT 36.7  MCV 92.9  PLT 138*   Basic Metabolic Panel: Recent Labs  Lab 12/31/23 1418  NA 139  K 4.0  CL 104  CO2 26  GLUCOSE 102*  BUN 12  CREATININE 0.82  CALCIUM 8.7*   Liver Function Tests: Recent Labs  Lab 12/31/23 1418  AST 28  ALT 30  ALKPHOS 54  BILITOT 0.6  PROT 5.9*  ALBUMIN 3.4*   CBG: Recent Labs  Lab 12/31/23 1207  GLUCAP 94    Discharge time spent: less than 30 minutes.  Signed: Burnard DELENA Cunning, DO [facility] 01/03/2024

## 2024-01-01 NOTE — Evaluation (Signed)
 Physical Therapy Evaluation Patient Details Name: Shannon Mcintyre MRN: [id number] DOB: [birth year] Today's Date: 01/01/2024  History of Present Illness  Shannon Mcintyre is a 89 y.o. female who presented to [facility] 12/31/23 with AMS and  generalized weakness. Family report patient received both flu and COVID vaccines 9/30. CT head negative. PMHx: dementia, HTN, CKD stage IIIa, and GERD.   Clinical Impression  Pt admitted with above diagnosis. PTA, pt ambulated using a rollator with supervision and required assist with most ADLs and all IADLs. She lives with her husband at [facility] in an apartment. They have 24/7 caregiver support. Pt currently with functional limitations due to the deficits listed below (see PT Problem List). She performed bed mobility with supervision and required CGA for transfers and gait using RW. Pt is limited by generalized deconditioning and limited activity tolerance. Pt will benefit from acute skilled PT to maximize her independence and safety with mobility to allow discharge. Recommend HHPT to increase strength, improve balance, increase activity tolerance, decrease fall risk, and optimize safety within the home environment.      If plan is discharge home, recommend the following: A little help with walking and/or transfers;A little help with bathing/dressing/bathroom;Assistance with cooking/housework;Assist for transportation;Help with stairs or ramp for entrance;Supervision due to cognitive status   Can travel by private vehicle        Equipment Recommendations None recommended by PT  Recommendations for Other Services       Functional Status Assessment Patient has had a recent decline in their functional status and demonstrates the ability to make significant improvements in function in a reasonable and predictable amount of time.     Precautions / Restrictions Precautions Precautions: Fall Recall of Precautions/Restrictions: Intact Restrictions Weight  Bearing Restrictions Per Provider Order: No      Mobility  Bed Mobility Overal bed mobility: Needs Assistance Bed Mobility: Supine to Sit, Sit to Supine     Supine to sit: Supervision Sit to supine: Supervision   General bed mobility comments: Pt sat up on L side of bed. Flat without use of bedrails.    Transfers Overall transfer level: Needs assistance Equipment used: Rolling walker (2 wheels) Transfers: Sit to/from Stand Sit to Stand: Contact guard assist           General transfer comment: Pt stood from lowest bed height. She demonstrated proper hand placement. Powered up with light assist. Good eccentric control.    Ambulation/Gait Ambulation/Gait assistance: Contact guard assist Gait Distance (Feet): 150 Feet Assistive device: Rolling walker (2 wheels) Gait Pattern/deviations: Step-through pattern, Decreased stride length, Trunk flexed Gait velocity: reduced Gait velocity interpretation: <1.8 ft/sec, indicate of risk for recurrent falls   General Gait Details: Pt ambulated with short slow steps. She took intermittent standing rest breaks, which her daughter reports is typical for her. Pt with fwd lean towards RW, likely because she is used to using rollator which has a seat creating a greater distance from hand support. Pt manuevered within room/hallway well. No LOB.  Stairs            Wheelchair Mobility     Tilt Bed    Modified Rankin (Stroke Patients Only)       Balance Overall balance assessment: Needs assistance Sitting-balance support: Bilateral upper extremity supported, Feet supported Sitting balance-Leahy Scale: Fair Sitting balance - Comments: Pt sat EOB with supervision   Standing balance support: Bilateral upper extremity supported, During functional activity, Reliant on assistive device for balance Standing balance-Leahy Scale: Poor  Standing balance comment: Pt dependent on RW                             Pertinent  Vitals/Pain Pain Assessment Pain Assessment: PAINAD Breathing: normal Negative Vocalization: none Facial Expression: smiling or inexpressive Body Language: relaxed Consolability: no need to console PAINAD Score: 0 Pain Intervention(s): Monitored during session, Repositioned    Home Living Family/patient expects to be discharged to:: Other (Comment) ([facility]) Living Arrangements: Spouse/significant other Available Help at Discharge: Family;Available PRN/intermittently;Personal care attendant;Available 24 hours/day (Daughter reports 24/7 caregivers present with her parents in apartment) Type of Home: Apartment Home Access: Level entry       Home Layout: One level Home Equipment: Rollator (4 wheels);Grab bars - toilet;Grab bars - tub/shower;Shower seat;Hand held shower head      Prior Function Prior Level of Function : Needs assist  Cognitive Assist : Mobility (cognitive);ADLs (cognitive) Mobility (Cognitive): Intermittent cues ADLs (Cognitive): Intermittent cues Physical Assist : ADLs (physical)   ADLs (physical): Bathing;Dressing;Toileting;IADLs Mobility Comments: Ambulates using rollator. Supervision for safety with all mobility. Denies fall history in the past 36mo. ADLs Comments: Pt can take care of her personal care. Aides aid with dressing, bathing, and toileting. Daughter reports pt can participate. Medications come in a bubble pack. Aides deliever meds to pt. Meals are provided by [facility] and aide brings them to the apartment.     Extremity/Trunk Assessment   Upper Extremity Assessment Upper Extremity Assessment: Defer to OT evaluation    Lower Extremity Assessment Lower Extremity Assessment: Generalized weakness    Cervical / Trunk Assessment Cervical / Trunk Assessment: Normal  Communication   Communication Communication: Impaired Factors Affecting Communication: Hearing impaired    Cognition Arousal: Alert Behavior During Therapy: WFL for tasks  assessed/performed   PT - Cognitive impairments: History of cognitive impairments (Dementia)                         Following commands: Impaired Following commands impaired: Follows one step commands with increased time     Cueing Cueing Techniques: Verbal cues, Gestural cues     General Comments      Exercises     Assessment/Plan    PT Assessment Patient needs continued PT services  PT Problem List Decreased strength;Decreased activity tolerance;Decreased balance;Decreased mobility       PT Treatment Interventions DME instruction;Gait training;Functional mobility training;Therapeutic activities;Therapeutic exercise;Balance training;Cognitive remediation;Patient/family education    PT Goals (Current goals can be found in the Care Plan section)  Acute Rehab PT Goals Patient Stated Goal: Return Home and get stronger PT Goal Formulation: With patient/family Time For Goal Achievement: 01/15/24 Potential to Achieve Goals: Fair    Frequency Min 2X/week     Co-evaluation               AM-PAC PT 6 Clicks Mobility  Outcome Measure Help needed turning from your back to your side while in a flat bed without using bedrails?: A Little Help needed moving from lying on your back to sitting on the side of a flat bed without using bedrails?: A Little Help needed moving to and from a bed to a chair (including a wheelchair)?: A Little Help needed standing up from a chair using your arms (e.g., wheelchair or bedside chair)?: A Little Help needed to walk in hospital room?: A Little Help needed climbing 3-5 steps with a railing? : A Lot 6 Click Score: 17  End of Session Equipment Utilized During Treatment: Gait belt Activity Tolerance: Patient tolerated treatment well Patient left: in bed;with call bell/phone within reach;with family/visitor present Nurse Communication: Mobility status PT Visit Diagnosis: Muscle weakness (generalized) (M62.81);Unsteadiness on feet  (R26.81)    Time: 9242-9174 PT Time Calculation (min) (ACUTE ONLY): 28 min   Charges:   PT Evaluation $PT Eval Low Complexity: 1 Low PT Treatments $Gait Training: 8-22 mins PT General Charges $$ ACUTE PT VISIT: 1 Visit         Randall SAUNDERS, PT, DPT Acute Rehabilitation Services Office: [phone] Secure Chat Preferred  Delon CHRISTELLA Callander 01/01/2024, 8:42 AM

## 2024-01-01 NOTE — Care Management Obs Status (Signed)
 MEDICARE OBSERVATION STATUS NOTIFICATION   Patient Details  Name: ALETTA EDMUNDS MRN: [id number] Date of Birth: February 15, 1933   Medicare Observation Status Notification Given:  Yes    Jon Cruel 01/01/2024, 11:42 AM

## 2024-01-03 ENCOUNTER — Encounter (HOSPITAL_COMMUNITY): Payer: Self-pay | Admitting: Internal Medicine

## 2024-01-05 LAB — CULTURE, BLOOD (ROUTINE X 2)
Culture: NO GROWTH
Culture: NO GROWTH
Special Requests: ADEQUATE

## 2024-01-07 DIAGNOSIS — M1990 Unspecified osteoarthritis, unspecified site: Secondary | ICD-10-CM | POA: Diagnosis not present

## 2024-01-07 DIAGNOSIS — M6281 Muscle weakness (generalized): Secondary | ICD-10-CM | POA: Diagnosis not present

## 2024-01-08 DIAGNOSIS — H90A22 Sensorineural hearing loss, unilateral, left ear, with restricted hearing on the contralateral side: Secondary | ICD-10-CM | POA: Diagnosis not present

## 2024-01-08 DIAGNOSIS — H90A31 Mixed conductive and sensorineural hearing loss, unilateral, right ear with restricted hearing on the contralateral side: Secondary | ICD-10-CM | POA: Diagnosis not present

## 2024-01-09 DIAGNOSIS — M1990 Unspecified osteoarthritis, unspecified site: Secondary | ICD-10-CM | POA: Diagnosis not present

## 2024-01-09 DIAGNOSIS — M6281 Muscle weakness (generalized): Secondary | ICD-10-CM | POA: Diagnosis not present

## 2024-01-12 DIAGNOSIS — M6281 Muscle weakness (generalized): Secondary | ICD-10-CM | POA: Diagnosis not present

## 2024-01-12 DIAGNOSIS — M1990 Unspecified osteoarthritis, unspecified site: Secondary | ICD-10-CM | POA: Diagnosis not present

## 2024-01-13 DIAGNOSIS — M1990 Unspecified osteoarthritis, unspecified site: Secondary | ICD-10-CM | POA: Diagnosis not present

## 2024-01-13 DIAGNOSIS — M6281 Muscle weakness (generalized): Secondary | ICD-10-CM | POA: Diagnosis not present

## 2024-01-14 DIAGNOSIS — M6281 Muscle weakness (generalized): Secondary | ICD-10-CM | POA: Diagnosis not present

## 2024-01-14 DIAGNOSIS — M1990 Unspecified osteoarthritis, unspecified site: Secondary | ICD-10-CM | POA: Diagnosis not present

## 2024-01-15 DIAGNOSIS — M1990 Unspecified osteoarthritis, unspecified site: Secondary | ICD-10-CM | POA: Diagnosis not present

## 2024-01-15 DIAGNOSIS — M6281 Muscle weakness (generalized): Secondary | ICD-10-CM | POA: Diagnosis not present

## 2024-01-16 DIAGNOSIS — M1990 Unspecified osteoarthritis, unspecified site: Secondary | ICD-10-CM | POA: Diagnosis not present

## 2024-01-16 DIAGNOSIS — M6281 Muscle weakness (generalized): Secondary | ICD-10-CM | POA: Diagnosis not present

## 2024-01-19 ENCOUNTER — Emergency Department (HOSPITAL_COMMUNITY)
Admission: EM | Admit: 2024-01-19 | Discharge: 2024-01-19 | Disposition: A | Discharge status: Home / Self Care | Attending: Emergency Medicine | Admitting: Emergency Medicine

## 2024-01-19 ENCOUNTER — Emergency Department (HOSPITAL_COMMUNITY)

## 2024-01-19 ENCOUNTER — Other Ambulatory Visit: Payer: Self-pay

## 2024-01-19 DIAGNOSIS — R410 Disorientation, unspecified: Secondary | ICD-10-CM | POA: Diagnosis not present

## 2024-01-19 DIAGNOSIS — G319 Degenerative disease of nervous system, unspecified: Secondary | ICD-10-CM | POA: Diagnosis not present

## 2024-01-19 DIAGNOSIS — F0390 Unspecified dementia without behavioral disturbance: Secondary | ICD-10-CM | POA: Insufficient documentation

## 2024-01-19 DIAGNOSIS — R799 Abnormal finding of blood chemistry, unspecified: Secondary | ICD-10-CM | POA: Diagnosis not present

## 2024-01-19 DIAGNOSIS — R4182 Altered mental status, unspecified: Secondary | ICD-10-CM | POA: Insufficient documentation

## 2024-01-19 DIAGNOSIS — R404 Transient alteration of awareness: Secondary | ICD-10-CM | POA: Diagnosis not present

## 2024-01-19 DIAGNOSIS — I10 Essential (primary) hypertension: Secondary | ICD-10-CM | POA: Diagnosis not present

## 2024-01-19 DIAGNOSIS — I517 Cardiomegaly: Secondary | ICD-10-CM | POA: Insufficient documentation

## 2024-01-19 DIAGNOSIS — R918 Other nonspecific abnormal finding of lung field: Secondary | ICD-10-CM | POA: Diagnosis not present

## 2024-01-19 DIAGNOSIS — R531 Weakness: Secondary | ICD-10-CM | POA: Diagnosis not present

## 2024-01-19 DIAGNOSIS — G9389 Other specified disorders of brain: Secondary | ICD-10-CM | POA: Diagnosis not present

## 2024-01-19 DIAGNOSIS — R262 Difficulty in walking, not elsewhere classified: Secondary | ICD-10-CM | POA: Diagnosis not present

## 2024-01-19 LAB — CBC
HCT: 38.7 % (ref 36.0–46.0)
Hemoglobin: 13.4 g/dL (ref 12.0–15.0)
MCH: 32.3 pg (ref 26.0–34.0)
MCHC: 34.6 g/dL (ref 30.0–36.0)
MCV: 93.3 fL (ref 80.0–100.0)
Platelets: 182 K/uL (ref 150–400)
RBC: 4.15 MIL/uL (ref 3.87–5.11)
RDW: 13.1 % (ref 11.5–15.5)
WBC: 7.3 K/uL (ref 4.0–10.5)
nRBC: 0 % (ref 0.0–0.2)

## 2024-01-19 LAB — COMPREHENSIVE METABOLIC PANEL WITH GFR
ALT: 24 U/L (ref 0–44)
AST: 30 U/L (ref 15–41)
Albumin: 3.6 g/dL (ref 3.5–5.0)
Alkaline Phosphatase: 56 U/L (ref 38–126)
Anion gap: 11 (ref 5–15)
BUN: 14 mg/dL (ref 8–23)
CO2: 23 mmol/L (ref 22–32)
Calcium: 8.7 mg/dL — ABNORMAL LOW (ref 8.9–10.3)
Chloride: 103 mmol/L (ref 98–111)
Creatinine, Ser: 0.95 mg/dL (ref 0.44–1.00)
GFR, Estimated: 57 mL/min — ABNORMAL LOW (ref >60)
Glucose, Bld: 98 mg/dL (ref 70–99)
Potassium: 4.2 mmol/L (ref 3.5–5.1)
Sodium: 137 mmol/L (ref 135–145)
Total Bilirubin: 1.2 mg/dL (ref 0.0–1.2)
Total Protein: 6.6 g/dL (ref 6.5–8.1)

## 2024-01-19 LAB — RESP PANEL BY RT-PCR (RSV, FLU A&B, COVID)  RVPGX2
Influenza A by PCR: NEGATIVE
Influenza B by PCR: NEGATIVE
Resp Syncytial Virus by PCR: NEGATIVE
SARS Coronavirus 2 by RT PCR: NEGATIVE

## 2024-01-19 LAB — URINALYSIS, W/ REFLEX TO CULTURE (INFECTION SUSPECTED)
Bacteria, UA: NONE SEEN
Bilirubin Urine: NEGATIVE
Glucose, UA: NEGATIVE mg/dL
Hgb urine dipstick: NEGATIVE
Ketones, ur: 5 mg/dL — AB
Leukocytes,Ua: NEGATIVE
Nitrite: NEGATIVE
Protein, ur: 30 mg/dL — AB
Specific Gravity, Urine: 1.018 (ref 1.005–1.030)
pH: 8 (ref 5.0–8.0)

## 2024-01-19 LAB — PROTIME-INR
INR: 1.1 (ref 0.8–1.2)
Prothrombin Time: 14.3 s (ref 11.4–15.2)

## 2024-01-19 LAB — CBG MONITORING, ED: Glucose-Capillary: 92 mg/dL (ref 70–99)

## 2024-01-19 LAB — I-STAT CG4 LACTIC ACID, ED: Lactic Acid, Venous: 1.4 mmol/L (ref 0.5–1.9)

## 2024-01-19 MED ORDER — AMOXICILLIN-POT CLAVULANATE 875-125 MG PO TABS: 1.0000 | ORAL_TABLET | Freq: Two times a day (BID) | ORAL | 0 refills | Status: DC

## 2024-01-19 NOTE — ED Triage Notes (Signed)
 [facility] arrives via [facility] for [facility]MS from [facility]. Patient lives with her husband, daughters were at her home and noticed abnormal gait and AMS. Abnormal gait with AMS since 0200 this am. Patient has no complaint. Hx of dementia with increased confusion. Poor PO intake. Patient was ambulating with EMS and does lean to the left. Denies sensation issues. Per family, confirmed LKW 0200.   EMS vitals BP 150/118 HR 60 RR 18 98 on room air Temp 97.9 CBG 105

## 2024-01-19 NOTE — ED Provider Notes (Signed)
 Patient is an 89 year old female who lives in independent living and is presenting today after an episode of being confused and is now back to her baseline.  Labs thus far reassuring but waiting on urine and COVID.  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  Chest x-ray with concern for possible viral etiology.  Sats are 98% on room air.  Patient's COVID test is negative and UA is negative for infection.  I went to discuss the findings with the patient and her daughter.  At this time it seems that patient is stable for discharge home.  She does have 24-hour care in her home.  Did discuss with them x-ray findings concerning for possible a viral illness and they do states she has been coughing more today.  Will give her prescription for an antibiotic to hold if she starts running a fever or having productive cough.   Doretha Folks, MD 01/19/24 1900

## 2024-01-19 NOTE — ED Provider Notes (Signed)
 [place] EMERGENCY DEPARTMENT AT [facility] Provider Note   CSN: [id number] Arrival date & time: 01/19/24  1259     Patient presents with: Altered Mental Status   Shannon Mcintyre is a 89 y.o. female.   HPI Presents with her daughter who assists with the history. Patient lives in independent living, though with 24/7 caregivers. Today she had an episode of unresponsiveness, reportedly staring off into space, in a manner atypical for her.  Patient is known to have some dementia, but is typically awake, alert, interactive.  Patient had a similar episode 2 weeks ago, was seen, evaluated, observed overnight. No recent medication change, diet change, activity change.  No fall today. Patient herself denies any pain, complaints, is agreeable to evaluation.    Prior to Admission medications   Medication Sig Start Date End Date Taking? Authorizing Provider  alendronate (FOSAMAX) 70 MG tablet Take 70 mg by mouth every Sunday.    [provider]  amLODipine (NORVASC) 5 MG tablet Take 5 mg by mouth every evening.    [provider]  Cholecalciferol (VITAMIN D3) 50 MCG (2000 UT) capsule Take 2,000 Units by mouth daily.    [provider]  Cyanocobalamin (VITAMIN B12) 1000 MCG TBCR Take 1,000 mcg by mouth See admin instructions. Take 1 tablet ( ) by mouth twice a week on Sunday and Friday. 03/09/20   [provider]  donepezil  (ARICEPT ) 10 MG tablet TAKE ONE TABLET BY MOUTH ONCE DAILY 05/14/23   Wertman, Sara E, PA-C  folic acid  (FOLVITE ) 1 MG tablet Take 1 mg by mouth See admin instructions. Take 1 tablet (1mg ) by mouth daily, 6 days a week. Do not folic acid  on methotrexate day (Wednesday).    [provider]  memantine  (NAMENDA ) 10 MG tablet Take 1 tablet twice a day 10/06/23   Wertman, Sara E, PA-C  methotrexate (RHEUMATREX) 2.5 MG tablet Take 10 mg by mouth See admin instructions. Take 4 tablets (10mg ) by mouth in the morning and in  the evening on Wednesday only. Total dose of 20mg , once a week.    [provider]    Allergies: Patient has no known allergies.    Review of Systems  Updated Vital Signs BP (!) 163/108 (BP Location: Left Arm)   Pulse 72   Temp 98.2 F (36.8 C)   Resp 16   Ht 1.676 m (5' 6)   Wt 60 kg   SpO2 98%   BMI 21.34 kg/m   Physical Exam Vitals and nursing note reviewed.  Constitutional:      General: She is not in acute distress.    Appearance: She is well-developed.  HENT:     Head: Normocephalic and atraumatic.  Eyes:     Conjunctiva/sclera: Conjunctivae normal.  Cardiovascular:     Rate and Rhythm: Normal rate and regular rhythm.     Pulses: Normal pulses.  Pulmonary:     Effort: Pulmonary effort is normal. No respiratory distress.     Breath sounds: No stridor.  Abdominal:     General: There is no distension.  Skin:    General: Skin is warm and dry.  Neurological:     Mental Status: She is alert.     Cranial Nerves: No cranial nerve deficit.  Psychiatric:        Behavior: Behavior is withdrawn.        Cognition and Memory: Cognition is impaired. Memory is impaired.     (all labs ordered are listed, but  only abnormal results are displayed) Labs Reviewed  COMPREHENSIVE METABOLIC PANEL WITH GFR - Abnormal; Notable for the following components:      Result Value   Calcium 8.7 (*)    GFR, Estimated 57 (*)    All other components within normal limits  CULTURE, BLOOD (ROUTINE X 2)  CULTURE, BLOOD (ROUTINE X 2)  RESP PANEL BY RT-PCR (RSV, FLU A&B, COVID)  RVPGX2  CBC  PROTIME-INR  URINALYSIS, W/ REFLEX TO CULTURE (INFECTION SUSPECTED)  CBG MONITORING, ED  I-STAT CG4 LACTIC ACID, ED    EKG: EKG Interpretation Date/Time:  Monday January 19 2024 13:23:35 EDT Ventricular Rate:  67 PR Interval:  148 QRS Duration:  80 QT Interval:  404 QTC Calculation: 426 R Axis:   71  Text Interpretation: Normal sinus rhythm Nonspecific ST and T wave abnormality  Abnormal ECG Confirmed by Garrick Charleston [phone]) on 01/19/2024 2:10:44 PM  Radiology: CT Head Wo Contrast Result Date: 01/19/2024 CLINICAL DATA:  Altered level of consciousness EXAM: CT HEAD WITHOUT CONTRAST TECHNIQUE: Contiguous axial images were obtained from the base of the skull through the vertex without intravenous contrast. RADIATION DOSE REDUCTION: This exam was performed according to the departmental dose-optimization program which includes automated exposure control, adjustment of the mA and/or kV according to patient size and/or use of iterative reconstruction technique. COMPARISON:  12/31/2023 FINDINGS: Brain: No evidence of acute infarct or hemorrhage. Stable dilation of the lateral ventricles out of proportion to the degree of cerebral atrophy, which may reflect normal pressure hydrocephalus. Remaining midline structures are unremarkable. No acute extra-axial fluid collections. No mass effect. Vascular: No hyperdense vessel or unexpected calcification. Skull: Normal. Negative for fracture or focal lesion. Sinuses/Orbits: Mucosal thickening within the ethmoid air cells. Trace fluid within the left maxillary sinus. Stable right mastoid effusion. Other: None. IMPRESSION: 1. No acute infarct or hemorrhage. 2. Continued prominence of the lateral ventricles out of proportion to the degree of cerebral atrophy, which may reflect normal pressure hydrocephalus. 3. Mild ethmoid and maxillary sinus disease, new since prior study. Electronically Signed   By: Ozell Daring M.D.   On: 01/19/2024 15:41   DG Chest Port 1 View Result Date: 01/19/2024 CLINICAL DATA:  Questionable sepsis, altered mental status EXAM: PORTABLE CHEST 1 VIEW COMPARISON:  12/31/2023 FINDINGS: Cardiomegaly. Mild diffuse bilateral interstitial opacity. Left retrocardiac atelectasis and or small pleural effusion. The visualized skeletal structures are unremarkable. IMPRESSION: 1. Cardiomegaly with mild diffuse bilateral interstitial  opacity, consistent with edema or atypical/viral infection. 2. Left retrocardiac atelectasis and or small pleural effusion. Electronically Signed   By: Marolyn JONETTA Jaksch M.D.   On: 01/19/2024 14:29     Procedures   Medications Ordered in the ED - No data to display                                  Medical Decision Making Elderly female presents after an episode of altered mental status that occurred earlier in the day.  Patient is awake, alert, denies complaints, is afebrile, hemodynamically unremarkable.  Broad differential including TIA, progression of dementia, electrolyte abnormalities, infection/bacteremia/sepsis/arrhythmia. Cardiac 75 sinus normal pulse ox 98% room air normal  Amount and/or Complexity of Data Reviewed Independent Historian:     Details: Adult child External Data Reviewed: notes. Labs: ordered. Decision-making details documented in ED Course. Radiology: ordered and independent interpretation performed. Decision-making details documented in ED Course. ECG/medicine tests: ordered and independent interpretation performed. Decision-making details  documented in ED Course.  Risk Prescription drug management. Decision regarding hospitalization. Diagnosis or treatment significantly limited by social determinants of health.   4:04 PM On repeat exam after reviewing head CT which is unremarkable aside from stable changes possible NPH, no bleed, no mass, no fracture. Initial labs reassuring, patient awaiting urinalysis, COVID test on signout.   Final diagnoses:  Confusion     Garrick Charleston, MD 01/19/24 [phone]

## 2024-01-19 NOTE — ED Notes (Signed)
 Pt discharged with daughter by wheelchair.

## 2024-01-19 NOTE — Discharge Instructions (Signed)
 All the blood work looks normal and no sign of urinary tract infection or COVID.  On your x-ray it did show that you may be developing a virus but no signs of pneumonia at this time.  You can hold onto the antibiotic prescription and fill it if you start having fever or a lot of mucus with your cough that is different than your baseline.

## 2024-01-20 ENCOUNTER — Telehealth (HOSPITAL_COMMUNITY): Payer: Self-pay

## 2024-01-20 ENCOUNTER — Encounter (HOSPITAL_COMMUNITY): Payer: Self-pay

## 2024-01-20 ENCOUNTER — Emergency Department (HOSPITAL_COMMUNITY)

## 2024-01-20 ENCOUNTER — Other Ambulatory Visit: Payer: Self-pay

## 2024-01-20 ENCOUNTER — Emergency Department (HOSPITAL_COMMUNITY)
Admission: EM | Admit: 2024-01-20 | Discharge: 2024-01-21 | Disposition: A | Discharge status: Home / Self Care | Attending: Emergency Medicine | Admitting: Emergency Medicine

## 2024-01-20 DIAGNOSIS — I10 Essential (primary) hypertension: Secondary | ICD-10-CM | POA: Diagnosis not present

## 2024-01-20 DIAGNOSIS — M6281 Muscle weakness (generalized): Secondary | ICD-10-CM | POA: Diagnosis not present

## 2024-01-20 DIAGNOSIS — G9389 Other specified disorders of brain: Secondary | ICD-10-CM | POA: Diagnosis not present

## 2024-01-20 DIAGNOSIS — R509 Fever, unspecified: Secondary | ICD-10-CM | POA: Diagnosis not present

## 2024-01-20 DIAGNOSIS — I517 Cardiomegaly: Secondary | ICD-10-CM | POA: Diagnosis not present

## 2024-01-20 DIAGNOSIS — M1990 Unspecified osteoarthritis, unspecified site: Secondary | ICD-10-CM | POA: Diagnosis not present

## 2024-01-20 DIAGNOSIS — R059 Cough, unspecified: Secondary | ICD-10-CM | POA: Diagnosis not present

## 2024-01-20 DIAGNOSIS — R051 Acute cough: Secondary | ICD-10-CM | POA: Insufficient documentation

## 2024-01-20 DIAGNOSIS — R7881 Bacteremia: Secondary | ICD-10-CM | POA: Diagnosis not present

## 2024-01-20 DIAGNOSIS — R410 Disorientation, unspecified: Secondary | ICD-10-CM | POA: Diagnosis not present

## 2024-01-20 DIAGNOSIS — R404 Transient alteration of awareness: Secondary | ICD-10-CM | POA: Diagnosis not present

## 2024-01-20 DIAGNOSIS — R918 Other nonspecific abnormal finding of lung field: Secondary | ICD-10-CM | POA: Diagnosis not present

## 2024-01-20 LAB — BLOOD CULTURE ID PANEL (REFLEXED) - BCID2

## 2024-01-20 LAB — COMPREHENSIVE METABOLIC PANEL WITH GFR
ALT: 21 U/L (ref 0–44)
AST: 26 U/L (ref 15–41)
Albumin: 3.7 g/dL (ref 3.5–5.0)
Alkaline Phosphatase: 61 U/L (ref 38–126)
Anion gap: 9 (ref 5–15)
BUN: 19 mg/dL (ref 8–23)
CO2: 24 mmol/L (ref 22–32)
Calcium: 9 mg/dL (ref 8.9–10.3)
Chloride: 105 mmol/L (ref 98–111)
Creatinine, Ser: 0.94 mg/dL (ref 0.44–1.00)
GFR, Estimated: 58 mL/min — ABNORMAL LOW (ref >60)
Glucose, Bld: 90 mg/dL (ref 70–99)
Potassium: 4 mmol/L (ref 3.5–5.1)
Sodium: 138 mmol/L (ref 135–145)
Total Bilirubin: 0.9 mg/dL (ref 0.0–1.2)
Total Protein: 6.8 g/dL (ref 6.5–8.1)

## 2024-01-20 LAB — CBC WITH DIFFERENTIAL/PLATELET
Abs Immature Granulocytes: 0.03 K/uL (ref 0.00–0.07)
Basophils Absolute: 0 K/uL (ref 0.0–0.1)
Basophils Relative: 0 %
Eosinophils Absolute: 0.2 K/uL (ref 0.0–0.5)
Eosinophils Relative: 2 %
HCT: 38.4 % (ref 36.0–46.0)
Hemoglobin: 13.2 g/dL (ref 12.0–15.0)
Immature Granulocytes: 0 %
Lymphocytes Relative: 25 %
Lymphs Abs: 1.9 K/uL (ref 0.7–4.0)
MCH: 32 pg (ref 26.0–34.0)
MCHC: 34.4 g/dL (ref 30.0–36.0)
MCV: 93.2 fL (ref 80.0–100.0)
Monocytes Absolute: 1.2 K/uL — ABNORMAL HIGH (ref 0.1–1.0)
Monocytes Relative: 16 %
Neutro Abs: 4.4 K/uL (ref 1.7–7.7)
Neutrophils Relative %: 57 %
Platelets: 152 K/uL (ref 150–400)
RBC: 4.12 MIL/uL (ref 3.87–5.11)
RDW: 13.7 % (ref 11.5–15.5)
WBC: 7.8 K/uL (ref 4.0–10.5)
nRBC: 0 % (ref 0.0–0.2)

## 2024-01-20 LAB — I-STAT CHEM 8, ED
BUN: 22 mg/dL (ref 8–23)
Calcium, Ion: 1.11 mmol/L — ABNORMAL LOW (ref 1.15–1.40)
Chloride: 105 mmol/L (ref 98–111)
Creatinine, Ser: 0.9 mg/dL (ref 0.44–1.00)
Glucose, Bld: 90 mg/dL (ref 70–99)
HCT: 38 % (ref 36.0–46.0)
Hemoglobin: 12.9 g/dL (ref 12.0–15.0)
Potassium: 4 mmol/L (ref 3.5–5.1)
Sodium: 140 mmol/L (ref 135–145)
TCO2: 25 mmol/L (ref 22–32)

## 2024-01-20 LAB — I-STAT CG4 LACTIC ACID, ED: Lactic Acid, Venous: 0.9 mmol/L (ref 0.5–1.9)

## 2024-01-20 MED ORDER — DOXYCYCLINE HYCLATE 100 MG PO CAPS: 100.0000 mg | ORAL_CAPSULE | Freq: Two times a day (BID) | ORAL | 0 refills | Status: DC

## 2024-01-20 NOTE — ED Triage Notes (Signed)
 Pt arrives with daughters from [facility] - seen yesterday for viral respiratory concerns and called to return today for +blood cxs. Pt started her first dose of abx today

## 2024-01-20 NOTE — Progress Notes (Signed)
 PHARMACY - PHYSICIAN COMMUNICATION - BLOOD CULTURE IDENTIFICATION (BCID)  Shannon Mcintyre is an 89 y.o. female who presented to [facility] on 01/20/2024 with a chief complaint of respiratory symptoms and positive blood culture result.   Assessment: Blood cultures drawn on 10/20 showed 1 of 4 bottles positive for Staphylococcus species, specific species not identified. Only the aerobic bottle in 1 of 2 sets was positive. Per RN note, patient's husband reported clinical improvement since ED visit. These findings are consistent with a contaminant and not true bacteremia.   Name of physician (or Provider) Contacted: Dr. Emil  Current antibiotics: Was discharged on Augmentin after ED visit yesterday.   Changes to prescribed antibiotics recommended:  No changes to antibiotics required based on this blood culture result.   Results for orders placed or performed during the hospital encounter of 01/19/24  Blood Culture ID Panel (Reflexed) (Collected: 01/19/2024  1:53 PM)  Result Value Ref Range   Enterococcus faecalis NOT DETECTED NOT DETECTED   Enterococcus Faecium NOT DETECTED NOT DETECTED   Listeria monocytogenes NOT DETECTED NOT DETECTED   Staphylococcus species DETECTED (A) NOT DETECTED   Staphylococcus aureus (BCID) NOT DETECTED NOT DETECTED   Staphylococcus epidermidis NOT DETECTED NOT DETECTED   Staphylococcus lugdunensis NOT DETECTED NOT DETECTED   Streptococcus species NOT DETECTED NOT DETECTED   Streptococcus agalactiae NOT DETECTED NOT DETECTED   Streptococcus pneumoniae NOT DETECTED NOT DETECTED   Streptococcus pyogenes NOT DETECTED NOT DETECTED   A.calcoaceticus-baumannii NOT DETECTED NOT DETECTED   Bacteroides fragilis NOT DETECTED NOT DETECTED   Enterobacterales NOT DETECTED NOT DETECTED   Enterobacter cloacae complex NOT DETECTED NOT DETECTED   Escherichia coli NOT DETECTED NOT DETECTED   Klebsiella aerogenes NOT DETECTED NOT DETECTED   Klebsiella oxytoca NOT DETECTED  NOT DETECTED   Klebsiella pneumoniae NOT DETECTED NOT DETECTED   Proteus species NOT DETECTED NOT DETECTED   Salmonella species NOT DETECTED NOT DETECTED   Serratia marcescens NOT DETECTED NOT DETECTED   Haemophilus influenzae NOT DETECTED NOT DETECTED   Neisseria meningitidis NOT DETECTED NOT DETECTED   Pseudomonas aeruginosa NOT DETECTED NOT DETECTED   Stenotrophomonas maltophilia NOT DETECTED NOT DETECTED   Candida albicans NOT DETECTED NOT DETECTED   Candida auris NOT DETECTED NOT DETECTED   Candida glabrata NOT DETECTED NOT DETECTED   Candida krusei NOT DETECTED NOT DETECTED   Candida parapsilosis NOT DETECTED NOT DETECTED   Candida tropicalis NOT DETECTED NOT DETECTED   Cryptococcus neoformans/gattii NOT DETECTED NOT DETECTED   Maurilio Patten, PharmD PGY1 Pharmacy Resident [facility] 01/20/2024 9:28 PM

## 2024-01-20 NOTE — ED Provider Notes (Signed)
 [place] EMERGENCY DEPARTMENT AT [facility] Provider Note   CSN: [id number] Arrival date & time: 01/20/24  2056     Patient presents with: Positive Blood Cx   Shannon Mcintyre is a 89 y.o. female.   89 yo F with a chief complaints of transient confusion.  She was actually seen yesterday for this.  She had a positive blood culture and was called at home.  Had reportedly been doing better but since it was staph patient was encouraged to come back to be evaluated.  Still having a bit of a cough.  No continued confusion.  No difficulty breathing.        Prior to Admission medications   Medication Sig Start Date End Date Taking? Authorizing Provider  doxycycline (VIBRAMYCIN) 100 MG capsule Take 1 capsule (100 mg total) by mouth 2 (two) times daily. One po bid x 7 days 01/20/24  Yes Emil Share, DO  alendronate (FOSAMAX) 70 MG tablet Take 70 mg by mouth every Sunday.    [provider]  amLODipine (NORVASC) 5 MG tablet Take 5 mg by mouth every evening.    [provider]  Cholecalciferol (VITAMIN D3) 50 MCG (2000 UT) capsule Take 2,000 Units by mouth daily.    [provider]  Cyanocobalamin (VITAMIN B12) 1000 MCG TBCR Take 1,000 mcg by mouth See admin instructions. Take 1 tablet ( ) by mouth twice a week on Sunday and Friday. 03/09/20   [provider]  donepezil  (ARICEPT ) 10 MG tablet TAKE ONE TABLET BY MOUTH ONCE DAILY 05/14/23   Wertman, Sara E, PA-C  folic acid  (FOLVITE ) 1 MG tablet Take 1 mg by mouth See admin instructions. Take 1 tablet (1mg ) by mouth daily, 6 days a week. Do not folic acid  on methotrexate day (Wednesday).    [provider]  memantine  (NAMENDA ) 10 MG tablet Take 1 tablet twice a day 10/06/23   Wertman, Sara E, PA-C  methotrexate (RHEUMATREX) 2.5 MG tablet Take 10 mg by mouth See admin instructions. Take 4 tablets (10mg ) by mouth in the morning and in the evening on Wednesday only. Total dose of 20mg ,  once a week.    [provider]    Allergies: Patient has no known allergies.    Review of Systems  Updated Vital Signs BP 138/62   Pulse 67   Temp (!) 97.5 F (36.4 C) (Oral)   Resp 12   SpO2 99%   Physical Exam Vitals and nursing note reviewed.  Constitutional:      General: She is not in acute distress.    Appearance: She is well-developed. She is not diaphoretic.  HENT:     Head: Normocephalic and atraumatic.  Eyes:     Pupils: Pupils are equal, round, and reactive to light.  Cardiovascular:     Rate and Rhythm: Normal rate and regular rhythm.     Heart sounds: No murmur heard.    No friction rub. No gallop.  Pulmonary:     Effort: Pulmonary effort is normal.     Breath sounds: No wheezing or rales.  Abdominal:     General: There is no distension.     Palpations: Abdomen is soft.     Tenderness: There is no abdominal tenderness.  Musculoskeletal:        General: No tenderness.     Cervical back: Normal range of motion and neck supple.  Skin:    General: Skin is warm and dry.  Neurological:  Mental Status: She is alert and oriented to person, place, and time.  Psychiatric:        Behavior: Behavior normal.     (all labs ordered are listed, but only abnormal results are displayed) Labs Reviewed  CBC WITH DIFFERENTIAL/PLATELET - Abnormal; Notable for the following components:      Result Value   Monocytes Absolute 1.2 (*)    All other components within normal limits  COMPREHENSIVE METABOLIC PANEL WITH GFR - Abnormal; Notable for the following components:   GFR, Estimated 58 (*)    All other components within normal limits  I-STAT CHEM 8, ED - Abnormal; Notable for the following components:   Calcium, Ion 1.11 (*)    All other components within normal limits  CULTURE, BLOOD (ROUTINE X 2)  CULTURE, BLOOD (ROUTINE X 2)  I-STAT CG4 LACTIC ACID, ED  I-STAT CG4 LACTIC ACID, ED    EKG: None  Radiology: DG Chest Port 1 View Result Date:  01/20/2024 CLINICAL DATA:  Cough, fever, bacteremia EXAM: PORTABLE CHEST 1 VIEW COMPARISON:  01/19/2024 FINDINGS: Single frontal view of the chest demonstrates an unremarkable cardiac silhouette. No acute airspace disease, effusion, or pneumothorax. No acute bony abnormalities. IMPRESSION: 1. No acute intrathoracic process. Electronically Signed   By: Ozell Daring M.D.   On: 01/20/2024 21:50   CT Head Wo Contrast Result Date: 01/19/2024 CLINICAL DATA:  Altered level of consciousness EXAM: CT HEAD WITHOUT CONTRAST TECHNIQUE: Contiguous axial images were obtained from the base of the skull through the vertex without intravenous contrast. RADIATION DOSE REDUCTION: This exam was performed according to the departmental dose-optimization program which includes automated exposure control, adjustment of the mA and/or kV according to patient size and/or use of iterative reconstruction technique. COMPARISON:  12/31/2023 FINDINGS: Brain: No evidence of acute infarct or hemorrhage. Stable dilation of the lateral ventricles out of proportion to the degree of cerebral atrophy, which may reflect normal pressure hydrocephalus. Remaining midline structures are unremarkable. No acute extra-axial fluid collections. No mass effect. Vascular: No hyperdense vessel or unexpected calcification. Skull: Normal. Negative for fracture or focal lesion. Sinuses/Orbits: Mucosal thickening within the ethmoid air cells. Trace fluid within the left maxillary sinus. Stable right mastoid effusion. Other: None. IMPRESSION: 1. No acute infarct or hemorrhage. 2. Continued prominence of the lateral ventricles out of proportion to the degree of cerebral atrophy, which may reflect normal pressure hydrocephalus. 3. Mild ethmoid and maxillary sinus disease, new since prior study. Electronically Signed   By: Ozell Daring M.D.   On: 01/19/2024 15:41   DG Chest Port 1 View Result Date: 01/19/2024 CLINICAL DATA:  Questionable sepsis, altered mental  status EXAM: PORTABLE CHEST 1 VIEW COMPARISON:  12/31/2023 FINDINGS: Cardiomegaly. Mild diffuse bilateral interstitial opacity. Left retrocardiac atelectasis and or small pleural effusion. The visualized skeletal structures are unremarkable. IMPRESSION: 1. Cardiomegaly with mild diffuse bilateral interstitial opacity, consistent with edema or atypical/viral infection. 2. Left retrocardiac atelectasis and or small pleural effusion. Electronically Signed   By: Marolyn JONETTA Jaksch M.D.   On: 01/19/2024 14:29     Procedures   Medications Ordered in the ED - No data to display                                  Medical Decision Making Amount and/or Complexity of Data Reviewed Labs: ordered. Radiology: ordered.  Risk Prescription drug management.   89 yo F with a chief complaints of transient  confusion.  This occurred yesterday.  She was seen in the ED and her symptoms had resolved.  Has been doing well at home.  Was actually admitted for confusion about 3 weeks ago.  She had a positive blood culture came back with staph.  I discussed the results with ED clinical pharmacist.  Felt likely to represent contaminant with 1 out of 4 bottles and first bottle positive for staph but negative for Staph aureus or other typical checked bacteria.  Lactate negative.  No leukocytosis.  No anemia.  LFTs unremarkable.  Renal function at baseline.  Chest x-ray yesterday was concerning for possible pneumonia on my independent or potation.  Plain film today with clearing.  No obvious focal infiltrate or pneumothorax on my independent interpretation.  Discussed results with patient and family.  Will discharge at this time.  PCP follow-up.  11:58 PM:  I have discussed the diagnosis/risks/treatment options with the patient and family.  Evaluation and diagnostic testing in the emergency department does not suggest an emergent condition requiring admission or immediate intervention beyond what has been performed at this  time.  They will follow up with PCP. We also discussed returning to the ED immediately if new or worsening sx occur. We discussed the sx which are most concerning (e.g., sudden worsening pain, fever, inability to tolerate by mouth) that necessitate immediate return. Medications administered to the patient during their visit and any new prescriptions provided to the patient are listed below.  Medications given during this visit Medications - No data to display   The patient appears reasonably screen and/or stabilized for discharge and I doubt any other medical condition or other [facility] requiring further screening, evaluation, or treatment in the ED at this time prior to discharge.        Final diagnoses:  Acute cough  Positive blood culture    ED Discharge Orders          Ordered    doxycycline (VIBRAMYCIN) 100 MG capsule  2 times daily        01/20/24 2357               Tamer Baughman, DO 01/20/24 2358

## 2024-01-20 NOTE — ED Notes (Signed)
 Unsuccessful attempt at blood draw for labs. ED phlebotomy to see patient.

## 2024-01-20 NOTE — ED Notes (Signed)
 Called patients husband and spoke with him and reports she is doing better and advised she needs to come back to ER for evaluation due to positive blood cultures per Dr Emil.  Husband verbalized understanding but reports he can't get her here until tomorrow morning and she was prescribed an antibiotics prior to discharge which she is taking.

## 2024-01-20 NOTE — Discharge Instructions (Signed)
 I would have you start taking the antibiotics.  Please call your family doctor tomorrow with them know how you are doing.  Please return to the Emergency Department especially if you develop a fever confusion or difficulty breathing.  We did send off blood cultures today.  Typically it takes about 24 to 48 hours to get the results.

## 2024-01-21 DIAGNOSIS — M6281 Muscle weakness (generalized): Secondary | ICD-10-CM | POA: Diagnosis not present

## 2024-01-21 DIAGNOSIS — M1990 Unspecified osteoarthritis, unspecified site: Secondary | ICD-10-CM | POA: Diagnosis not present

## 2024-01-21 LAB — CULTURE, BLOOD (ROUTINE X 2)

## 2024-01-22 ENCOUNTER — Telehealth (HOSPITAL_BASED_OUTPATIENT_CLINIC_OR_DEPARTMENT_OTHER): Payer: Self-pay

## 2024-01-22 NOTE — Telephone Encounter (Signed)
 Post ED Visit - Positive Culture Follow-up  Culture report reviewed by antimicrobial stewardship pharmacist: [facility] []  Rankin Dee, Pharm.D. []  Venetia Gully, Pharm.D., BCPS AQ-ID []  Garrel Crews, Pharm.D., BCPS []  Almarie Lunger, [address].D., BCPS []  [place], [address].D., BCPS, AAHIVP []  Rosaline Bihari, Pharm.D., BCPS, AAHIVP []  Vernell Meier, PharmD, BCPS []  Latanya Hint, PharmD, BCPS []  Donald Medley, PharmD, BCPS []  Rocky Bold, PharmD []  Dorothyann Alert, PharmD, BCPS []  Morene Babe, PharmD X  Powell Blush, PharmD  [facility] []  Rosaline Edison, PharmD []  Romona Bliss, PharmD []  Dolphus Roller, PharmD []  Veva Seip, Rph []  Vernell Daunt) Leonce, PharmD []  Eva Allis, PharmD []  Rosaline Millet, PharmD []  Iantha Batch, PharmD []  Arvin Gauss, PharmD []  Wanda Hasting, PharmD []  Ronal Rav, PharmD []  Rocky Slade, PharmD []  Bard Jeans, PharmD   Positive Blood culture Treated with Amoxicillin-Pot-Clavulanate 875-125 mg  Results already discussed with Dr Emil Gaskins, Avelina Buss 01/22/2024, 11:32 AM

## 2024-01-23 DIAGNOSIS — M1990 Unspecified osteoarthritis, unspecified site: Secondary | ICD-10-CM | POA: Diagnosis not present

## 2024-01-23 DIAGNOSIS — M6281 Muscle weakness (generalized): Secondary | ICD-10-CM | POA: Diagnosis not present

## 2024-01-24 LAB — CULTURE, BLOOD (ROUTINE X 2): Culture: NO GROWTH

## 2024-01-25 LAB — CULTURE, BLOOD (ROUTINE X 2)
Culture: NO GROWTH
Special Requests: ADEQUATE

## 2024-01-26 DIAGNOSIS — M1990 Unspecified osteoarthritis, unspecified site: Secondary | ICD-10-CM | POA: Diagnosis not present

## 2024-01-26 DIAGNOSIS — M6281 Muscle weakness (generalized): Secondary | ICD-10-CM | POA: Diagnosis not present

## 2024-01-26 LAB — CULTURE, BLOOD (ROUTINE X 2): Culture: NO GROWTH

## 2024-01-28 DIAGNOSIS — M6281 Muscle weakness (generalized): Secondary | ICD-10-CM | POA: Diagnosis not present

## 2024-01-28 DIAGNOSIS — M1990 Unspecified osteoarthritis, unspecified site: Secondary | ICD-10-CM | POA: Diagnosis not present

## 2024-01-30 DIAGNOSIS — M1990 Unspecified osteoarthritis, unspecified site: Secondary | ICD-10-CM | POA: Diagnosis not present

## 2024-01-30 DIAGNOSIS — M6281 Muscle weakness (generalized): Secondary | ICD-10-CM | POA: Diagnosis not present

## 2024-02-02 DIAGNOSIS — I129 Hypertensive chronic kidney disease with stage 1 through stage 4 chronic kidney disease, or unspecified chronic kidney disease: Secondary | ICD-10-CM | POA: Diagnosis not present

## 2024-02-02 DIAGNOSIS — R410 Disorientation, unspecified: Secondary | ICD-10-CM | POA: Diagnosis not present

## 2024-02-02 DIAGNOSIS — M6281 Muscle weakness (generalized): Secondary | ICD-10-CM | POA: Diagnosis not present

## 2024-02-02 DIAGNOSIS — G301 Alzheimer's disease with late onset: Secondary | ICD-10-CM | POA: Diagnosis not present

## 2024-02-02 DIAGNOSIS — M1990 Unspecified osteoarthritis, unspecified site: Secondary | ICD-10-CM | POA: Diagnosis not present

## 2024-02-02 DIAGNOSIS — J302 Other seasonal allergic rhinitis: Secondary | ICD-10-CM | POA: Diagnosis not present

## 2024-02-02 DIAGNOSIS — F02B0 Dementia in other diseases classified elsewhere, moderate, without behavioral disturbance, psychotic disturbance, mood disturbance, and anxiety: Secondary | ICD-10-CM | POA: Diagnosis not present

## 2024-02-02 DIAGNOSIS — R54 Age-related physical debility: Secondary | ICD-10-CM | POA: Diagnosis not present

## 2024-02-02 DIAGNOSIS — R2689 Other abnormalities of gait and mobility: Secondary | ICD-10-CM | POA: Diagnosis not present

## 2024-02-03 DIAGNOSIS — Z9622 Myringotomy tube(s) status: Secondary | ICD-10-CM | POA: Diagnosis not present

## 2024-02-03 DIAGNOSIS — H9211 Otorrhea, right ear: Secondary | ICD-10-CM | POA: Diagnosis not present

## 2024-02-04 DIAGNOSIS — M6281 Muscle weakness (generalized): Secondary | ICD-10-CM | POA: Diagnosis not present

## 2024-02-04 DIAGNOSIS — M1990 Unspecified osteoarthritis, unspecified site: Secondary | ICD-10-CM | POA: Diagnosis not present

## 2024-02-06 DIAGNOSIS — M6281 Muscle weakness (generalized): Secondary | ICD-10-CM | POA: Diagnosis not present

## 2024-02-06 DIAGNOSIS — M1990 Unspecified osteoarthritis, unspecified site: Secondary | ICD-10-CM | POA: Diagnosis not present

## 2024-02-10 DIAGNOSIS — H6991 Unspecified Eustachian tube disorder, right ear: Secondary | ICD-10-CM | POA: Diagnosis not present

## 2024-02-10 DIAGNOSIS — Z9622 Myringotomy tube(s) status: Secondary | ICD-10-CM | POA: Diagnosis not present

## 2024-02-10 DIAGNOSIS — H9113 Presbycusis, bilateral: Secondary | ICD-10-CM | POA: Diagnosis not present

## 2024-02-11 DIAGNOSIS — M1990 Unspecified osteoarthritis, unspecified site: Secondary | ICD-10-CM | POA: Diagnosis not present

## 2024-02-11 DIAGNOSIS — M6281 Muscle weakness (generalized): Secondary | ICD-10-CM | POA: Diagnosis not present

## 2024-02-13 DIAGNOSIS — M6281 Muscle weakness (generalized): Secondary | ICD-10-CM | POA: Diagnosis not present

## 2024-02-13 DIAGNOSIS — M1990 Unspecified osteoarthritis, unspecified site: Secondary | ICD-10-CM | POA: Diagnosis not present

## 2024-02-16 DIAGNOSIS — M1990 Unspecified osteoarthritis, unspecified site: Secondary | ICD-10-CM | POA: Diagnosis not present

## 2024-02-16 DIAGNOSIS — M6281 Muscle weakness (generalized): Secondary | ICD-10-CM | POA: Diagnosis not present

## 2024-02-18 DIAGNOSIS — M6281 Muscle weakness (generalized): Secondary | ICD-10-CM | POA: Diagnosis not present

## 2024-02-18 DIAGNOSIS — M1990 Unspecified osteoarthritis, unspecified site: Secondary | ICD-10-CM | POA: Diagnosis not present

## 2024-02-20 DIAGNOSIS — M1990 Unspecified osteoarthritis, unspecified site: Secondary | ICD-10-CM | POA: Diagnosis not present

## 2024-02-20 DIAGNOSIS — M6281 Muscle weakness (generalized): Secondary | ICD-10-CM | POA: Diagnosis not present

## 2024-02-23 DIAGNOSIS — M1990 Unspecified osteoarthritis, unspecified site: Secondary | ICD-10-CM | POA: Diagnosis not present

## 2024-02-23 DIAGNOSIS — M6281 Muscle weakness (generalized): Secondary | ICD-10-CM | POA: Diagnosis not present

## 2024-02-25 DIAGNOSIS — M6281 Muscle weakness (generalized): Secondary | ICD-10-CM | POA: Diagnosis not present

## 2024-02-25 DIAGNOSIS — M1990 Unspecified osteoarthritis, unspecified site: Secondary | ICD-10-CM | POA: Diagnosis not present

## 2024-02-27 DIAGNOSIS — M1990 Unspecified osteoarthritis, unspecified site: Secondary | ICD-10-CM | POA: Diagnosis not present

## 2024-02-27 DIAGNOSIS — M6281 Muscle weakness (generalized): Secondary | ICD-10-CM | POA: Diagnosis not present

## 2024-03-15 DIAGNOSIS — R413 Other amnesia: Secondary | ICD-10-CM | POA: Diagnosis not present

## 2024-03-15 DIAGNOSIS — M810 Age-related osteoporosis without current pathological fracture: Secondary | ICD-10-CM | POA: Diagnosis not present

## 2024-03-15 DIAGNOSIS — Z79899 Other long term (current) drug therapy: Secondary | ICD-10-CM | POA: Diagnosis not present

## 2024-03-16 DIAGNOSIS — Z79899 Other long term (current) drug therapy: Secondary | ICD-10-CM | POA: Diagnosis not present

## 2024-03-16 DIAGNOSIS — L400 Psoriasis vulgaris: Secondary | ICD-10-CM | POA: Diagnosis not present

## 2024-03-30 NOTE — Progress Notes (Signed)
 "   Dementia due to Alzheimer's disease   Shannon Mcintyre is a very pleasant 89 y.o. RH female with a history ofhypertension, hyperlipidemia, psoriasis, HOH, vocal cord atrophy, slight ventriculomegaly per MRI of the brain without NPH symptoms and a diagnosis of dementia due to Alzheimer's disease seen today in follow up for memory loss. Patient is currently on donepezil  10 mg daily and memantine  10 mg twice daily***.  This patient is accompanied in the office by her daughters*** who supplements the history.  Previous records as well as any outside records available were reviewed prior to todays visit. Patient was last seen on 10/06/2023***. Memory is ***. MMSE today is  /30. Patient is able to participate on ADLs with some assistance, no longer drives.  Mood is good.     Continue donepezil  10 mg daily and memantine  10 mg twice daily, side effects discussed Recommend good control of cardiovascular risk factors.   Recommend using the hearing aids in an effort to improve comprehension Continue to control mood as per PCP Follow up in  months   Discussed the use of AI scribe software for clinical note transcription with the patient, who gave verbal consent to proceed.  History of Present Illness     Any changes in memory since last visit? About the same.  She tries to remain busy.  She might forget recent information such as conversations, but she is good with names according to her daughters. LTM is good.  repeats oneself?  Endorsed Disoriented when walking into a room? Denies    Leaving objects? Denies  Wandering behavior?  denies   Any personality changes since last visit?  She had an episode of confusion 10-25, and then on 01/19/2024.  The first one,  She also was having unsteady gait and not acting like herself, feeling too weak and disoriented with the day in when going for breakfast.  She had received both the flu and the COVID vaccines the day before, and initially was attributed to  that.  At the ER, her workup was negative for acute findings, she did receive IV fluids, did well overnight to her baseline.  Presentation, she was positive for pneumonia treated with antibiotics with good response.   Any worsening depression?:  Denies.   Hallucinations or paranoia?  Denies.   Seizures? denies    Any sleep changes?  Sleeps well 10 h-12 h at night.  Denies vivid dreams, REM behavior or sleepwalking   Sleep apnea?   Denies.   Any hygiene concerns? Denies.  Independent of bathing and dressing? She needs some assistance  Does the patient needs help with medications?  Daughters and caregivers in charge   Who is in charge of the finances?  Husband is in charge    Any changes in appetite?  denies     Patient have trouble swallowing? Denies.   Does the patient cook? No Any headaches?   denies   Any vision changes? Denies  Chronic back pain  denies   Ambulates with difficulty? Denies.  Uses a rollator for stability Recent falls or head injuries? Denies.     Unilateral weakness, numbness or tingling? denies   Any tremors?  Denies   Any anosmia?  Denies   Any incontinence of urine?  Endorsed, wears Pull-ups  Any bowel dysfunction?   Occasional diarrhea      Patient lives with her husband at [facility]  Does the patient drive? No longer drives       HISTORY OF  PRESENT ILLNESS 06/23/20: This is an 89 year old right-handed woman with a history of hypertension, hyperlipidemia, vocal cord atrophy, presenting for evaluation of memory loss and slight ventriculomegaly on brain MRI. Her husband is present and her son Johnie from [place]  is on speakerphone to provide additional information. She feels her memory is pretty good as far as I can tell. Her husband started noticing memory changes around a year ago, however Johnie started noticing minor changes 5 years ago where she would pick up the wrong purse or ask the same question. She was misplacing things and asking where to put  things back. They would find objects where they don't belong. Cooking was becoming an issue, she would leave the stove on. She stated she manages her own medications, [place] reports medications are put in her pillbox and they give it to her AM and PM. Her husband manages finances. She stopped driving after her knee surgery, they won't let me drive, denies getting lost. Family agrees her sense of direction is perfect but reaction times are slower. They moved to [place] a year ago. A caregiver comes three times a week to help with showering. She is independent with dressing. There is no family history of dementia. No history of significant head injuries or alcohol use.   She denies any headaches, dizziness, diplopia, dysarthria, dysphagia, neck/back pain, focal numbness/tingling/weakness, anosmia, tremors. She has had problems with incontinence, there has been some improvement but not foolproof, she still wears Depends and wakes up wet in the morning. They deny any falls, she states her walking could be better, she can't walk straight and sometimes has trouble walking. She did not want to do physical therapy after her 2 knee replacements and walks like a cowboy. Sleep is good, no REM behavior disorder. Mood is pretty good, no paranoia or hallucinations.    I personally reviewed MRI brain with and without contrast done 03/2020 which did not show any acute changes. There was diffuse atrophy with ventricles slightly prominent relative to sulci, moderate chronic microvascular disease.         10/06/2023    4:00 PM 05/14/2023    4:00 PM 11/11/2022   12:00 PM  MMSE - Mini Mental State Exam  Orientation to time 2 3 5   Orientation to Place 3 4 4   Registration 3 3 3   Attention/ Calculation 1 5 5   Recall 0 1 1  Language- name 2 objects 2 2 2   Language- repeat 1 1 1   Language- follow 3 step command 3 3 3   Language- read & follow direction 1 1 1   Write a sentence 1 1 1   Copy design 1 0 1  Total  score 18 24 27       06/23/2020    2:00 PM  Montreal Cognitive Assessment   Visuospatial/ Executive (0/5) 0  Naming (0/3) 2  Attention: Read list of digits (0/2) 1  Attention: Read list of letters (0/1) 1  Attention: Serial 7 subtraction starting at 100 (0/3) 1  Language: Repeat phrase (0/2) 1  Language : Fluency (0/1) 0  Abstraction (0/2) 0  Delayed Recall (0/5) 0  Orientation (0/6) 5  Total 11  Adjusted Score (based on education) 11      Objective:    Neurological Exam:    VITALS:  There were no vitals filed for this visit.  GEN:  The patient appears stated age and is in NAD. HEENT:  Normocephalic, atraumatic.   Neurological examination:  General: NAD, well-groomed, appears  stated age. Orientation: The patient is alert. Oriented to person, place and date Cranial nerves: There is good facial symmetry.The speech is fluent and clear. No aphasia or dysarthria. Fund of knowledge is reduced. Recent and remote memory are impaired. Attention and concentration are reduced. Able to name objects and repeat phrases.  Hearing is intact to conversational tone. *** Sensation: Sensation is intact to light touch throughout Motor: Strength is at least antigravity x4. DTR's 2/4 in UE/LE     Movement examination:  Tone: There is normal tone in the UE/LE Abnormal movements:  no tremor.  No myoclonus.  No asterixis.   Coordination:  There is some decremation with RAM's. Normal finger to nose  Gait and Station: The patient has no*** difficulty arising out of a deep-seated chair without the use of the hands. The patient's stride length is good.  Gait is cautious and mildly wide based, no magnetic steps noted.    Thank you for allowing us  the opportunity to participate in the care of this nice patient. Please do not hesitate to contact us  for any questions or concerns.   Total time spent on today's visit was *** minutes dedicated to this patient today, preparing to see patient, examining  the patient, ordering tests and/or medications and counseling the patient, documenting clinical information in the EHR or other health record, independently interpreting results and communicating results to the patient/family, discussing treatment and goals, answering patient's questions and coordinating care.  Cc:  Charlott Dorn LABOR, MD  Camie Sevin 03/30/2024 6:10 AM      "

## 2024-03-31 ENCOUNTER — Encounter: Payer: Self-pay | Admitting: Physician Assistant

## 2024-03-31 ENCOUNTER — Ambulatory Visit (INDEPENDENT_AMBULATORY_CARE_PROVIDER_SITE_OTHER): Admitting: Physician Assistant

## 2024-03-31 VITALS — BP 145/73 | HR 73 | Resp 20 | Wt 130.0 lb

## 2024-03-31 DIAGNOSIS — F0280 Dementia in other diseases classified elsewhere without behavioral disturbance: Secondary | ICD-10-CM | POA: Diagnosis not present

## 2024-03-31 DIAGNOSIS — G309 Alzheimer's disease, unspecified: Secondary | ICD-10-CM | POA: Diagnosis not present

## 2024-05-01 ENCOUNTER — Emergency Department (HOSPITAL_COMMUNITY)

## 2024-05-01 ENCOUNTER — Inpatient Hospital Stay (HOSPITAL_COMMUNITY)
Admission: EM | Admit: 2024-05-01 | Discharge: 2024-05-04 | DRG: 481 | Disposition: A | Discharge status: Skilled Nursing Facility | Attending: Internal Medicine | Admitting: Internal Medicine

## 2024-05-01 ENCOUNTER — Encounter (HOSPITAL_COMMUNITY): Payer: Self-pay | Admitting: Emergency Medicine

## 2024-05-01 DIAGNOSIS — Z7983 Long term (current) use of bisphosphonates: Secondary | ICD-10-CM

## 2024-05-01 DIAGNOSIS — F0280 Dementia in other diseases classified elsewhere without behavioral disturbance: Secondary | ICD-10-CM | POA: Diagnosis present

## 2024-05-01 DIAGNOSIS — Z79631 Long term (current) use of antimetabolite agent: Secondary | ICD-10-CM

## 2024-05-01 DIAGNOSIS — S72001A Fracture of unspecified part of neck of right femur, initial encounter for closed fracture: Principal | ICD-10-CM

## 2024-05-01 DIAGNOSIS — M47812 Spondylosis without myelopathy or radiculopathy, cervical region: Secondary | ICD-10-CM | POA: Diagnosis present

## 2024-05-01 DIAGNOSIS — Z66 Do not resuscitate: Secondary | ICD-10-CM | POA: Diagnosis present

## 2024-05-01 DIAGNOSIS — Z96653 Presence of artificial knee joint, bilateral: Secondary | ICD-10-CM | POA: Diagnosis present

## 2024-05-01 DIAGNOSIS — Z79899 Other long term (current) drug therapy: Secondary | ICD-10-CM

## 2024-05-01 DIAGNOSIS — R638 Other symptoms and signs concerning food and fluid intake: Secondary | ICD-10-CM | POA: Insufficient documentation

## 2024-05-01 DIAGNOSIS — Z8249 Family history of ischemic heart disease and other diseases of the circulatory system: Secondary | ICD-10-CM

## 2024-05-01 DIAGNOSIS — D62 Acute posthemorrhagic anemia: Secondary | ICD-10-CM | POA: Insufficient documentation

## 2024-05-01 DIAGNOSIS — S72141A Displaced intertrochanteric fracture of right femur, initial encounter for closed fracture: Principal | ICD-10-CM | POA: Diagnosis present

## 2024-05-01 DIAGNOSIS — G309 Alzheimer's disease, unspecified: Secondary | ICD-10-CM | POA: Diagnosis present

## 2024-05-01 DIAGNOSIS — Z90710 Acquired absence of both cervix and uterus: Secondary | ICD-10-CM

## 2024-05-01 DIAGNOSIS — I10 Essential (primary) hypertension: Secondary | ICD-10-CM | POA: Diagnosis present

## 2024-05-01 DIAGNOSIS — J324 Chronic pansinusitis: Secondary | ICD-10-CM | POA: Diagnosis present

## 2024-05-01 DIAGNOSIS — W1809XA Striking against other object with subsequent fall, initial encounter: Secondary | ICD-10-CM | POA: Diagnosis present

## 2024-05-01 LAB — CBC WITH DIFFERENTIAL/PLATELET
Abs Immature Granulocytes: 0.04 10*3/uL (ref 0.00–0.07)
Basophils Absolute: 0 10*3/uL (ref 0.0–0.1)
Basophils Relative: 0 %
Eosinophils Absolute: 0.1 10*3/uL (ref 0.0–0.5)
Eosinophils Relative: 1 %
HCT: 37.8 % (ref 36.0–46.0)
Hemoglobin: 12.9 g/dL (ref 12.0–15.0)
Immature Granulocytes: 1 %
Lymphocytes Relative: 10 %
Lymphs Abs: 0.9 10*3/uL (ref 0.7–4.0)
MCH: 32 pg (ref 26.0–34.0)
MCHC: 34.1 g/dL (ref 30.0–36.0)
MCV: 93.8 fL (ref 80.0–100.0)
Monocytes Absolute: 0.2 10*3/uL (ref 0.1–1.0)
Monocytes Relative: 3 %
Neutro Abs: 7.3 10*3/uL (ref 1.7–7.7)
Neutrophils Relative %: 85 %
Platelets: 191 10*3/uL (ref 150–400)
RBC: 4.03 MIL/uL (ref 3.87–5.11)
RDW: 14.3 % (ref 11.5–15.5)
WBC: 8.6 10*3/uL (ref 4.0–10.5)
nRBC: 0 % (ref 0.0–0.2)

## 2024-05-01 LAB — URINALYSIS, W/ REFLEX TO CULTURE (INFECTION SUSPECTED)
Bacteria, UA: NONE SEEN
Bilirubin Urine: NEGATIVE
Glucose, UA: NEGATIVE mg/dL
Hgb urine dipstick: NEGATIVE
Ketones, ur: NEGATIVE mg/dL
Leukocytes,Ua: NEGATIVE
Nitrite: NEGATIVE
Protein, ur: NEGATIVE mg/dL
Specific Gravity, Urine: 1.019 (ref 1.005–1.030)
pH: 5 (ref 5.0–8.0)

## 2024-05-01 LAB — TYPE AND SCREEN
ABO/RH(D): A POS
Antibody Screen: NEGATIVE

## 2024-05-01 LAB — BASIC METABOLIC PANEL WITH GFR
Anion gap: 10 (ref 5–15)
BUN: 17 mg/dL (ref 8–23)
CO2: 24 mmol/L (ref 22–32)
Calcium: 9.2 mg/dL (ref 8.9–10.3)
Chloride: 104 mmol/L (ref 98–111)
Creatinine, Ser: 0.81 mg/dL (ref 0.44–1.00)
GFR, Estimated: >60 mL/min
Glucose, Bld: 113 mg/dL — ABNORMAL HIGH (ref 70–99)
Potassium: 4.8 mmol/L (ref 3.5–5.1)
Sodium: 138 mmol/L (ref 135–145)

## 2024-05-01 LAB — CK: Total CK: 44 U/L (ref 38–234)

## 2024-05-01 MED ORDER — SENNOSIDES-DOCUSATE SODIUM 8.6-50 MG PO TABS: 1.0000 | ORAL_TABLET | Freq: Every evening | ORAL | Status: DC | PRN

## 2024-05-01 MED ORDER — HYDROCODONE-ACETAMINOPHEN 5-325 MG PO TABS: 1.0000 | ORAL_TABLET | Freq: Four times a day (QID) | ORAL | Status: DC | PRN

## 2024-05-01 MED ORDER — AMLODIPINE BESYLATE 5 MG PO TABS
5.0000 mg | ORAL_TABLET | Freq: Every evening | ORAL | Status: DC
  Administered 2024-05-01: 5 mg via ORAL
  Filled 2024-05-01 (×3): qty 1

## 2024-05-01 MED ORDER — ONDANSETRON HCL 4 MG/2ML IJ SOLN: 4.0000 mg | Freq: Four times a day (QID) | INTRAMUSCULAR | Status: DC | PRN

## 2024-05-01 MED ORDER — LACTATED RINGERS IV SOLN: INTRAVENOUS | Status: DC

## 2024-05-01 MED ORDER — MEMANTINE HCL 5 MG PO TABS
10.0000 mg | ORAL_TABLET | Freq: Two times a day (BID) | ORAL | Status: DC
  Administered 2024-05-01 – 2024-05-04 (×5): 10 mg via ORAL
  Filled 2024-05-01: qty 1
  Filled 2024-05-01: qty 2
  Filled 2024-05-01 (×3): qty 1

## 2024-05-01 MED ORDER — BISACODYL 5 MG PO TBEC: 5.0000 mg | DELAYED_RELEASE_TABLET | Freq: Every day | ORAL | Status: DC | PRN

## 2024-05-01 MED ORDER — ACETAMINOPHEN 325 MG PO TABS: 650.0000 mg | ORAL_TABLET | Freq: Four times a day (QID) | ORAL | Status: DC | PRN

## 2024-05-01 MED ORDER — MORPHINE SULFATE (PF) 2 MG/ML IV SOLN
0.5000 mg | INTRAVENOUS | Status: DC | PRN
  Administered 2024-05-02: 0.5 mg via INTRAVENOUS
  Filled 2024-05-01 (×2): qty 1

## 2024-05-01 MED ORDER — DONEPEZIL HCL 5 MG PO TABS
10.0000 mg | ORAL_TABLET | Freq: Every day | ORAL | Status: DC
  Administered 2024-05-01 – 2024-05-03 (×3): 10 mg via ORAL
  Filled 2024-05-01: qty 1
  Filled 2024-05-01: qty 2
  Filled 2024-05-01: qty 1

## 2024-05-01 NOTE — H&P (Signed)
 " History and Physical    [name] FMW:[id number] DOB: [birth year] DOA: 05/01/2024  PCP: Charlott Dorn LABOR, MD  Patient coming from: [facility]  I have personally briefly reviewed patient's old medical records in [facility]  Chief Complaint: Right hip pain after fall  HPI: Shannon Mcintyre is a 89 y.o. female with medical history significant for Alzheimer's dementia, HTN, psoriasis who presented to the ED for evaluation of right hip pain after fall.  History limited from patient due to dementia which is otherwise supplemented by EDP, chart review, and daughters at bedside.  Patient normally ambulates with the use of a rollator walker.  She was in an apartment earlier today with her caregiver when she tripped on a rug and fell onto her right hip.  She had significant right hip pain afterwards.  EMS were contacted.  She was noted to have shortening and rotation of her right lower extremity.  She was given 100 mcg fentanyl  en route to the ED.  Patient is pleasantly demented.  She notes that she fell earlier but is otherwise only oriented to self.  She reports she only has pain when she moves her right leg.  She denies any cough, shortness of breath, chest pain.  ED Course  Labs/Imaging on admission: I have personally reviewed following labs and imaging studies.  Initial vitals showed BP 166/65, pulse 69, RR 16, temp 97.6 F, SpO2 95% on room air.  Labs showed WBC 8.6, hemoglobin 12.9, platelets 191, sodium 138, potassium 4.8, bicarb 24, BUN 17, creatinine 0.81, serum glucose 113, CK 44.  UA negative for UTI.  Right hip x-ray showed an acute minimally displaced intertrochanteric fracture of the right femur.  Portable chest x-ray with hazy opacities in the left greater than right lower lungs.  CT head without contrast negative for acute intracranial normality.  Similar.  Diffuse ventricular prominence out of proportion to cortical sulcation noted.  Chronic  pansinusitis.  CT cervical spine without contrast negative for acute traumatic injury.  Moderate to advanced spondylosis at C4-5 through C6-7 and moderate to advanced degenerative osteoarthritic changes about the skull base/C1-2 articulations.  EDP discussed with orthopedics (Dr. Kendal) who recommended medical admission, patient can stay at [facility], their team will see in consultation.  The hospitalist service was consulted for admission.  Review of Systems: All systems reviewed and are negative except as documented in history of present illness above.   Past Medical History:  Diagnosis Date   AMS (altered mental status) 12/31/2023   Arthritis    Closed fracture of left olecranon process 07/28/2018   Diverticulosis of colon    Eczema    Hypertension    Left elbow fracture    S/P total knee replacement, left     Past Surgical History:  Procedure Laterality Date   ABDOMINAL HYSTERECTOMY  1980   BSO   APPENDECTOMY  1980   knee arthoscopy  2010   right   KNEE ARTHROSCOPY  2001   left   ORIF ELBOW FRACTURE Left 07/28/2018   Procedure: OPEN REDUCTION INTERNAL FIXATION (ORIF) ELBOW/OLECRANON FRACTURE;  Surgeon: Josefina Chew, MD;  Location: [facility];  Service: Orthopedics;  Laterality: Left;   TOTAL KNEE ARTHROPLASTY Left 08/12/2016   Procedure: LEFT TOTAL KNEE ARTHROPLASTY;  Surgeon: Jane Charleston, MD;  Location: [facility] OR;  Service: Orthopedics;  Laterality: Left;   TOTAL KNEE ARTHROPLASTY Right 06/28/2019   Procedure: TOTAL KNEE ARTHROPLASTY;  Surgeon: Jane Charleston, MD;  Location: [facility] ORS;  Service: Orthopedics;  Laterality: Right;    Social History: Social History[1]  Allergies[2]  Family History  Problem Relation Age of Onset   Pneumonia Mother 77   Heart attack Father 56       Possibly CAD starting in his 31s   Parkinson's disease Brother      Prior to Admission medications  Medication Sig Start Date End Date Taking? Authorizing Provider   alendronate (FOSAMAX) 70 MG tablet Take 70 mg by mouth every Sunday.    [provider]  amLODipine  (NORVASC ) 5 MG tablet Take 5 mg by mouth every evening.    [provider]  Cholecalciferol (VITAMIN D3) 50 MCG (2000 UT) capsule Take 2,000 Units by mouth daily.    [provider]  Cyanocobalamin  (VITAMIN B12) 1000 MCG TBCR Take 1,000 mcg by mouth See admin instructions. Take 1 tablet ( ) by mouth twice a week on Sunday and Friday. 03/09/20   [provider]  donepezil  (ARICEPT ) 10 MG tablet TAKE ONE TABLET BY MOUTH ONCE DAILY 05/14/23   Wertman, Sara E, PA-C  doxycycline  (VIBRAMYCIN ) 100 MG capsule Take 1 capsule (100 mg total) by mouth 2 (two) times daily. One po bid x 7 days 01/20/24   Emil Share, DO  folic acid  (FOLVITE ) 1 MG tablet Take 1 mg by mouth See admin instructions. Take 1 tablet (1mg ) by mouth daily, 6 days a week. Do not folic acid  on methotrexate  day (Wednesday).    [provider]  memantine  (NAMENDA ) 10 MG tablet Take 1 tablet twice a day 10/06/23   Wertman, Sara E, PA-C  methotrexate  (RHEUMATREX) 2.5 MG tablet Take 10 mg by mouth See admin instructions. Take 4 tablets (10mg ) by mouth in the morning and in the evening on Wednesday only. Total dose of 20mg , once a week.    [provider]    Physical Exam: Vitals:   05/01/24 1920 05/01/24 1930 05/01/24 1956 05/01/24 2030  BP: (!) 162/54 (!) 166/65  (!) 155/56  Pulse: 67 69  67  Resp: 16 16  18   Temp: 97.6 F (36.4 C)     TempSrc: Oral     SpO2: 94% 95% 96% 96%   Constitutional: Elderly woman resting supine in bed, NAD, calm, comfortable Eyes: EOMI, lids and conjunctivae normal ENMT: Hard of hearing.  Mucous membranes are moist. Posterior pharynx clear of any exudate or lesions.Normal dentition.  Neck: normal, supple, no masses. Respiratory: clear to auscultation anteriorly. Normal respiratory effort. No accessory muscle use.  Cardiovascular: Regular rate and rhythm,  no murmurs / rubs / gallops. No extremity edema. 2+ pedal pulses. Abdomen: no tenderness, no masses palpated. Musculoskeletal: Diminished ROM RLE due to right hip fracture, right foot everted Skin: no rashes, lesions, ulcers. No induration Neurologic: Sensation intact. Strength diminished RLE due to right hip fracture otherwise intact Psychiatric: Awake, alert, oriented to self only  EKG: Personally reviewed. Sinus rhythm, rate 77, PVC.  PVC is new when compared to previous.  Assessment/Plan Principal Problem:   Intertrochanteric fracture of right femur, closed, initial encounter [facility]) Active Problems:   Essential hypertension   Alzheimer's dementia without behavioral disturbance (HCC)   Lady S Brissette is a 89 y.o. female with medical history significant for Alzheimer's dementia, HTN, psoriasis who is admitted with a right hip fracture after mechanical fall.  Assessment and Plan: Acute intertrochanteric fracture of the right femur: Occurring after apparent mechanical fall.  EDP discussed case with orthopedics who will consult.  Will keep n.p.o. after midnight.  Hazy opacities on chest x-ray: Hazy opacities in bilateral lower lungs seen on portable chest x-ray.  Patient does not have any respiratory symptoms, fever, or leukocytosis.  Continue to monitor off antibiotics.  Aspiration precautions.  Hypertension Continue amlodipine  5 mg daily.  Alzheimer's dementia: Mentation is at baseline per family.  Continue donepezil  and memantine .  Fall and delirium precautions.   DVT prophylaxis: SCDs Start: 05/01/24 2207 Code Status:   Code Status: Limited: Do not attempt resuscitation (DNR) -DNR-LIMITED -Do Not Intubate/DNI   discussed with daughters on admission Family Communication: Daughters at bedside Disposition Plan: Pending clinical progress Consults called: Orthopedics Severity of Illness: The appropriate patient status for this patient is INPATIENT. Inpatient status is judged to  be reasonable and necessary in order to provide the required intensity of service to ensure the patient's safety. The patient's presenting symptoms, physical exam findings, and initial radiographic and laboratory data in the context of their chronic comorbidities is felt to place them at high risk for further clinical deterioration. Furthermore, it is not anticipated that the patient will be medically stable for discharge from the hospital within 2 midnights of admission.   * I certify that at the point of admission it is my clinical judgment that the patient will require inpatient hospital care spanning beyond 2 midnights from the point of admission due to high intensity of service, high risk for further deterioration and high frequency of surveillance required.DEWAINE Jorie Blanch MD [facility]  If 7PM-7AM, please contact night-coverage [email or web address]  05/01/2024, 10:13 PM      [1]  Social History Tobacco Use   Smoking status: Never   Smokeless tobacco: Never  Vaping Use   Vaping status: Never Used  Substance Use Topics   Alcohol use: No   Drug use: No  [2] No Known Allergies  "

## 2024-05-01 NOTE — ED Provider Notes (Signed)
 " [place] EMERGENCY DEPARTMENT AT [facility] Provider Note   CSN: [id number] Arrival date & time: 05/01/24  8090     Patient presents with: Hip Pain and Fall   Shannon Mcintyre is a 89 y.o. female.   This is a 89 year old female who fell prior to arrival.  Patient states he tripped over a rug.  States this occurred yesterday.  That she was on the ground for quite some time.  Complains of severe pain to her right hip characterized as sharp and worse with movement.  Was not able to ambulate.  Denies any head or neck pain.  She however is unsure if she struck her head.  No chest or abdominal discomfort.  No right knee or right foot pain.  Patient lives in independent living and EMS was contacted.  Was given 100 mcg of fentanyl  prior to arrival.  She does not take any blood thinners       Prior to Admission medications  Medication Sig Start Date End Date Taking? Authorizing Provider  alendronate (FOSAMAX) 70 MG tablet Take 70 mg by mouth every Sunday.    [provider]  amLODipine  (NORVASC ) 5 MG tablet Take 5 mg by mouth every evening.    [provider]  Cholecalciferol (VITAMIN D3) 50 MCG (2000 UT) capsule Take 2,000 Units by mouth daily.    [provider]  Cyanocobalamin  (VITAMIN B12) 1000 MCG TBCR Take 1,000 mcg by mouth See admin instructions. Take 1 tablet ( ) by mouth twice a week on Sunday and Friday. 03/09/20   [provider]  donepezil  (ARICEPT ) 10 MG tablet TAKE ONE TABLET BY MOUTH ONCE DAILY 05/14/23   Wertman, Sara E, PA-C  doxycycline  (VIBRAMYCIN ) 100 MG capsule Take 1 capsule (100 mg total) by mouth 2 (two) times daily. One po bid x 7 days 01/20/24   Emil Share, DO  folic acid  (FOLVITE ) 1 MG tablet Take 1 mg by mouth See admin instructions. Take 1 tablet (1mg ) by mouth daily, 6 days a week. Do not folic acid  on methotrexate  day (Wednesday).    [provider]  memantine  (NAMENDA ) 10 MG tablet Take 1 tablet  twice a day 10/06/23   Wertman, Sara E, PA-C  methotrexate  (RHEUMATREX) 2.5 MG tablet Take 10 mg by mouth See admin instructions. Take 4 tablets (10mg ) by mouth in the morning and in the evening on Wednesday only. Total dose of 20mg , once a week.    [provider]    Allergies: Patient has no known allergies.    Review of Systems  All other systems reviewed and are negative.   Updated Vital Signs BP (!) 162/54 (BP Location: Right Arm)   Pulse 67   Temp 97.6 F (36.4 C) (Oral)   Resp 16   SpO2 94%   Physical Exam Vitals and nursing note reviewed.  Constitutional:      General: She is not in acute distress.    Appearance: Normal appearance. She is well-developed. She is not toxic-appearing.  HENT:     Head: Normocephalic and atraumatic.  Eyes:     General: Lids are normal.     Conjunctiva/sclera: Conjunctivae normal.     Pupils: Pupils are equal, round, and reactive to light.  Neck:     Thyroid : No thyroid  mass.     Trachea: No tracheal deviation.  Cardiovascular:     Rate and Rhythm: Normal rate and regular rhythm.     Heart sounds: Normal heart sounds. No murmur  heard.    No gallop.  Pulmonary:     Effort: Pulmonary effort is normal. No respiratory distress.     Breath sounds: Normal breath sounds. No stridor. No decreased breath sounds, wheezing, rhonchi or rales.  Abdominal:     General: There is no distension.     Palpations: Abdomen is soft.     Tenderness: There is no abdominal tenderness. There is no rebound.  Musculoskeletal:        General: No tenderness.     Cervical back: Normal range of motion and neck supple.     Right hip: Decreased range of motion.     Comments: Right lower extremity shortened and internally rotated  Skin:    General: Skin is warm and dry.     Findings: No abrasion or rash.  Neurological:     General: No focal deficit present.     Mental Status: She is alert and oriented to person, place, and time. Mental status is at  baseline.     GCS: GCS eye subscore is 4. GCS verbal subscore is 5. GCS motor subscore is 6.     Cranial Nerves: No cranial nerve deficit.     Sensory: No sensory deficit.     Motor: Motor function is intact.  Psychiatric:        Attention and Perception: Attention normal.        Speech: Speech normal.        Behavior: Behavior normal.     (all labs ordered are listed, but only abnormal results are displayed) Labs Reviewed - No data to display  EKG: None  Radiology: No results found.   Procedures   Medications Ordered in the ED - No data to display                                  Medical Decision Making Amount and/or Complexity of Data Reviewed Labs: ordered. Radiology: ordered. ECG/medicine tests: ordered.  Risk Prescription drug management.   Patient's chest x-ray shows possible infiltrate although she does not have any respiratory symptoms at this time.  Will hold off on antibiotics.  CT of head and cervical spine showed no acute traumatic injuries.  Right hip and pelvis x-ray does show an acute minimal displaced intertrochanteric fracture right femur.  Case discussed with Dr. Kendal from orthopedics who states the patient can stay here at [facility] long.  Will admit to the hospitalist team     Final diagnoses:  None    ED Discharge Orders     None          Dasie Faden, MD 05/01/24 2107  "

## 2024-05-01 NOTE — ED Triage Notes (Signed)
 Pt coming from [facility] w/ c/o mechanical fall. Tripped over rug. C/o right hip pain, shortening and rotation. 100mcg fentanyl  given en route.

## 2024-05-01 NOTE — ED Notes (Signed)
 Called On call Orthopedic- Dr. kendal @9 :05 pm

## 2024-05-02 ENCOUNTER — Inpatient Hospital Stay (HOSPITAL_COMMUNITY): Admitting: Anesthesiology

## 2024-05-02 ENCOUNTER — Inpatient Hospital Stay (HOSPITAL_COMMUNITY)

## 2024-05-02 ENCOUNTER — Encounter (HOSPITAL_COMMUNITY)
Admission: EM | Disposition: A | Discharge status: Skilled Nursing Facility | Payer: Self-pay | Source: Home / Self Care | Attending: Internal Medicine

## 2024-05-02 DIAGNOSIS — S72141A Displaced intertrochanteric fracture of right femur, initial encounter for closed fracture: Secondary | ICD-10-CM

## 2024-05-02 DIAGNOSIS — Z66 Do not resuscitate: Secondary | ICD-10-CM | POA: Insufficient documentation

## 2024-05-02 DIAGNOSIS — I10 Essential (primary) hypertension: Secondary | ICD-10-CM | POA: Diagnosis not present

## 2024-05-02 DIAGNOSIS — E785 Hyperlipidemia, unspecified: Secondary | ICD-10-CM | POA: Diagnosis not present

## 2024-05-02 LAB — BASIC METABOLIC PANEL WITH GFR
Anion gap: 9 (ref 5–15)
BUN: 13 mg/dL (ref 8–23)
CO2: 27 mmol/L (ref 22–32)
Calcium: 9.2 mg/dL (ref 8.9–10.3)
Chloride: 103 mmol/L (ref 98–111)
Creatinine, Ser: 0.69 mg/dL (ref 0.44–1.00)
GFR, Estimated: >60 mL/min
Glucose, Bld: 122 mg/dL — ABNORMAL HIGH (ref 70–99)
Potassium: 4.4 mmol/L (ref 3.5–5.1)
Sodium: 138 mmol/L (ref 135–145)

## 2024-05-02 LAB — CBC
HCT: 35.8 % — ABNORMAL LOW (ref 36.0–46.0)
Hemoglobin: 11.9 g/dL — ABNORMAL LOW (ref 12.0–15.0)
MCH: 31.9 pg (ref 26.0–34.0)
MCHC: 33.2 g/dL (ref 30.0–36.0)
MCV: 96 fL (ref 80.0–100.0)
Platelets: 167 10*3/uL (ref 150–400)
RBC: 3.73 MIL/uL — ABNORMAL LOW (ref 3.87–5.11)
RDW: 14 % (ref 11.5–15.5)
WBC: 8 10*3/uL (ref 4.0–10.5)
nRBC: 0 % (ref 0.0–0.2)

## 2024-05-02 MED ORDER — 0.9 % SODIUM CHLORIDE (POUR BTL) OPTIME
TOPICAL | Status: DC | PRN
  Administered 2024-05-02: 1000 mL

## 2024-05-02 MED ORDER — OXYCODONE HCL 5 MG PO TABS: 2.5000 mg | ORAL_TABLET | ORAL | Status: DC | PRN

## 2024-05-02 MED ORDER — BUPIVACAINE IN DEXTROSE 0.75-8.25 % IT SOLN
INTRATHECAL | Status: DC | PRN
  Administered 2024-05-02: 1.8 mL via INTRATHECAL

## 2024-05-02 MED ORDER — ASPIRIN 81 MG PO CHEW
81.0000 mg | CHEWABLE_TABLET | Freq: Two times a day (BID) | ORAL | Status: DC
  Administered 2024-05-02 – 2024-05-04 (×4): 81 mg via ORAL
  Filled 2024-05-02 (×4): qty 1

## 2024-05-02 MED ORDER — OXYCODONE HCL 5 MG PO TABS: 5.0000 mg | ORAL_TABLET | ORAL | Status: DC | PRN

## 2024-05-02 MED ORDER — POLYETHYLENE GLYCOL 3350 17 G PO PACK
17.0000 g | PACK | Freq: Two times a day (BID) | ORAL | Status: DC
  Administered 2024-05-02 – 2024-05-03 (×2): 17 g via ORAL
  Filled 2024-05-02 (×3): qty 1

## 2024-05-02 MED ORDER — SODIUM CHLORIDE 0.9 % IV SOLN: INTRAVENOUS | Status: DC

## 2024-05-02 MED ORDER — CEFAZOLIN SODIUM-DEXTROSE 2-4 GM/100ML-% IV SOLN
INTRAVENOUS | Status: AC
  Filled 2024-05-02: qty 100

## 2024-05-02 MED ORDER — ACETAMINOPHEN 10 MG/ML IV SOLN
INTRAVENOUS | Status: AC
  Filled 2024-05-02: qty 100

## 2024-05-02 MED ORDER — DIPHENHYDRAMINE HCL 12.5 MG/5ML PO ELIX: 12.5000 mg | ORAL_SOLUTION | ORAL | Status: DC | PRN

## 2024-05-02 MED ORDER — PROPOFOL 10 MG/ML IV BOLUS
INTRAVENOUS | Status: DC | PRN
  Administered 2024-05-02 (×3): 10 mg via INTRAVENOUS

## 2024-05-02 MED ORDER — ACETAMINOPHEN 325 MG PO TABS
650.0000 mg | ORAL_TABLET | Freq: Four times a day (QID) | ORAL | Status: DC
  Administered 2024-05-02 – 2024-05-04 (×8): 650 mg via ORAL
  Filled 2024-05-02 (×8): qty 2

## 2024-05-02 MED ORDER — DEXAMETHASONE SODIUM PHOSPHATE 4 MG/ML IJ SOLN
INTRAMUSCULAR | Status: DC | PRN
  Administered 2024-05-02: 5 mg via INTRAVENOUS

## 2024-05-02 MED ORDER — TRANEXAMIC ACID-NACL 1000-0.7 MG/100ML-% IV SOLN
INTRAVENOUS | Status: AC
  Filled 2024-05-02: qty 100

## 2024-05-02 MED ORDER — TRANEXAMIC ACID-NACL 1000-0.7 MG/100ML-% IV SOLN
1000.0000 mg | Freq: Once | INTRAVENOUS | Status: AC
  Administered 2024-05-02: 1000 mg via INTRAVENOUS
  Filled 2024-05-02: qty 100

## 2024-05-02 MED ORDER — ONDANSETRON HCL 4 MG/2ML IJ SOLN
INTRAMUSCULAR | Status: DC | PRN
  Administered 2024-05-02: 4 mg via INTRAVENOUS

## 2024-05-02 MED ORDER — PROPOFOL 500 MG/50ML IV EMUL
INTRAVENOUS | Status: DC | PRN
  Administered 2024-05-02: 75 ug/kg/min via INTRAVENOUS

## 2024-05-02 MED ORDER — METHOCARBAMOL 1000 MG/10ML IJ SOLN: 500.0000 mg | Freq: Four times a day (QID) | INTRAMUSCULAR | Status: DC | PRN

## 2024-05-02 MED ORDER — LACTATED RINGERS IV SOLN: INTRAVENOUS | Status: DC | PRN

## 2024-05-02 MED ORDER — ONDANSETRON HCL 4 MG PO TABS: 4.0000 mg | ORAL_TABLET | Freq: Four times a day (QID) | ORAL | Status: DC | PRN

## 2024-05-02 MED ORDER — CHLORHEXIDINE GLUCONATE 4 % EX SOLN: 60.0000 mL | Freq: Once | CUTANEOUS | Status: DC

## 2024-05-02 MED ORDER — BISACODYL 10 MG RE SUPP: 10.0000 mg | Freq: Every day | RECTAL | Status: DC | PRN

## 2024-05-02 MED ORDER — PHENYLEPHRINE HCL-NACL 20-0.9 MG/250ML-% IV SOLN
INTRAVENOUS | Status: DC | PRN
  Administered 2024-05-02: 25 ug/min via INTRAVENOUS

## 2024-05-02 MED ORDER — MENTHOL 3 MG MT LOZG: 1.0000 | LOZENGE | OROMUCOSAL | Status: DC | PRN

## 2024-05-02 MED ORDER — PHENOL 1.4 % MT LIQD: 1.0000 | OROMUCOSAL | Status: DC | PRN

## 2024-05-02 MED ORDER — TRANEXAMIC ACID-NACL 1000-0.7 MG/100ML-% IV SOLN
1000.0000 mg | INTRAVENOUS | Status: AC
  Administered 2024-05-02: 1000 mg via INTRAVENOUS

## 2024-05-02 MED ORDER — POVIDONE-IODINE 10 % EX SWAB: 2.0000 | Freq: Once | CUTANEOUS | Status: DC

## 2024-05-02 MED ORDER — ONDANSETRON HCL 4 MG/2ML IJ SOLN: 4.0000 mg | Freq: Four times a day (QID) | INTRAMUSCULAR | Status: DC | PRN

## 2024-05-02 MED ORDER — ALUM & MAG HYDROXIDE-SIMETH 200-200-20 MG/5ML PO SUSP: 30.0000 mL | ORAL | Status: DC | PRN

## 2024-05-02 MED ORDER — CEFAZOLIN SODIUM-DEXTROSE 2-4 GM/100ML-% IV SOLN
2.0000 g | Freq: Four times a day (QID) | INTRAVENOUS | Status: AC
  Administered 2024-05-02 (×2): 2 g via INTRAVENOUS
  Filled 2024-05-02 (×2): qty 100

## 2024-05-02 MED ORDER — CEFAZOLIN SODIUM-DEXTROSE 2-4 GM/100ML-% IV SOLN
2.0000 g | INTRAVENOUS | Status: AC
  Administered 2024-05-02: 2 g via INTRAVENOUS

## 2024-05-02 MED ORDER — LIDOCAINE HCL (CARDIAC) PF 100 MG/5ML IV SOSY
PREFILLED_SYRINGE | INTRAVENOUS | Status: DC | PRN
  Administered 2024-05-02: 50 mg via INTRAVENOUS

## 2024-05-02 MED ORDER — HYDROMORPHONE HCL 1 MG/ML IJ SOLN: 0.5000 mg | INTRAMUSCULAR | Status: DC | PRN

## 2024-05-02 MED ORDER — METOCLOPRAMIDE HCL 5 MG/ML IJ SOLN: 5.0000 mg | Freq: Three times a day (TID) | INTRAMUSCULAR | Status: DC | PRN

## 2024-05-02 MED ORDER — FENTANYL CITRATE (PF) 50 MCG/ML IJ SOSY: 25.0000 ug | PREFILLED_SYRINGE | INTRAMUSCULAR | Status: DC | PRN

## 2024-05-02 MED ORDER — SENNA 8.6 MG PO TABS
2.0000 | ORAL_TABLET | Freq: Every day | ORAL | Status: DC
  Administered 2024-05-02: 17.2 mg via ORAL
  Filled 2024-05-02: qty 2

## 2024-05-02 MED ORDER — DEXAMETHASONE SOD PHOSPHATE PF 10 MG/ML IJ SOLN
10.0000 mg | Freq: Once | INTRAMUSCULAR | Status: AC
  Administered 2024-05-03: 10 mg via INTRAVENOUS
  Filled 2024-05-02: qty 1

## 2024-05-02 MED ORDER — ACETAMINOPHEN 10 MG/ML IV SOLN
INTRAVENOUS | Status: DC | PRN
  Administered 2024-05-02: 1000 mg via INTRAVENOUS

## 2024-05-02 MED ORDER — METOCLOPRAMIDE HCL 5 MG PO TABS: 5.0000 mg | ORAL_TABLET | Freq: Three times a day (TID) | ORAL | Status: DC | PRN

## 2024-05-02 MED ORDER — METHOCARBAMOL 500 MG PO TABS
500.0000 mg | ORAL_TABLET | Freq: Four times a day (QID) | ORAL | Status: DC | PRN
  Administered 2024-05-02: 500 mg via ORAL
  Filled 2024-05-02: qty 1

## 2024-05-02 NOTE — Assessment & Plan Note (Addendum)
 Mentation is at baseline per family Continue donepezil  and memantine  Fall and delirium precautions Patient lives at [facility], should be able to dc to [facility] at [facility] in the next 1-2 days

## 2024-05-02 NOTE — Brief Op Note (Signed)
 05/01/2024 - 05/02/2024 11:13 AM  12:17 PM  PATIENT:  Jil GORMAN Outhouse  89 y.o. female  PRE-OPERATIVE DIAGNOSIS:  RIGHT INTERTROCHANTERIC HIP FRACTURE  POST-OPERATIVE DIAGNOSIS:  RIGHT INTERTROCHANTERIC HIP FRACTURE  PROCEDURE:  Procedures: INSERTION, INTRAMEDULLARY ROD, FEMUR (Right)  SURGEON:  Surgeons and Role:    DEWAINE Ernie Cough, MD - Primary  PHYSICIAN ASSISTANT: Rosina Calin, PA-C  ANESTHESIA:   spinal  EBL:  <50 cc  BLOOD ADMINISTERED:none  DRAINS: none   LOCAL MEDICATIONS USED:  NONE  SPECIMEN:  No Specimen  DISPOSITION OF SPECIMEN:  N/A  COUNTS:  YES  TOURNIQUET:  * No tourniquets in log *  DICTATION: .Other Dictation: Dictation Number [id number]  PLAN OF CARE: Admit to inpatient   PATIENT DISPOSITION:  PACU - hemodynamically stable.   Delay start of Pharmacological VTE agent (>24hrs) due to surgical blood loss or risk of bleeding: no

## 2024-05-02 NOTE — Anesthesia Preprocedure Evaluation (Signed)
"                                    Anesthesia Evaluation  Patient identified by MRN, date of birth, ID band Patient awake    Reviewed: Allergy & Precautions, NPO status , Patient's Chart, lab work & pertinent test results  Airway Mallampati: Unable to assess       Dental  (+) Teeth Intact, Dental Advisory Given   Pulmonary    breath sounds clear to auscultation       Cardiovascular hypertension, Pt. on medications  Rhythm:Regular Rate:Normal     Neuro/Psych  PSYCHIATRIC DISORDERS     Dementia    GI/Hepatic negative GI ROS, Neg liver ROS,,,  Endo/Other  negative endocrine ROS    Renal/GU negative Renal ROS     Musculoskeletal  (+) Arthritis ,    Abdominal   Peds  Hematology negative hematology ROS (+)   Anesthesia Other Findings   Reproductive/Obstetrics                              Anesthesia Physical Anesthesia Plan  ASA: 3  Anesthesia Plan: Spinal   Post-op Pain Management: Ofirmev  IV (intra-op)*   Induction: Intravenous  PONV Risk Score and Plan: 3 and Ondansetron , Propofol  infusion and Treatment may vary due to age or medical condition  Airway Management Planned: Natural Airway and Simple Face Mask  Additional Equipment: None  Intra-op Plan:   Post-operative Plan:   Informed Consent: I have reviewed the patients History and Physical, chart, labs and discussed the procedure including the risks, benefits and alternatives for the proposed anesthesia with the patient or authorized representative who has indicated his/her understanding and acceptance.   Patient has DNR.  Discussed DNR with power of attorney and Suspend DNR.   Consent reviewed with POA  Plan Discussed with: CRNA  Anesthesia Plan Comments:         Anesthesia Quick Evaluation  "

## 2024-05-02 NOTE — Assessment & Plan Note (Addendum)
 Continue amlodipine 

## 2024-05-02 NOTE — Assessment & Plan Note (Addendum)
 Mechanical fall resulting in hip fracture Orthopedics consulted Underwent IM nail insertion on 2/1 SCDs overnight, start Lovenox  post-operatively (or as per ortho) Pain control with Tylenol , Robaxin , Oxycodone , and Morphine  prn TOC team consult for rehab placement Will need PT consult post-operatively Hip fracture order set utilized

## 2024-05-02 NOTE — Consult Note (Signed)
 Reason for Consult: right hip fracture Referring Physician: Barbarann, MD (Hospitalist)  Shannon Mcintyre is an 89 y.o. female.  HPI: Shannon Mcintyre is a 89 y.o. female with medical history significant for Alzheimer's dementia, HTN, psoriasis who presented to the ED for evaluation of right hip pain after fall.  History limited from patient due to dementia which is otherwise supplemented by EDP, chart review, and daughters at bedside.   Patient normally ambulates with the use of a rollator walker.  She was in an apartment earlier today with her caregiver when she tripped on a rug and fell onto her right hip.  She had significant right hip pain afterwards.  EMS were contacted.  She was noted to have shortening and rotation of her right lower extremity.  Past Medical History:  Diagnosis Date   AMS (altered mental status) 12/31/2023   Arthritis    Closed fracture of left olecranon process 07/28/2018   Diverticulosis of colon    Eczema    Hypertension    Left elbow fracture    S/P total knee replacement, left     Past Surgical History:  Procedure Laterality Date   ABDOMINAL HYSTERECTOMY  1980   BSO   APPENDECTOMY  1980   knee arthoscopy  2010   right   KNEE ARTHROSCOPY  2001   left   ORIF ELBOW FRACTURE Left 07/28/2018   Procedure: OPEN REDUCTION INTERNAL FIXATION (ORIF) ELBOW/OLECRANON FRACTURE;  Surgeon: Josefina Chew, MD;  Location: [facility];  Service: Orthopedics;  Laterality: Left;   TOTAL KNEE ARTHROPLASTY Left 08/12/2016   Procedure: LEFT TOTAL KNEE ARTHROPLASTY;  Surgeon: Jane Charleston, MD;  Location: [facility] OR;  Service: Orthopedics;  Laterality: Left;   TOTAL KNEE ARTHROPLASTY Right 06/28/2019   Procedure: TOTAL KNEE ARTHROPLASTY;  Surgeon: Jane Charleston, MD;  Location: [facility];  Service: Orthopedics;  Laterality: Right;    Family History  Problem Relation Age of Onset   Pneumonia Mother 28   Heart attack Father 40       Possibly CAD starting in his 82s    Parkinson's disease Brother     Social History:  reports that she has never smoked. She has never used smokeless tobacco. She reports that she does not drink alcohol and does not use drugs.  Allergies: Allergies[1]  Medications: I have reviewed the patient's current medications. Scheduled:  [MAR Hold] amLODipine   5 mg Oral QPM   chlorhexidine   60 mL Topical Once   [MAR Hold] donepezil   10 mg Oral QHS   [MAR Hold] memantine   10 mg Oral BID   povidone-iodine   2 Application Topical Once    Results for orders placed or performed during the hospital encounter of 05/01/24 (from the past 24 hours)  CBC with Differential/Platelet     Status: None   Collection Time: 05/01/24  8:08 PM  Result Value Ref Range   WBC 8.6 4.0 - 10.5 K/uL   RBC 4.03 3.87 - 5.11 MIL/uL   Hemoglobin 12.9 12.0 - 15.0 g/dL   HCT 62.1 63.9 - 53.9 %   MCV 93.8 80.0 - 100.0 fL   MCH 32.0 26.0 - 34.0 pg   MCHC 34.1 30.0 - 36.0 g/dL   RDW 85.6 88.4 - 84.4 %   Platelets 191 150 - 400 K/uL   nRBC 0.0 0.0 - 0.2 %   Neutrophils Relative % 85 %   Neutro Abs 7.3 1.7 - 7.7 K/uL   Lymphocytes Relative 10 %   Lymphs Abs  0.9 0.7 - 4.0 K/uL   Monocytes Relative 3 %   Monocytes Absolute 0.2 0.1 - 1.0 K/uL   Eosinophils Relative 1 %   Eosinophils Absolute 0.1 0.0 - 0.5 K/uL   Basophils Relative 0 %   Basophils Absolute 0.0 0.0 - 0.1 K/uL   Immature Granulocytes 1 %   Abs Immature Granulocytes 0.04 0.00 - 0.07 K/uL  Basic metabolic panel with GFR     Status: Abnormal   Collection Time: 05/01/24  8:08 PM  Result Value Ref Range   Sodium 138 135 - 145 mmol/L   Potassium 4.8 3.5 - 5.1 mmol/L   Chloride 104 98 - 111 mmol/L   CO2 24 22 - 32 mmol/L   Glucose, Bld 113 (H) 70 - 99 mg/dL   BUN 17 8 - 23 mg/dL   Creatinine, Ser 9.18 0.44 - 1.00 mg/dL   Calcium 9.2 8.9 - 89.6 mg/dL   GFR, Estimated >39 >39 mL/min   Anion gap 10 5 - 15  CK     Status: None   Collection Time: 05/01/24  8:08 PM  Result Value Ref Range   Total  CK 44 38 - 234 U/L  Type and screen     Status: None   Collection Time: 05/01/24  8:08 PM  Result Value Ref Range   ABO/RH(D) A POS    Antibody Screen NEG    Sample Expiration      05/04/2024,2359 Performed at [facility], [address] [place], [place] [address]   Urinalysis, w/ Reflex to Culture (Infection Suspected) -Urine, Clean Catch     Status: None   Collection Time: 05/01/24  9:00 PM  Result Value Ref Range   Specimen Source URINE, CLEAN CATCH    Color, Urine YELLOW YELLOW   APPearance CLEAR CLEAR   Specific Gravity, Urine 1.019 1.005 - 1.030   pH 5.0 5.0 - 8.0   Glucose, UA NEGATIVE NEGATIVE mg/dL   Hgb urine dipstick NEGATIVE NEGATIVE   Bilirubin Urine NEGATIVE NEGATIVE   Ketones, ur NEGATIVE NEGATIVE mg/dL   Protein, ur NEGATIVE NEGATIVE mg/dL   Nitrite NEGATIVE NEGATIVE   Leukocytes,Ua NEGATIVE NEGATIVE   RBC / HPF 0-5 0 - 5 RBC/hpf   WBC, UA 0-5 0 - 5 WBC/hpf   Bacteria, UA NONE SEEN NONE SEEN   Squamous Epithelial / HPF 0-5 0 - 5 /HPF   Mucus PRESENT   Basic metabolic panel     Status: Abnormal   Collection Time: 05/02/24  3:55 AM  Result Value Ref Range   Sodium 138 135 - 145 mmol/L   Potassium 4.4 3.5 - 5.1 mmol/L   Chloride 103 98 - 111 mmol/L   CO2 27 22 - 32 mmol/L   Glucose, Bld 122 (H) 70 - 99 mg/dL   BUN 13 8 - 23 mg/dL   Creatinine, Ser 9.30 0.44 - 1.00 mg/dL   Calcium 9.2 8.9 - 89.6 mg/dL   GFR, Estimated >39 >39 mL/min   Anion gap 9 5 - 15  CBC     Status: Abnormal   Collection Time: 05/02/24  3:55 AM  Result Value Ref Range   WBC 8.0 4.0 - 10.5 K/uL   RBC 3.73 (L) 3.87 - 5.11 MIL/uL   Hemoglobin 11.9 (L) 12.0 - 15.0 g/dL   HCT 64.1 (L) 63.9 - 53.9 %   MCV 96.0 80.0 - 100.0 fL   MCH 31.9 26.0 - 34.0 pg   MCHC 33.2 30.0 - 36.0 g/dL  RDW 14.0 11.5 - 15.5 %   Platelets 167 150 - 400 K/uL   nRBC 0.0 0.0 - 0.2 %     X-ray: EXAM: 2 or 3 VIEW(S) XRAY OF THE RIGHT HIP 05/01/2024 07:48:12 PM   COMPARISON: None  available.   CLINICAL HISTORY: Fall. Fall.   FINDINGS:   BONES AND JOINTS: Acute minimally displaced intertrochanteric fracture of the right femur.   SOFT TISSUES: Unremarkable.   IMPRESSION: 1. Acute minimally displaced intertrochanteric fracture of the right femur.   Electronically signed by: Norman Gatlin MD 05/01/2024 07:51 PM EST RP  ROS: As per HPI  Blood pressure (!) 159/52, pulse 72, temperature 98.9 F (37.2 C), temperature source Oral, resp. rate 16, height 5' 6 (1.676 m), weight 58.3 kg, SpO2 94%.  Physical Exam: Constitutional: Elderly woman resting supine in bed, NAD, calm, comfortable Eyes: EOMI, lids and conjunctivae normal ENMT: Hard of hearing.  Mucous membranes are moist. Posterior pharynx clear of any exudate or lesions.Normal dentition.  Neck: normal, supple, no masses. Respiratory: clear to auscultation anteriorly. Normal respiratory effort. No accessory muscle use.  Cardiovascular: Regular rate and rhythm, no murmurs / rubs / gallops. No extremity edema. 2+ pedal pulses. Abdomen: no tenderness, no masses palpated. Musculoskeletal: Diminished ROM RLE due to right hip fracture, right foot everted Skin: no rashes, lesions, ulcers. No induration Neurologic: Sensation intact. Strength diminished RLE due to right hip fracture otherwise intact Psychiatric: Awake, alert, oriented to self only  Assessment/Plan: Closed right IT hip fracture  Plan: We will plan to take her to the OR today to fix her right hip fracture NPO Orders placed Post op orders to follow based on stability of her fracture  Donnice JONETTA Car 05/02/2024, 11:41 AM         [1] No Known Allergies

## 2024-05-02 NOTE — H&P (View-Only) (Signed)
 Reason for Consult: right hip fracture Referring Physician: Barbarann, MD (Hospitalist)  Shannon Mcintyre is an 89 y.o. female.  HPI: Shannon Mcintyre is a 89 y.o. female with medical history significant for Alzheimer's dementia, HTN, psoriasis who presented to the ED for evaluation of right hip pain after fall.  History limited from patient due to dementia which is otherwise supplemented by EDP, chart review, and daughters at bedside.   Patient normally ambulates with the use of a rollator walker.  She was in an apartment earlier today with her caregiver when she tripped on a rug and fell onto her right hip.  She had significant right hip pain afterwards.  EMS were contacted.  She was noted to have shortening and rotation of her right lower extremity.  Past Medical History:  Diagnosis Date   AMS (altered mental status) 12/31/2023   Arthritis    Closed fracture of left olecranon process 07/28/2018   Diverticulosis of colon    Eczema    Hypertension    Left elbow fracture    S/P total knee replacement, left     Past Surgical History:  Procedure Laterality Date   ABDOMINAL HYSTERECTOMY  1980   BSO   APPENDECTOMY  1980   knee arthoscopy  2010   right   KNEE ARTHROSCOPY  2001   left   ORIF ELBOW FRACTURE Left 07/28/2018   Procedure: OPEN REDUCTION INTERNAL FIXATION (ORIF) ELBOW/OLECRANON FRACTURE;  Surgeon: Josefina Chew, MD;  Location: [facility];  Service: Orthopedics;  Laterality: Left;   TOTAL KNEE ARTHROPLASTY Left 08/12/2016   Procedure: LEFT TOTAL KNEE ARTHROPLASTY;  Surgeon: Jane Charleston, MD;  Location: [facility] OR;  Service: Orthopedics;  Laterality: Left;   TOTAL KNEE ARTHROPLASTY Right 06/28/2019   Procedure: TOTAL KNEE ARTHROPLASTY;  Surgeon: Jane Charleston, MD;  Location: [facility];  Service: Orthopedics;  Laterality: Right;    Family History  Problem Relation Age of Onset   Pneumonia Mother 28   Heart attack Father 40       Possibly CAD starting in his 82s    Parkinson's disease Brother     Social History:  reports that she has never smoked. She has never used smokeless tobacco. She reports that she does not drink alcohol and does not use drugs.  Allergies: Allergies[1]  Medications: I have reviewed the patient's current medications. Scheduled:  [MAR Hold] amLODipine   5 mg Oral QPM   chlorhexidine   60 mL Topical Once   [MAR Hold] donepezil   10 mg Oral QHS   [MAR Hold] memantine   10 mg Oral BID   povidone-iodine   2 Application Topical Once    Results for orders placed or performed during the hospital encounter of 05/01/24 (from the past 24 hours)  CBC with Differential/Platelet     Status: None   Collection Time: 05/01/24  8:08 PM  Result Value Ref Range   WBC 8.6 4.0 - 10.5 K/uL   RBC 4.03 3.87 - 5.11 MIL/uL   Hemoglobin 12.9 12.0 - 15.0 g/dL   HCT 62.1 63.9 - 53.9 %   MCV 93.8 80.0 - 100.0 fL   MCH 32.0 26.0 - 34.0 pg   MCHC 34.1 30.0 - 36.0 g/dL   RDW 85.6 88.4 - 84.4 %   Platelets 191 150 - 400 K/uL   nRBC 0.0 0.0 - 0.2 %   Neutrophils Relative % 85 %   Neutro Abs 7.3 1.7 - 7.7 K/uL   Lymphocytes Relative 10 %   Lymphs Abs  0.9 0.7 - 4.0 K/uL   Monocytes Relative 3 %   Monocytes Absolute 0.2 0.1 - 1.0 K/uL   Eosinophils Relative 1 %   Eosinophils Absolute 0.1 0.0 - 0.5 K/uL   Basophils Relative 0 %   Basophils Absolute 0.0 0.0 - 0.1 K/uL   Immature Granulocytes 1 %   Abs Immature Granulocytes 0.04 0.00 - 0.07 K/uL  Basic metabolic panel with GFR     Status: Abnormal   Collection Time: 05/01/24  8:08 PM  Result Value Ref Range   Sodium 138 135 - 145 mmol/L   Potassium 4.8 3.5 - 5.1 mmol/L   Chloride 104 98 - 111 mmol/L   CO2 24 22 - 32 mmol/L   Glucose, Bld 113 (H) 70 - 99 mg/dL   BUN 17 8 - 23 mg/dL   Creatinine, Ser 9.18 0.44 - 1.00 mg/dL   Calcium 9.2 8.9 - 89.6 mg/dL   GFR, Estimated >39 >39 mL/min   Anion gap 10 5 - 15  CK     Status: None   Collection Time: 05/01/24  8:08 PM  Result Value Ref Range   Total  CK 44 38 - 234 U/L  Type and screen     Status: None   Collection Time: 05/01/24  8:08 PM  Result Value Ref Range   ABO/RH(D) A POS    Antibody Screen NEG    Sample Expiration      05/04/2024,2359 Performed at [facility], [address] [place], [place] [address]   Urinalysis, w/ Reflex to Culture (Infection Suspected) -Urine, Clean Catch     Status: None   Collection Time: 05/01/24  9:00 PM  Result Value Ref Range   Specimen Source URINE, CLEAN CATCH    Color, Urine YELLOW YELLOW   APPearance CLEAR CLEAR   Specific Gravity, Urine 1.019 1.005 - 1.030   pH 5.0 5.0 - 8.0   Glucose, UA NEGATIVE NEGATIVE mg/dL   Hgb urine dipstick NEGATIVE NEGATIVE   Bilirubin Urine NEGATIVE NEGATIVE   Ketones, ur NEGATIVE NEGATIVE mg/dL   Protein, ur NEGATIVE NEGATIVE mg/dL   Nitrite NEGATIVE NEGATIVE   Leukocytes,Ua NEGATIVE NEGATIVE   RBC / HPF 0-5 0 - 5 RBC/hpf   WBC, UA 0-5 0 - 5 WBC/hpf   Bacteria, UA NONE SEEN NONE SEEN   Squamous Epithelial / HPF 0-5 0 - 5 /HPF   Mucus PRESENT   Basic metabolic panel     Status: Abnormal   Collection Time: 05/02/24  3:55 AM  Result Value Ref Range   Sodium 138 135 - 145 mmol/L   Potassium 4.4 3.5 - 5.1 mmol/L   Chloride 103 98 - 111 mmol/L   CO2 27 22 - 32 mmol/L   Glucose, Bld 122 (H) 70 - 99 mg/dL   BUN 13 8 - 23 mg/dL   Creatinine, Ser 9.30 0.44 - 1.00 mg/dL   Calcium 9.2 8.9 - 89.6 mg/dL   GFR, Estimated >39 >39 mL/min   Anion gap 9 5 - 15  CBC     Status: Abnormal   Collection Time: 05/02/24  3:55 AM  Result Value Ref Range   WBC 8.0 4.0 - 10.5 K/uL   RBC 3.73 (L) 3.87 - 5.11 MIL/uL   Hemoglobin 11.9 (L) 12.0 - 15.0 g/dL   HCT 64.1 (L) 63.9 - 53.9 %   MCV 96.0 80.0 - 100.0 fL   MCH 31.9 26.0 - 34.0 pg   MCHC 33.2 30.0 - 36.0 g/dL  RDW 14.0 11.5 - 15.5 %   Platelets 167 150 - 400 K/uL   nRBC 0.0 0.0 - 0.2 %     X-ray: EXAM: 2 or 3 VIEW(S) XRAY OF THE RIGHT HIP 05/01/2024 07:48:12 PM   COMPARISON: None  available.   CLINICAL HISTORY: Fall. Fall.   FINDINGS:   BONES AND JOINTS: Acute minimally displaced intertrochanteric fracture of the right femur.   SOFT TISSUES: Unremarkable.   IMPRESSION: 1. Acute minimally displaced intertrochanteric fracture of the right femur.   Electronically signed by: Norman Gatlin MD 05/01/2024 07:51 PM EST RP  ROS: As per HPI  Blood pressure (!) 159/52, pulse 72, temperature 98.9 F (37.2 C), temperature source Oral, resp. rate 16, height 5' 6 (1.676 m), weight 58.3 kg, SpO2 94%.  Physical Exam: Constitutional: Elderly woman resting supine in bed, NAD, calm, comfortable Eyes: EOMI, lids and conjunctivae normal ENMT: Hard of hearing.  Mucous membranes are moist. Posterior pharynx clear of any exudate or lesions.Normal dentition.  Neck: normal, supple, no masses. Respiratory: clear to auscultation anteriorly. Normal respiratory effort. No accessory muscle use.  Cardiovascular: Regular rate and rhythm, no murmurs / rubs / gallops. No extremity edema. 2+ pedal pulses. Abdomen: no tenderness, no masses palpated. Musculoskeletal: Diminished ROM RLE due to right hip fracture, right foot everted Skin: no rashes, lesions, ulcers. No induration Neurologic: Sensation intact. Strength diminished RLE due to right hip fracture otherwise intact Psychiatric: Awake, alert, oriented to self only  Assessment/Plan: Closed right IT hip fracture  Plan: We will plan to take her to the OR today to fix her right hip fracture NPO Orders placed Post op orders to follow based on stability of her fracture  Donnice JONETTA Car 05/02/2024, 11:41 AM         [1] No Known Allergies

## 2024-05-02 NOTE — Plan of Care (Signed)

## 2024-05-02 NOTE — Assessment & Plan Note (Signed)
 DNR confirmed at the time of admission Patient will need a gold out of facility DNR form at the time of discharge

## 2024-05-02 NOTE — Interval H&P Note (Signed)
 History and Physical Interval Note:  05/02/2024 11:44 AM  Shannon Mcintyre  has presented today for surgery, with the diagnosis of RIGHT INTERTROCHANTERIC HIP FRACTURE.  The various methods of treatment have been discussed with the patient and family. After consideration of risks, benefits and other options for treatment, the patient has consented to  Procedures: INSERTION, INTRAMEDULLARY ROD, FEMUR (Right) as a surgical intervention.  The patient's history has been reviewed, patient examined, no change in status, stable for surgery.  I have reviewed the patient's chart and labs.  Questions were answered to the patient's satisfaction.     Shannon Mcintyre

## 2024-05-03 ENCOUNTER — Encounter (HOSPITAL_COMMUNITY): Payer: Self-pay | Admitting: Orthopedic Surgery

## 2024-05-03 ENCOUNTER — Other Ambulatory Visit: Payer: Self-pay

## 2024-05-03 DIAGNOSIS — S72141A Displaced intertrochanteric fracture of right femur, initial encounter for closed fracture: Principal | ICD-10-CM

## 2024-05-03 LAB — BASIC METABOLIC PANEL WITH GFR
Anion gap: 10 (ref 5–15)
BUN: 14 mg/dL (ref 8–23)
CO2: 25 mmol/L (ref 22–32)
Calcium: 9.2 mg/dL (ref 8.9–10.3)
Chloride: 106 mmol/L (ref 98–111)
Creatinine, Ser: 0.85 mg/dL (ref 0.44–1.00)
GFR, Estimated: >60 mL/min
Glucose, Bld: 140 mg/dL — ABNORMAL HIGH (ref 70–99)
Potassium: 4.3 mmol/L (ref 3.5–5.1)
Sodium: 140 mmol/L (ref 135–145)

## 2024-05-03 LAB — CBC
HCT: 31.8 % — ABNORMAL LOW (ref 36.0–46.0)
Hemoglobin: 11.1 g/dL — ABNORMAL LOW (ref 12.0–15.0)
MCH: 32 pg (ref 26.0–34.0)
MCHC: 34.9 g/dL (ref 30.0–36.0)
MCV: 91.6 fL (ref 80.0–100.0)
Platelets: 167 10*3/uL (ref 150–400)
RBC: 3.47 MIL/uL — ABNORMAL LOW (ref 3.87–5.11)
RDW: 13.8 % (ref 11.5–15.5)
WBC: 6 10*3/uL (ref 4.0–10.5)
nRBC: 0 % (ref 0.0–0.2)

## 2024-05-03 MED ORDER — CHLORHEXIDINE GLUCONATE CLOTH 2 % EX PADS
6.0000 | MEDICATED_PAD | Freq: Every day | CUTANEOUS | Status: DC
  Administered 2024-05-03: 6 via TOPICAL

## 2024-05-03 MED ORDER — ADULT MULTIVITAMIN W/MINERALS CH
1.0000 | ORAL_TABLET | Freq: Every day | ORAL | Status: DC
  Administered 2024-05-04: 1 via ORAL
  Filled 2024-05-03: qty 1

## 2024-05-03 MED ORDER — ENSURE PLUS HIGH PROTEIN PO LIQD
237.0000 mL | Freq: Two times a day (BID) | ORAL | Status: DC
  Administered 2024-05-03 – 2024-05-04 (×2): 237 mL via ORAL

## 2024-05-03 NOTE — Progress Notes (Signed)
 Patient ID: Shannon Mcintyre, female   DOB: December 12, 1932, 89 y.o.   MRN: [id number] Subjective: 1 Day Post-Op Procedures (LRB): INSERTION, INTRAMEDULLARY ROD, FEMUR (Right)    Patient reports pain as mild. Her daughters are at her bedside.  They report that she was able to get some sleep last night No events noted  Objective:   VITALS:   Vitals:   05/03/24 0300 05/03/24 0642  BP: (!) 146/66 132/64  Pulse: (!) 50 (!) 55  Resp: 16 18  Temp: 97.7 F (36.5 C) 97.7 F (36.5 C)  SpO2: 98% 96%    Neurovascular intact Incision: dressing C/D/I  LABS Recent Labs    05/01/24 2008 05/02/24 0355 05/03/24 0329  HGB 12.9 11.9* 11.1*  HCT 37.8 35.8* 31.8*  WBC 8.6 8.0 6.0  PLT 191 167 167    Recent Labs    05/01/24 2008 05/02/24 0355 05/03/24 0329  NA 138 138 140  K 4.8 4.4 4.3  BUN 17 13 14   CREATININE 0.81 0.69 0.85  GLUCOSE 113* 122* 140*    No results for input(s): LABPT, INR in the last 72 hours.   Assessment/Plan: 1 Day Post-Op Procedures (LRB): INSERTION, INTRAMEDULLARY ROD, FEMUR (Right)   Advance diet Up with therapy WBAT RLE based on fracture morphology and stability of repair Disposition - she will be transferred to [facility] nursing care when stable medically RTC in 2 weeks ASA for DVT prophylaxis Minimize use of narcotics to reduce chances of acute delirium

## 2024-05-03 NOTE — Assessment & Plan Note (Addendum)
 Mentation is at baseline per family Continue donepezil  and memantine  Fall and delirium precautions Patient lives at [facility], should be able to dc to STR at [facility] in the next 1-2 days

## 2024-05-03 NOTE — Op Note (Signed)
 NAME: Shannon Mcintyre, REHMANN MEDICAL RECORD NO: [id number] ACCOUNT NO: [id number] DATE OF BIRTH: [birth year] FACILITY: [facility] LOCATION: [facility] PHYSICIAN: Donnice BIRCH. Ernie, MD  Operative Report   DATE OF PROCEDURE: 05/02/2024  PREOPERATIVE DIAGNOSIS:  Right intertrochanteric femur fracture.  POSTOPERATIVE DIAGNOSIS:  Right intertrochanteric femur fracture.  PROCEDURE:  Trochanteric femoral nailing of right hip fracture utilizing a Zimmer A-fixus nail 11 x 180 mm with a 95 mm lag screw and a 32 mm distal interlock.  SURGEON:  Donnice BIRCH. Ernie, MD  ASSISTANT:  Rosina Calin, PA-C. Note that PA Calin was present for the entirety of the case from preoperative positioning and setup, general facilitation of the case, and primary wound closure.  ANESTHESIA:  Spinal.  BLOOD LOSS:  Less than 50 mL.  DRAINS:  None.  COMPLICATIONS:  None.  INDICATIONS FOR THE PROCEDURE:  The patient is a pleasant 89 year old female who resides at [facility].  She is currently in independent living but does have some history of dementia.  She is very well cared for.  She unfortunately had a fall and landed  on her right hip.  She had a right intertrochanteric femur fracture.  She was admitted to the hospital.  Orthopedics was consulted for management.  I reviewed with her daughters the indication of the procedure.  There is risks of infection, DVT,  malunion, nonunion, and need for future surgeries.  Consent was obtained for management of her fracture.  DESCRIPTION OF PROCEDURE:  The patient was brought to the operative theater.  Once adequate anesthesia, preoperative antibiotics, Ancef  administered as well as tranexamic acid , she was positioned on the Hana table.  A right leg was placed in the traction  boot.  All of her bony prominences were carefully protected and padded.  Her left leg was flexed and abducted out of the way with the lateral aspect of her knee protected from compression against the peroneal nerve.   Fluoroscopy was used to confirm  reduction of the fracture with traction and internal rotation.  The fracture was reduced into a near anatomic position.  At this point, the right hip was prepped and draped in sterile fashion using the shower curtain technique.  A timeout was performed  identifying the patient, planned procedure, and extremity.  Fluoroscopy was brought back to the field.  Landmarks were identified.  An incision was made over the proximal lateral aspect of the hip.  Dissection was carried through the gluteal fascia.  A  guide wire was inserted into the tip of the trochanter and passed across the fracture site confirmed radiographically.  I then used a starting drill and opened up the proximal femur.  We then passed the intramedullary nail by hand to its appropriate  depth.  Then using the insertion jig, we passed a guide wire into the center of the femoral head in the AP and lateral planes.  I measured and selected a 95 mm lag screw.  We drilled for this and passed the lag screw.  With the lag screw at the lateral  cortex of the femur, we took contraction off the leg and used the compression wheel to medialize the fracture to the shaft of the femur.  Once this was done, I tightened the proximal locking bolt and turned it back a quarter of a turn for compression.  A  distal interlock was in place through the insertion jig.  Final radiographs were obtained.  All wounds were irrigated.  The proximal wound was closed in layers using a #1  Vicryl on the gluteal fascia followed by #2-0 Vicryl on running stitches on all  the wounds.  All wounds were cleaned, dried, and dressed sterilely.  The patient was brought to the recovery room in stable condition and tolerated the procedure well.  We will allow her to be weight bearing as tolerated based on the fracture stability.     D: 05/02/2024 1:17:58 pm T: 05/02/2024 1:19:00 pm  JOB: [id number]/ [id number]

## 2024-05-03 NOTE — Progress Notes (Signed)
 Initial Nutrition Assessment  DOCUMENTATION CODES:   Not applicable  INTERVENTION:  - Regular diet.  - Ensure Plus High Protein po BID, each supplement provides 350 kcal and 20 grams of protein. - Encourage intake at all meals and of supplements.  - Multivitamin with minerals daily.  - Monitor weight trends.  NUTRITION DIAGNOSIS:   Increased nutrient needs related to hip fracture as evidenced by estimated needs.  GOAL:   Patient will meet greater than or equal to 90% of their needs  MONITOR:   PO intake, Supplement acceptance, Weight trends  REASON FOR ASSESSMENT:   Consult Hip fracture protocol  ASSESSMENT:   89 y.o. female with PMH significant for Alzheimer's dementia, HTN, psoriasis who presented for evaluation of right hip pain after fall. Admitted for right femur fracture.    RD working remotely. Patient noted to have dementia, unable to obtain nutrition history at this time.   Per review of EMR, patient has had a 14# or 10% weight loss over the past 11.5 months, which is not significant for the time frame.   Patient is documented to be consuming 50-100% of meals so far this admission. Will add Ensure to support oral intake.   Medications reviewed and include: Namenda , Miralax , Senokot  Labs reviewed:  -  NUTRITION - FOCUSED PHYSICAL EXAM:  RD working remotely  Diet Order:   Diet Order             Diet regular Room service appropriate? Yes; Fluid consistency: Thin  Diet effective now                   EDUCATION NEEDS:  Not appropriate for education at this time  Skin:  Skin Assessment: Skin Integrity Issues: Skin Integrity Issues:: Incisions Incisions: Right Leg  Last BM:  2/2 - type 5  Height:  Ht Readings from Last 1 Encounters:  05/02/24 5' 6 (1.676 m)   Weight:  Wt Readings from Last 1 Encounters:  05/02/24 58.3 kg    BMI:  Body mass index is 20.74 kg/m.  Estimated Nutritional Needs:  Kcal:  1450-1750 kcals Protein:   65-75 grams Fluid:  >/= 1.5L    Trude Ned RD, LDN Contact via Secure Chat.

## 2024-05-03 NOTE — Assessment & Plan Note (Addendum)
 Continue amlodipine 

## 2024-05-03 NOTE — TOC Initial Note (Signed)
 Transition of Care [facility]) - Initial/Assessment Note    Patient Details  Name: Shannon Mcintyre MRN: [id number] Date of Birth: 1933-03-12  Transition of Care [facility]) CM/SW Contact:    Alfonse JONELLE Rex, RN Phone Number: 05/03/2024, 4:51 PM  Clinical Narrative:    Patient resides at [facility], PT eval completed, recommendation for short term rehab, per beside nurse, pt/family request rehab at [facility]. CM call to [facility], admit coordinator w/[facility], confirmed patient is an ILF resident, able to admit to rehab unit at facility. Will need updated FL2 and DC Summary. FL2 updated, CM following for dc.                Expected Discharge Plan: Skilled Nursing Facility Barriers to Discharge: Continued Medical Work up   Patient Goals and CMS Choice   CMS Medicare.gov Compare Post Acute Care list provided to:: Patient Represenative (must comment) Jacquelene Odor  Daughter, Emergency Contact  [phone] (Mobile)) Choice offered to / list presented to : Adult Children [place] ownership interest in [facility].provided to:: Adult Children    Expected Discharge Plan and Services     Post Acute Care Choice: Skilled Nursing Facility ([facility]) Living arrangements for the past 2 months: Independent Living Facility ([facility])                                      Prior Living Arrangements/Services Living arrangements for the past 2 months: Independent Living Facility ([facility]) Lives with:: Self Patient language and need for interpreter reviewed:: Yes        Need for Family Participation in Patient Care: Yes (Comment) Care giver support system in place?: Yes (comment)   Criminal Activity/Legal Involvement Pertinent to Current Situation/Hospitalization: No - Comment as needed  Activities of Daily Living   ADL Screening (condition at time of admission) Independently performs ADLs?: No Does the patient have a NEW difficulty with  bathing/dressing/toileting/self-feeding that is expected to last >3 days?: Yes (Initiates electronic notice to provider for possible OT consult) Does the patient have a NEW difficulty with getting in/out of bed, walking, or climbing stairs that is expected to last >3 days?: Yes (Initiates electronic notice to provider for possible PT consult) Does the patient have a NEW difficulty with communication that is expected to last >3 days?: No Is the patient deaf or have difficulty hearing?: Yes Does the patient have difficulty seeing, even when wearing glasses/contacts?: Yes Does the patient have difficulty concentrating, remembering, or making decisions?: Yes  Permission Sought/Granted                  Emotional Assessment       Orientation: : Oriented to Self Alcohol / Substance Use: Not Applicable Psych Involvement: No (comment)  Admission diagnosis:  Closed fracture of right hip, initial encounter (HCC) [S72.001A] Intertrochanteric fracture of right femur, closed, initial encounter [facility]) [D27.858J] Patient Active Problem List   Diagnosis Date Noted   DNR (do not resuscitate) 05/02/2024   Intertrochanteric fracture of right femur, closed, initial encounter (HCC) 05/01/2024   Alzheimer's dementia without behavioral disturbance (HCC) 05/01/2024   S/P total knee replacement, left    Closed fracture of left olecranon process 07/28/2018   Primary localized osteoarthritis of right knee    Vocal cord atrophy 11/22/2013   Hyperlipidemia 11/12/2011   Dysphonia 02/18/2011   Eczema 11/05/2010   Essential hypertension 08/01/2009   ARTHRITIS 03/29/2008  DIVERTICULOSIS, COLON 09/17/2006   PCP:  Charlott Dorn LABOR, MD Pharmacy:   [facility] [place], [place] - [address] [place] [address] Phone: [phone] Fax: [phone]     Social Drivers of Health (SDOH) Social History: SDOH Screenings   Food Insecurity: No  Food Insecurity (05/02/2024)  Housing: Low Risk (05/02/2024)  Transportation Needs: No Transportation Needs (05/03/2024)  Utilities: Not At Risk (05/02/2024)  Social Connections: Socially Isolated (05/03/2024)  Tobacco Use: Low Risk (05/01/2024)   SDOH Interventions:     Readmission Risk Interventions    05/03/2024    4:49 PM  Readmission Risk Prevention Plan  Post Dischage Appt Complete  Medication Screening Complete  Transportation Screening Complete

## 2024-05-03 NOTE — Assessment & Plan Note (Addendum)
 Mechanical fall resulting in hip fracture Orthopedics consulted Underwent IM nail insertion on 2/1 SCDs overnight, start Lovenox  post-operatively (or as per ortho) Pain control with Tylenol , Robaxin , Oxycodone , and Dilaudid  prn TOC team consult for rehab placement She has been seen by PT and is recommended for STR

## 2024-05-03 NOTE — Assessment & Plan Note (Addendum)
 DNR confirmed at the time of admission Patient will need a gold out of facility DNR form at the time of discharge

## 2024-05-04 DIAGNOSIS — R638 Other symptoms and signs concerning food and fluid intake: Secondary | ICD-10-CM | POA: Insufficient documentation

## 2024-05-04 DIAGNOSIS — S72141A Displaced intertrochanteric fracture of right femur, initial encounter for closed fracture: Secondary | ICD-10-CM | POA: Diagnosis not present

## 2024-05-04 DIAGNOSIS — D62 Acute posthemorrhagic anemia: Secondary | ICD-10-CM | POA: Insufficient documentation

## 2024-05-04 LAB — BASIC METABOLIC PANEL WITH GFR
Anion gap: 9 (ref 5–15)
BUN: 26 mg/dL — ABNORMAL HIGH (ref 8–23)
CO2: 24 mmol/L (ref 22–32)
Calcium: 8.9 mg/dL (ref 8.9–10.3)
Chloride: 107 mmol/L (ref 98–111)
Creatinine, Ser: 0.89 mg/dL (ref 0.44–1.00)
GFR, Estimated: >60 mL/min
Glucose, Bld: 139 mg/dL — ABNORMAL HIGH (ref 70–99)
Potassium: 4.1 mmol/L (ref 3.5–5.1)
Sodium: 141 mmol/L (ref 135–145)

## 2024-05-04 LAB — CBC
HCT: 29.9 % — ABNORMAL LOW (ref 36.0–46.0)
Hemoglobin: 10.1 g/dL — ABNORMAL LOW (ref 12.0–15.0)
MCH: 32.3 pg (ref 26.0–34.0)
MCHC: 33.8 g/dL (ref 30.0–36.0)
MCV: 95.5 fL (ref 80.0–100.0)
Platelets: 171 10*3/uL (ref 150–400)
RBC: 3.13 MIL/uL — ABNORMAL LOW (ref 3.87–5.11)
RDW: 14.2 % (ref 11.5–15.5)
WBC: 12 10*3/uL — ABNORMAL HIGH (ref 4.0–10.5)
nRBC: 0.2 % (ref 0.0–0.2)

## 2024-05-04 MED ORDER — ENSURE PLUS HIGH PROTEIN PO LIQD: 237.0000 mL | Freq: Two times a day (BID) | ORAL | Status: AC

## 2024-05-04 MED ORDER — ASPIRIN 81 MG PO CHEW: 81.0000 mg | CHEWABLE_TABLET | Freq: Two times a day (BID) | ORAL | 0 refills | Status: AC

## 2024-05-04 MED ORDER — POLYETHYLENE GLYCOL 3350 17 G PO PACK: 17.0000 g | PACK | Freq: Two times a day (BID) | ORAL | 0 refills | Status: AC

## 2024-05-04 MED ORDER — METHOCARBAMOL 500 MG PO TABS: 500.0000 mg | ORAL_TABLET | Freq: Three times a day (TID) | ORAL | 0 refills | Status: AC | PRN

## 2024-05-04 MED ORDER — OXYCODONE HCL 5 MG PO TABS: 2.5000 mg | ORAL_TABLET | ORAL | 0 refills | Status: AC | PRN

## 2024-05-04 MED ORDER — ADULT MULTIVITAMIN W/MINERALS CH: 1.0000 | ORAL_TABLET | Freq: Every day | ORAL | Status: AC

## 2024-05-04 MED ORDER — ACETAMINOPHEN 325 MG PO TABS: 650.0000 mg | ORAL_TABLET | Freq: Four times a day (QID) | ORAL | Status: AC

## 2024-05-04 NOTE — Assessment & Plan Note (Addendum)
 Continue amlodipine 

## 2024-05-04 NOTE — Assessment & Plan Note (Addendum)
 Post-operative anemia Hgb 12.9 on presentation, 10.1 on day of discharge Anticipated to equilibrate over time Recommend recheck in several days to ensure stability or sooner if concerns develop

## 2024-05-04 NOTE — Assessment & Plan Note (Addendum)
 Mechanical fall resulting in hip fracture Orthopedics consulted Underwent IM nail insertion on 2/1 SCDs overnight, start Lovenox  post-operatively (or as per ortho) Pain control with Tylenol , Robaxin , Oxycodone , and Dilaudid  prn TOC team consult for rehab placement She has been seen by PT and is recommended for STR

## 2024-05-04 NOTE — Assessment & Plan Note (Addendum)
 DNR confirmed at the time of admission Patient will need a gold out of facility DNR form at the time of discharge

## 2024-05-04 NOTE — Assessment & Plan Note (Addendum)
 Mentation is at baseline per family Continue donepezil  and memantine  Fall and delirium precautions Patient lives at [facility], should be able to dc to [facility] at [facility] in the next 1-2 days

## 2024-05-04 NOTE — TOC Transition Note (Signed)
 Transition of Care [facility]) - Discharge Note   Patient Details  Name: Shannon Mcintyre MRN: [id number] Date of Birth: [birth year]  Transition [facility]) CM/SW Contact:  Alfonse JONELLE Rex, RN Phone Number: 05/04/2024, 11:30 AM   Clinical Narrative:   DC to SNF- [facility], RM 154. Pts dtr at bedside. PTAR for transport. No further CM needs     Final next level of care: Skilled Nursing Facility Barriers to Discharge: Barriers Resolved   Patient Goals and CMS Choice Patient states their goals for this hospitalization and ongoing recovery are:: return to [facility] rehab, then back to ILF CMS Medicare.gov Compare Post Acute Care list provided to:: Patient Represenative (must comment) Jacquelene Odor  Daughter, Emergency Contact  [phone] (Mobile)) Choice offered to / list presented to : Adult Children [place] ownership interest in [facility].provided to:: Adult Children    Discharge Placement              Patient chooses bed at: [facility] Patient to be transferred to facility by: PTAR Name of family member notified: Celine Odor  Daughter, Emergency Contact  [phone] (Mobile), at bedside Patient and family notified of of transfer: 05/04/24  Discharge Plan and Services Additional resources added to the After Visit Summary for       Post Acute Care Choice: Skilled Nursing Facility [employer])                               Social Drivers of Health (SDOH) Interventions SDOH Screenings   Food Insecurity: No Food Insecurity (05/02/2024)  Housing: Low Risk (05/02/2024)  Transportation Needs: No Transportation Needs (05/03/2024)  Utilities: Not At Risk (05/02/2024)  Social Connections: Socially Isolated (05/03/2024)  Tobacco Use: Low Risk (05/01/2024)     Readmission Risk Interventions    05/03/2024    4:49 PM  Readmission Risk Prevention Plan  Post Dischage Appt Complete  Medication Screening Complete  Transportation Screening Complete

## 2024-05-04 NOTE — Plan of Care (Signed)
" °  Problem: Education: Goal: Knowledge of General Education information will improve Description: Including pain rating scale, medication(s)/side effects and non-pharmacologic comfort measures Outcome: Progressing   Problem: Health Behavior/Discharge Planning: Goal: Ability to manage health-related needs will improve Outcome: Progressing   Problem: Clinical Measurements: Goal: Ability to maintain clinical measurements within normal limits will improve Outcome: Progressing Goal: Will remain free from infection Outcome: Progressing Goal: Diagnostic test results will improve Outcome: Progressing Goal: Respiratory complications will improve Outcome: Progressing Goal: Cardiovascular complication will be avoided Outcome: Progressing   Problem: Activity: Goal: Risk for activity intolerance will decrease Outcome: Progressing   Problem: Nutrition: Goal: Adequate nutrition will be maintained Outcome: Progressing   Problem: Coping: Goal: Level of anxiety will decrease Outcome: Progressing   Problem: Elimination: Goal: Will not experience complications related to bowel motility Outcome: Progressing Goal: Will not experience complications related to urinary retention Outcome: Progressing   Problem: Pain Managment: Goal: General experience of comfort will improve and/or be controlled Outcome: Progressing   Problem: Safety: Goal: Ability to remain free from injury will improve Outcome: Progressing   Problem: Skin Integrity: Goal: Risk for impaired skin integrity will decrease Outcome: Progressing   Problem: Education: Goal: Knowledge of the prescribed therapeutic regimen will improve Outcome: Progressing   Problem: Bowel/Gastric: Goal: Gastrointestinal status for postoperative course will improve Outcome: Progressing   Problem: Cardiac: Goal: Ability to maintain an adequate cardiac output Outcome: Progressing Goal: Will show no evidence of cardiac arrhythmias Outcome:  Progressing   Problem: Nutritional: Goal: Will attain and maintain optimal nutritional status Outcome: Progressing   Problem: Neurological: Goal: Will regain or maintain usual level of consciousness Outcome: Progressing   Problem: Clinical Measurements: Goal: Ability to maintain clinical measurements within normal limits Outcome: Progressing Goal: Postoperative complications will be avoided or minimized Outcome: Progressing   Problem: Respiratory: Goal: Will regain and/or maintain adequate ventilation Outcome: Progressing Goal: Respiratory status will improve Outcome: Progressing   Problem: Skin Integrity: Goal: Demonstrates signs of wound healing without infection Outcome: Progressing   Problem: Urinary Elimination: Goal: Will remain free from infection Outcome: Progressing Goal: Ability to achieve and maintain adequate urine output Outcome: Progressing   Problem: Activity: Goal: Ability to avoid complications of mobility impairment will improve Outcome: Progressing Goal: Ability to tolerate increased activity will improve Outcome: Progressing   Problem: Clinical Measurements: Goal: Postoperative complications will be avoided or minimized Outcome: Progressing   Problem: Pain Management: Goal: Pain level will decrease with appropriate interventions Outcome: Progressing   Problem: Skin Integrity: Goal: Will show signs of wound healing Outcome: Progressing   "

## 2024-09-28 ENCOUNTER — Ambulatory Visit: Payer: Self-pay | Admitting: Physician Assistant
